# Patient Record
Sex: Male | Born: 1945 | Race: Black or African American | Hispanic: No | Marital: Single | Smoking: Never smoker
Health system: Southern US, Community
[De-identification: ages and names within clinical notes are randomized; demographics above are authoritative.]

## PROBLEM LIST (undated history)

## (undated) DIAGNOSIS — M810 Age-related osteoporosis without current pathological fracture: Secondary | ICD-10-CM

## (undated) DIAGNOSIS — D869 Sarcoidosis, unspecified: Secondary | ICD-10-CM

## (undated) DIAGNOSIS — H269 Unspecified cataract: Secondary | ICD-10-CM

## (undated) DIAGNOSIS — K449 Diaphragmatic hernia without obstruction or gangrene: Secondary | ICD-10-CM

## (undated) DIAGNOSIS — N200 Calculus of kidney: Secondary | ICD-10-CM

## (undated) DIAGNOSIS — Z9289 Personal history of other medical treatment: Secondary | ICD-10-CM

## (undated) DIAGNOSIS — R059 Cough, unspecified: Secondary | ICD-10-CM

## (undated) DIAGNOSIS — K5790 Diverticulosis of intestine, part unspecified, without perforation or abscess without bleeding: Secondary | ICD-10-CM

## (undated) DIAGNOSIS — R05 Cough: Secondary | ICD-10-CM

## (undated) DIAGNOSIS — Q5310 Unspecified undescended testicle, unilateral: Secondary | ICD-10-CM

## (undated) DIAGNOSIS — I10 Essential (primary) hypertension: Secondary | ICD-10-CM

## (undated) DIAGNOSIS — K219 Gastro-esophageal reflux disease without esophagitis: Secondary | ICD-10-CM

## (undated) DIAGNOSIS — D649 Anemia, unspecified: Secondary | ICD-10-CM

## (undated) DIAGNOSIS — E785 Hyperlipidemia, unspecified: Secondary | ICD-10-CM

## (undated) DIAGNOSIS — G5602 Carpal tunnel syndrome, left upper limb: Secondary | ICD-10-CM

## (undated) DIAGNOSIS — J310 Chronic rhinitis: Secondary | ICD-10-CM

## (undated) DIAGNOSIS — R0789 Other chest pain: Secondary | ICD-10-CM

## (undated) DIAGNOSIS — J849 Interstitial pulmonary disease, unspecified: Secondary | ICD-10-CM

## (undated) DIAGNOSIS — I4891 Unspecified atrial fibrillation: Secondary | ICD-10-CM

## (undated) DIAGNOSIS — M1990 Unspecified osteoarthritis, unspecified site: Secondary | ICD-10-CM

## (undated) DIAGNOSIS — R0600 Dyspnea, unspecified: Secondary | ICD-10-CM

## (undated) HISTORY — PX: CERVICAL FUSION: SHX112

## (undated) HISTORY — DX: Diaphragmatic hernia without obstruction or gangrene: K44.9

## (undated) HISTORY — DX: Hyperlipidemia, unspecified: E78.5

## (undated) HISTORY — DX: Unspecified cataract: H26.9

## (undated) HISTORY — DX: Sarcoidosis, unspecified: D86.9

## (undated) HISTORY — DX: Unspecified atrial fibrillation: I48.91

## (undated) HISTORY — DX: Chronic rhinitis: J31.0

## (undated) HISTORY — DX: Interstitial pulmonary disease, unspecified: J84.9

## (undated) HISTORY — DX: Unspecified osteoarthritis, unspecified site: M19.90

## (undated) HISTORY — DX: Cough: R05

## (undated) HISTORY — DX: Diverticulosis of intestine, part unspecified, without perforation or abscess without bleeding: K57.90

## (undated) HISTORY — DX: Gastro-esophageal reflux disease without esophagitis: K21.9

## (undated) HISTORY — DX: Other chest pain: R07.89

## (undated) HISTORY — DX: Age-related osteoporosis without current pathological fracture: M81.0

## (undated) HISTORY — DX: Anemia, unspecified: D64.9

## (undated) HISTORY — DX: Personal history of other medical treatment: Z92.89

## (undated) HISTORY — DX: Cough, unspecified: R05.9

## (undated) HISTORY — PX: INGUINAL HERNIA REPAIR: SUR1180

## (undated) HISTORY — DX: Carpal tunnel syndrome, left upper limb: G56.02

## (undated) HISTORY — DX: Essential (primary) hypertension: I10

## (undated) HISTORY — DX: Calculus of kidney: N20.0

## (undated) HISTORY — DX: Unspecified undescended testicle, unilateral: Q53.10

---

## 1994-11-13 HISTORY — PX: OTHER SURGICAL HISTORY: SHX169

## 1998-06-06 ENCOUNTER — Ambulatory Visit (HOSPITAL_COMMUNITY): Admission: RE | Admit: 1998-06-06 | Discharge: 1998-06-06 | Payer: Self-pay | Admitting: Neurology

## 1998-06-08 ENCOUNTER — Ambulatory Visit (HOSPITAL_COMMUNITY): Admission: RE | Admit: 1998-06-08 | Discharge: 1998-06-08 | Payer: Self-pay | Admitting: Neurology

## 1998-06-13 DIAGNOSIS — D869 Sarcoidosis, unspecified: Secondary | ICD-10-CM

## 1998-06-13 HISTORY — DX: Sarcoidosis, unspecified: D86.9

## 1998-07-07 ENCOUNTER — Ambulatory Visit: Admission: RE | Admit: 1998-07-07 | Discharge: 1998-07-07 | Payer: Self-pay | Admitting: Internal Medicine

## 1998-10-13 ENCOUNTER — Encounter: Payer: Self-pay | Admitting: Surgery

## 1998-10-13 ENCOUNTER — Ambulatory Visit (HOSPITAL_BASED_OUTPATIENT_CLINIC_OR_DEPARTMENT_OTHER): Admission: RE | Admit: 1998-10-13 | Discharge: 1998-10-13 | Payer: Self-pay | Admitting: Surgery

## 2000-01-28 ENCOUNTER — Ambulatory Visit (HOSPITAL_COMMUNITY): Admission: RE | Admit: 2000-01-28 | Discharge: 2000-01-28 | Payer: Self-pay | Admitting: Neurology

## 2000-01-28 ENCOUNTER — Encounter: Payer: Self-pay | Admitting: Neurology

## 2001-02-09 ENCOUNTER — Ambulatory Visit (HOSPITAL_COMMUNITY): Admission: RE | Admit: 2001-02-09 | Discharge: 2001-02-09 | Payer: Self-pay | Admitting: Neurology

## 2001-02-09 ENCOUNTER — Encounter: Payer: Self-pay | Admitting: Neurology

## 2002-06-02 ENCOUNTER — Ambulatory Visit (HOSPITAL_COMMUNITY): Admission: RE | Admit: 2002-06-02 | Discharge: 2002-06-02 | Payer: Self-pay | Admitting: Neurology

## 2002-06-02 ENCOUNTER — Encounter: Payer: Self-pay | Admitting: Neurology

## 2003-05-30 ENCOUNTER — Ambulatory Visit (HOSPITAL_COMMUNITY): Admission: RE | Admit: 2003-05-30 | Discharge: 2003-05-30 | Payer: Self-pay | Admitting: Neurology

## 2003-05-30 ENCOUNTER — Encounter: Payer: Self-pay | Admitting: Neurology

## 2003-12-31 ENCOUNTER — Ambulatory Visit (HOSPITAL_COMMUNITY): Admission: RE | Admit: 2003-12-31 | Discharge: 2003-12-31 | Payer: Self-pay | Admitting: Internal Medicine

## 2004-09-13 ENCOUNTER — Ambulatory Visit: Payer: Self-pay | Admitting: Internal Medicine

## 2004-09-16 ENCOUNTER — Encounter (INDEPENDENT_AMBULATORY_CARE_PROVIDER_SITE_OTHER): Payer: Self-pay | Admitting: Specialist

## 2004-09-16 ENCOUNTER — Ambulatory Visit (HOSPITAL_COMMUNITY): Admission: RE | Admit: 2004-09-16 | Discharge: 2004-09-16 | Payer: Self-pay | Admitting: Surgery

## 2004-09-16 ENCOUNTER — Ambulatory Visit (HOSPITAL_BASED_OUTPATIENT_CLINIC_OR_DEPARTMENT_OTHER): Admission: RE | Admit: 2004-09-16 | Discharge: 2004-09-16 | Payer: Self-pay | Admitting: Surgery

## 2004-09-16 HISTORY — PX: LIPOMA EXCISION: SHX5283

## 2004-12-12 ENCOUNTER — Ambulatory Visit: Payer: Self-pay | Admitting: Internal Medicine

## 2005-01-23 ENCOUNTER — Ambulatory Visit: Payer: Self-pay | Admitting: Internal Medicine

## 2005-03-07 ENCOUNTER — Ambulatory Visit: Payer: Self-pay | Admitting: Internal Medicine

## 2005-04-20 ENCOUNTER — Ambulatory Visit: Payer: Self-pay | Admitting: Internal Medicine

## 2005-06-03 ENCOUNTER — Ambulatory Visit (HOSPITAL_COMMUNITY): Admission: RE | Admit: 2005-06-03 | Discharge: 2005-06-03 | Payer: Self-pay | Admitting: Neurology

## 2005-06-06 ENCOUNTER — Ambulatory Visit: Payer: Self-pay | Admitting: Internal Medicine

## 2005-07-20 ENCOUNTER — Ambulatory Visit: Payer: Self-pay | Admitting: Internal Medicine

## 2005-08-17 ENCOUNTER — Ambulatory Visit: Payer: Self-pay | Admitting: Internal Medicine

## 2005-09-28 ENCOUNTER — Ambulatory Visit: Payer: Self-pay | Admitting: Internal Medicine

## 2005-10-30 ENCOUNTER — Ambulatory Visit: Payer: Self-pay | Admitting: Internal Medicine

## 2005-11-28 ENCOUNTER — Ambulatory Visit: Payer: Self-pay | Admitting: Internal Medicine

## 2005-12-28 ENCOUNTER — Ambulatory Visit: Payer: Self-pay | Admitting: Gastroenterology

## 2006-01-10 ENCOUNTER — Ambulatory Visit: Payer: Self-pay | Admitting: Internal Medicine

## 2006-01-12 ENCOUNTER — Ambulatory Visit: Payer: Self-pay | Admitting: Gastroenterology

## 2006-01-12 HISTORY — PX: COLONOSCOPY: SHX174

## 2006-01-23 LAB — HM COLONOSCOPY: HM Colonoscopy: NORMAL

## 2006-01-26 ENCOUNTER — Ambulatory Visit: Payer: Self-pay | Admitting: Gastroenterology

## 2006-02-13 ENCOUNTER — Ambulatory Visit: Payer: Self-pay | Admitting: Gastroenterology

## 2006-02-13 ENCOUNTER — Encounter (INDEPENDENT_AMBULATORY_CARE_PROVIDER_SITE_OTHER): Payer: Self-pay | Admitting: Specialist

## 2006-02-27 ENCOUNTER — Ambulatory Visit: Payer: Self-pay | Admitting: Internal Medicine

## 2006-03-21 ENCOUNTER — Ambulatory Visit (HOSPITAL_COMMUNITY): Admission: RE | Admit: 2006-03-21 | Discharge: 2006-03-21 | Payer: Self-pay | Admitting: Gastroenterology

## 2006-04-03 ENCOUNTER — Ambulatory Visit: Payer: Self-pay | Admitting: Internal Medicine

## 2006-04-11 ENCOUNTER — Ambulatory Visit: Payer: Self-pay | Admitting: Internal Medicine

## 2006-05-22 ENCOUNTER — Ambulatory Visit: Payer: Self-pay | Admitting: Gastroenterology

## 2006-07-02 ENCOUNTER — Ambulatory Visit: Payer: Self-pay | Admitting: Internal Medicine

## 2006-08-13 ENCOUNTER — Ambulatory Visit: Payer: Self-pay | Admitting: Internal Medicine

## 2006-09-25 ENCOUNTER — Ambulatory Visit: Payer: Self-pay | Admitting: Internal Medicine

## 2006-10-25 ENCOUNTER — Ambulatory Visit: Payer: Self-pay | Admitting: Internal Medicine

## 2006-12-06 ENCOUNTER — Ambulatory Visit: Payer: Self-pay | Admitting: Internal Medicine

## 2007-01-04 ENCOUNTER — Ambulatory Visit: Payer: Self-pay | Admitting: Internal Medicine

## 2007-04-04 ENCOUNTER — Ambulatory Visit: Payer: Self-pay | Admitting: Internal Medicine

## 2007-05-30 ENCOUNTER — Ambulatory Visit (HOSPITAL_COMMUNITY): Admission: RE | Admit: 2007-05-30 | Discharge: 2007-05-30 | Payer: Self-pay | Admitting: Neurology

## 2007-09-12 DIAGNOSIS — R059 Cough, unspecified: Secondary | ICD-10-CM | POA: Insufficient documentation

## 2007-09-12 DIAGNOSIS — D869 Sarcoidosis, unspecified: Secondary | ICD-10-CM | POA: Insufficient documentation

## 2007-09-12 DIAGNOSIS — J8410 Pulmonary fibrosis, unspecified: Secondary | ICD-10-CM | POA: Insufficient documentation

## 2007-09-12 DIAGNOSIS — R05 Cough: Secondary | ICD-10-CM

## 2007-09-12 DIAGNOSIS — J310 Chronic rhinitis: Secondary | ICD-10-CM | POA: Insufficient documentation

## 2008-04-17 ENCOUNTER — Ambulatory Visit: Payer: Self-pay | Admitting: Internal Medicine

## 2010-10-31 ENCOUNTER — Ambulatory Visit: Payer: Self-pay | Admitting: Internal Medicine

## 2010-10-31 DIAGNOSIS — M1990 Unspecified osteoarthritis, unspecified site: Secondary | ICD-10-CM | POA: Insufficient documentation

## 2010-10-31 DIAGNOSIS — I10 Essential (primary) hypertension: Secondary | ICD-10-CM

## 2010-10-31 DIAGNOSIS — D509 Iron deficiency anemia, unspecified: Secondary | ICD-10-CM

## 2010-10-31 LAB — CONVERTED CEMR LAB
ALT: 16 units/L (ref 0–53)
AST: 22 units/L (ref 0–37)
Albumin: 4.1 g/dL (ref 3.5–5.2)
BUN: 10 mg/dL (ref 6–23)
Bilirubin Urine: NEGATIVE
Chloride: 103 meq/L (ref 96–112)
Direct LDL: 163.8 mg/dL
Eosinophils Relative: 1.6 % (ref 0.0–5.0)
GFR calc non Af Amer: 94.82 mL/min (ref >60.00)
Glucose, Bld: 86 mg/dL (ref 70–99)
HCT: 35.2 % — ABNORMAL LOW (ref 39.0–52.0)
Hemoglobin: 11.3 g/dL — ABNORMAL LOW (ref 13.0–17.0)
Leukocytes, UA: NEGATIVE
Lymphs Abs: 0.9 10*3/uL (ref 0.7–4.0)
MCV: 73.8 fL — ABNORMAL LOW (ref 78.0–100.0)
Monocytes Absolute: 0.4 10*3/uL (ref 0.1–1.0)
Monocytes Relative: 9.1 % (ref 3.0–12.0)
Neutro Abs: 2.9 10*3/uL (ref 1.4–7.7)
Nitrite: NEGATIVE
Platelets: 217 10*3/uL (ref 150.0–400.0)
Potassium: 4 meq/L (ref 3.5–5.1)
Sed Rate: 11 mm/hr (ref 0–22)
Sodium: 141 meq/L (ref 135–145)
Specific Gravity, Urine: 1.02 (ref 1.000–1.030)
TSH: 0.66 microintl units/mL (ref 0.35–5.50)
Total Bilirubin: 0.9 mg/dL (ref 0.3–1.2)
Total Protein, Urine: NEGATIVE mg/dL
Total Protein: 7.6 g/dL (ref 6.0–8.3)
Transferrin: 404.5 mg/dL — ABNORMAL HIGH (ref 212.0–360.0)
VLDL: 37 mg/dL (ref 0.0–40.0)
Vitamin B-12: 281 pg/mL (ref 211–911)
WBC: 4.3 10*3/uL — ABNORMAL LOW (ref 4.5–10.5)
pH: 6.5 (ref 5.0–8.0)

## 2010-11-01 ENCOUNTER — Encounter: Payer: Self-pay | Admitting: Internal Medicine

## 2010-11-16 ENCOUNTER — Encounter: Payer: Self-pay | Admitting: Internal Medicine

## 2010-11-17 ENCOUNTER — Ambulatory Visit
Admission: RE | Admit: 2010-11-17 | Discharge: 2010-11-17 | Payer: Self-pay | Source: Home / Self Care | Attending: Internal Medicine | Admitting: Internal Medicine

## 2010-11-17 ENCOUNTER — Other Ambulatory Visit: Payer: Self-pay | Admitting: Internal Medicine

## 2010-11-17 ENCOUNTER — Telehealth: Payer: Self-pay | Admitting: Internal Medicine

## 2010-11-17 ENCOUNTER — Encounter: Payer: Self-pay | Admitting: Internal Medicine

## 2010-11-17 DIAGNOSIS — N401 Enlarged prostate with lower urinary tract symptoms: Secondary | ICD-10-CM

## 2010-11-17 DIAGNOSIS — N138 Other obstructive and reflux uropathy: Secondary | ICD-10-CM | POA: Insufficient documentation

## 2010-11-17 DIAGNOSIS — R972 Elevated prostate specific antigen [PSA]: Secondary | ICD-10-CM | POA: Insufficient documentation

## 2010-11-17 LAB — CBC WITH DIFFERENTIAL/PLATELET
Basophils Absolute: 0 10*3/uL (ref 0.0–0.1)
Basophils Relative: 0.5 % (ref 0.0–3.0)
Eosinophils Absolute: 0.1 10*3/uL (ref 0.0–0.7)
Eosinophils Relative: 2.6 % (ref 0.0–5.0)
HCT: 35.4 % — ABNORMAL LOW (ref 39.0–52.0)
Hemoglobin: 11.4 g/dL — ABNORMAL LOW (ref 13.0–17.0)
Lymphocytes Relative: 18.1 % (ref 12.0–46.0)
Lymphs Abs: 1 10*3/uL (ref 0.7–4.0)
MCHC: 32.2 g/dL (ref 30.0–36.0)
MCV: 74.2 fl — ABNORMAL LOW (ref 78.0–100.0)
Monocytes Absolute: 0.4 10*3/uL (ref 0.1–1.0)
Monocytes Relative: 8 % (ref 3.0–12.0)
Neutro Abs: 3.7 10*3/uL (ref 1.4–7.7)
Neutrophils Relative %: 70.8 % (ref 43.0–77.0)
Platelets: 221 10*3/uL (ref 150.0–400.0)
RBC: 4.77 Mil/uL (ref 4.22–5.81)
RDW: 19.1 % — ABNORMAL HIGH (ref 11.5–14.6)
WBC: 5.3 10*3/uL (ref 4.5–10.5)

## 2010-11-17 LAB — CONVERTED CEMR LAB

## 2010-11-17 LAB — PSA: PSA: 4.22 ng/mL — ABNORMAL HIGH (ref 0.10–4.00)

## 2010-12-15 NOTE — Letter (Signed)
Summary: Results Follow-up Letter  [facility]  [address] [place], [place] [address]   Phone: [phone]  Fax: [phone]    11/17/2010  [address] [place], [place]  [address]  Dear Richard Blake,   The following are the results of your recent test(s):  Test     Result     CBC       mild anemia Prostate     slightly elevated   _________________________________________________________  Please call for an appointment as directed _________________________________________________________ _________________________________________________________ _________________________________________________________  Sincerely,  Sanda Linger MD [facility]

## 2010-12-15 NOTE — Letter (Signed)
Summary: Lipid Letter  [facility]  [address] [place], [place] [address]   Phone: [phone]  Fax: [phone]    11/01/2010  Richard Blake [address] [place], [place]  [address]  Dear Richard Blake:  We have carefully reviewed your last lipid profile from  and the results are noted below with a summary of recommendations for lipid management.    Cholesterol:       260     Goal: <200 too high   HDL "good" Cholesterol:   57.84     Goal: >40 good   LDL "bad" Cholesterol:   164     Goal: <130 too high   Triglycerides:       185.0     Goal: <150 too high        TLC Diet (Therapeutic Lifestyle Change): Saturated Fats & Transfatty acids should be kept < 7% of total calories ***Reduce Saturated Fats Polyunstaurated Fat can be up to 10% of total calories Monounsaturated Fat Fat can be up to 20% of total calories Total Fat should be no greater than 25-35% of total calories Carbohydrates should be 50-60% of total calories Protein should be approximately 15% of total calories Fiber should be at least 20-30 grams a day ***Increased fiber may help lower LDL Total Cholesterol should be < 200mg /day Consider adding plant stanol/sterols to diet (example: Benacol spread) ***A higher intake of unsaturated fat may reduce Triglycerides and Increase HDL    Adjunctive Measures (may lower LIPIDS and reduce risk of Heart Attack) include: Aerobic Exercise (20-30 minutes 3-4 times a week) Limit Alcohol Consumption Weight Reduction Aspirin 75-81 mg a day by mouth (if not allergic or contraindicated) Dietary Fiber 20-30 grams a day by mouth     Current Medications: 1)    Meloxicam 15 Mg Tabs (Meloxicam)  If you have any questions, please call. We appreciate being able to work with you.   Sincerely,    [facility] [name] MD

## 2010-12-15 NOTE — Letter (Signed)
Summary: [facility]  [facility]   Imported By: Maryln Gottron 11/29/2010 10:32:18  _____________________________________________________________________  External Attachment:    Type:   Image     Comment:   External Document

## 2010-12-15 NOTE — Assessment & Plan Note (Signed)
Summary: 2 WK ROV /NWS  #   Vital Signs:  Patient profile:   65 year old male Height:      68 inches Weight:      175 pounds BMI:     26.70 O2 Sat:      97 % on Room air Temp:     98.4 degrees F oral Pulse rate:   71 / minute Pulse rhythm:   regular Resp:     16 per minute BP sitting:   150 / 98  (left arm) Cuff size:   regular  Vitals Entered By: [name] CMA (November 17, 2010 11:07 AM)  Nutrition Counseling: Patient's BMI is greater than 25 and therefore counseled on weight management options.  O2 Flow:  Room air  Primary Care Provider:  [name] MD   History of Present Illness:  Follow-Up Visit      This is a 65 year old man who presents for Follow-up visit.  The patient denies chest pain, palpitations, dizziness, syncope, edema, SOB, DOE, PND, and orthopnea.  Since the last visit the patient notes no new problems or concerns.  The patient reports taking meds as prescribed, monitoring BP, and dietary compliance.  When questioned about possible medication side effects, the patient notes none.    Preventive Screening-Counseling & Management  Alcohol-Tobacco     Alcohol drinks/day: 0     Alcohol Counseling: not indicated; patient does not drink     Smoking Status: never     Tobacco Counseling: not indicated; no tobacco use  Hep-HIV-STD-Contraception     Hepatitis Risk: no risk noted     HIV Risk: no risk noted     STD Risk: no risk noted      Sexual History:  currently monogamous.        Drug Use:  no.        Blood Transfusions:  no.    Clinical Review Panels:  Prevention   Last Colonoscopy:  Normal (01/23/2006)  Immunizations   Last Tetanus Booster:  Tdap (10/31/2010)   Last Flu Vaccine:  Fluvax 3+ (10/31/2010)   Last Pneumovax:  Historical (12/14/2000)  Lipid Management   Cholesterol:  260 (10/31/2010)   HDL (good cholesterol):  55.50 (10/31/2010)  Diabetes Management   Creatinine:  1.0 (10/31/2010)   Last Flu Vaccine:  Fluvax 3+  (10/31/2010)   Last Pneumovax:  Historical (12/14/2000)  CBC   WBC:  4.3 (10/31/2010)   RBC:  4.76 (10/31/2010)   Hgb:  11.3 (10/31/2010)   Hct:  35.2 (10/31/2010)   Platelets:  217.0 (10/31/2010)   MCV  73.8 (10/31/2010)   MCHC  32.0 (10/31/2010)   RDW  18.8 (10/31/2010)   PMN:  68.5 (10/31/2010)   Lymphs:  20.1 (10/31/2010)   Monos:  9.1 (10/31/2010)   Eosinophils:  1.6 (10/31/2010)   Basophil:  0.7 (10/31/2010)  Complete Metabolic Panel   Glucose:  86 (10/31/2010)   Sodium:  141 (10/31/2010)   Potassium:  4.0 (10/31/2010)   Chloride:  103 (10/31/2010)   CO2:  31 (10/31/2010)   BUN:  10 (10/31/2010)   Creatinine:  1.0 (10/31/2010)   Albumin:  4.1 (10/31/2010)   Total Protein:  7.6 (10/31/2010)   Calcium:  9.2 (10/31/2010)   Total Bili:  0.9 (10/31/2010)   Alk Phos:  75 (10/31/2010)   SGPT (ALT):  16 (10/31/2010)   SGOT (AST):  22 (10/31/2010)   Medications Prior to Update: 1)  Meloxicam 15 Mg Tabs (Meloxicam)  Current  Medications (verified): 1)  Meloxicam 15 Mg Tabs (Meloxicam) 2)  B-12 250 Mcg Tabs (Cyanocobalamin) .... Take 1 Tablet By Mouth Once A Day 3)  Iron 65mg  .... One By Mouth Three Times A Day With Food 4)  Azor 5-20 Mg Tabs (Amlodipine-Olmesartan) .... One By Mouth Once Daily For High Blood Pressure  Allergies (verified): No Known Drug Allergies  Past History:  Family History: Last updated: 10/31/2010 asthma in mother negative for sarcoidosis Family History of Arthritis Family History Diabetes 1st degree relative Family History Hypertension Family History of Prostate CA 1st degree relative <50  Social History: Last updated: 10/31/2010 never smoked Retired Married Never Smoked Alcohol use-no Drug use-no Regular exercise-no  Risk Factors: Alcohol Use: 0 (11/17/2010) Exercise: no (10/31/2010)  Risk Factors: Smoking Status: never (11/17/2010)  Past Medical History: Current Problems:  RHINITIS (ICD-472.0) COUGH, CHRONIC  (ICD-786.2) 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 asthma in mother negative for sarcoidosis Family History of Arthritis Family History Diabetes 1st degree relative Family History Hypertension Family History of Prostate CA 1st degree relative <50  Social History: Reviewed history from 10/31/2010 and no changes required. never smoked Retired Married Never Smoked Alcohol use-no Drug use-no Regular exercise-no  Review of Systems  The patient denies anorexia, fever, weight loss, weight gain, chest pain, syncope, dyspnea on exertion, peripheral edema, prolonged cough, headaches, hemoptysis, abdominal pain, melena, hematochezia, severe indigestion/heartburn, hematuria, suspicious skin lesions, abnormal bleeding, enlarged lymph nodes, and angioedema.   GU:  Denies decreased libido, discharge, dysuria, erectile dysfunction, genital sores, hematuria, incontinence, nocturia, urinary frequency, and urinary hesitancy. Heme:  Denies abnormal bruising, bleeding, enlarge lymph nodes, fevers, pallor, and skin discoloration.  Physical Exam  General:  alert, well-developed, well-nourished, well-hydrated, appropriate dress, normal appearance, healthy-appearing, cooperative to examination, and good hygiene.   Head:  normocephalic, atraumatic, no abnormalities observed, and no abnormalities palpated.   Eyes:  vision grossly intact, pupils equal, pupils round, pupils reactive to light, and no injection.   Mouth:  Oral mucosa and oropharynx without lesions or exudates.  Teeth in good  repair. Neck:  supple, full ROM, no masses, no thyromegaly, no thyroid nodules or tenderness, no JVD, normal carotid upstroke, no carotid bruits, no cervical lymphadenopathy, and no neck tenderness.   Lungs:  normal respiratory effort, no intercostal retractions, no accessory muscle use, normal breath sounds, no dullness, no fremitus, no crackles, and no wheezes.   Heart:  normal rate, regular rhythm, no murmur, no gallop, no rub, and no JVD.   Abdomen:  soft, non-tender, normal bowel sounds, no distention, no masses, no guarding, no rigidity, no rebound tenderness, no abdominal hernia, no inguinal hernia, no hepatomegaly, and no splenomegaly.   Rectal:  No external abnormalities noted. Normal sphincter tone. No rectal masses or tenderness. Genitalia:  uncircumcised, no hydrocele, no varicocele, no scrotal masses, no testicular masses or atrophy, no cutaneous lesions, and no urethral discharge.  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 Prostate:  no nodules, no asymmetry, no induration, and 2+ enlarged.   Msk:  normal ROM, no joint tenderness, no joint swelling, no joint warmth, no redness over joints, no joint instability, no crepitation, no muscle  atrophy, enlarged MCP joints, enlarged PIP joints, and enlarged DIP joints.   Pulses:  R and L carotid,radial,femoral,dorsalis pedis and posterior tibial pulses are full and equal bilaterally Extremities:  No clubbing, cyanosis, edema, or deformity noted with normal full range of motion of all joints.   Neurologic:  No cranial nerve deficits noted. Station and gait are normal. Plantar reflexes are down-going bilaterally. DTRs are symmetrical throughout. Sensory, motor and coordinative functions appear intact. Skin:  turgor normal, color normal, no rashes, no suspicious lesions, no ecchymoses, no petechiae, no purpura, no ulcerations, and no edema.   Cervical Nodes:  no anterior cervical adenopathy and no posterior cervical adenopathy.    Axillary Nodes:  no R axillary adenopathy and no L axillary adenopathy.   Inguinal Nodes:  no R inguinal adenopathy and no L inguinal adenopathy.   Psych:  Oriented X3, memory intact for recent and remote, normally interactive, good eye contact, not anxious appearing, not depressed appearing, not agitated, not suicidal, and not homicidal.     Impression & Recommendations:  Problem # 1:  HYPERTROPHY PROSTATE W/UR OBST & OTH LUTS (ICD-600.01) Assessment New  Orders: Venipuncture (47829) TLB-CBC Platelet - w/Differential (85025-CBCD) TLB-PSA (Prostate Specific Antigen) (84153-PSA) DRE (F6213) Prostate / PSA (Medicare) (Y8657)  Problem # 2:  ANEMIA-NOS (ICD-285.9) Assessment: Unchanged I looked at old records today and it shows that his anemia dates back to 2007 and has been stable, his small intestine biopsy was negative for celiac disease His updated medication list for this problem includes:    B-12 250 Mcg Tabs (Cyanocobalamin) .Marland Kitchen... Take 1 tablet by mouth once a day  Orders: Venipuncture (84696) TLB-CBC Platelet - w/Differential (85025-CBCD) TLB-PSA (Prostate Specific Antigen) (84153-PSA) Hemoccult Guaiac-1 spec.(in office) (82270) DRE (E9528) Prostate / PSA (Medicare) (U1324)  Problem # 3:  HYPERTENSION (ICD-401.9) Assessment: Deteriorated  His updated medication list for this problem includes:    Azor 5-20 Mg Tabs (Amlodipine-olmesartan) ..... One by mouth once daily for high blood pressure  BP today: 150/98 Prior BP: 136/88 (10/31/2010)  Labs Reviewed: K+: 4.0 (10/31/2010) Creat: : 1.0 (10/31/2010)   Chol: 260 (10/31/2010)   HDL: 55.50 (10/31/2010)   TG: 185.0 (10/31/2010)  Complete Medication List: 1)  Meloxicam 15 Mg Tabs (Meloxicam) 2)  B-12 250 Mcg Tabs (Cyanocobalamin) .... Take 1 tablet by mouth once a day 3)  Iron 65mg   .... One by mouth three times a day with food 4)  Azor 5-20 Mg Tabs (Amlodipine-olmesartan) .... One by mouth once daily for high blood  pressure  Colorectal Screening:  Current Recommendations:    Hemoccult: NEG X 1 today  Colonoscopy Results:    Date of Exam: 01/23/2006    Results: Normal  PSA Screening:    Reviewed PSA screening recommendations: PSA ordered  Immunization & Chemoprophylaxis:    Tetanus vaccine: Tdap  (10/31/2010)    Influenza vaccine: Fluvax 3+  (10/31/2010)    Pneumovax: Historical  (12/14/2000)  Patient Instructions: 1)  Please schedule a follow-up appointment in 2 months. 2)  It is important that you exercise regularly at least 20 minutes 5 times a week. If you develop chest pain, have severe difficulty breathing, or feel very tired , stop exercising immediately and seek medical attention. 3)  You need to lose weight. Consider a lower calorie diet and regular exercise.  4)  Check your Blood Pressure regularly. If it is above 140/90: you should make an appointment. Prescriptions: AZOR 5-20 MG TABS (AMLODIPINE-OLMESARTAN) one by mouth once daily for high blood  pressure  #77 x 0   Entered and Authorized by:   [name] MD   Signed by:   [name] MD on 11/17/2010   Method used:   Samples Given   RxID:   [id number]    Orders Added: 1)  Venipuncture [33295] 2)  TLB-CBC Platelet - w/Differential [85025-CBCD] 3)  TLB-PSA (Prostate Specific Antigen) [18841-YSA] 4)  Hemoccult Guaiac-1 spec.(in office) [82270] 5)  DRE [G0102] 6)  Prostate / PSA (Medicare) [G0103] 7)  Est. Patient Level IV [63016]

## 2010-12-15 NOTE — Progress Notes (Signed)
Summary: RESULTS  Phone Note Outgoing Call   Summary of Call: his psa is high Initial call taken by: [name] MD,  November 17, 2010 4:30 PM  Follow-up for Phone Call        he has been referred to a urologist Follow-up by: [name] MD,  November 17, 2010 4:31 PM  Additional Follow-up for Phone Call Additional follow up Details #1::        left mess to call office back.....................Marland KitchenLamar Sprinkles, CMA  November 18, 2010 9:28 AM   Pt informed  Additional Follow-up by: Lamar Sprinkles, CMA,  November 18, 2010 9:39 AM  New Problems: PROSTATE SPECIFIC ANTIGEN, ELEVATED (ICD-790.93)   New Problems: PROSTATE SPECIFIC ANTIGEN, ELEVATED (ICD-790.93)

## 2010-12-15 NOTE — Assessment & Plan Note (Signed)
Summary: new / [facility] / # / cd   Vital Signs:  Patient profile:   65 year old male Height:      68 inches Weight:      178.50 pounds BMI:     27.24 O2 Sat:      99 % on Room air Temp:     97.9 degrees F oral Pulse rate:   75 / minute Pulse rhythm:   regular Resp:     16 per minute BP sitting:   136 / 88  (left arm) Cuff size:   large  Vitals Entered By: [name] CMA (October 31, 2010 3:05 PM)  Nutrition Counseling: Patient's BMI is greater than 25 and therefore counseled on weight management options.  O2 Flow:  Room air  Primary Care Provider:  [name] MD   History of Present Illness: New to me he tells me that his orthopedist wants for him to have a PCP for ongoing issues. The pt. believes that there is fluid in his knees and bursa sacs that is being absorbed into his body and that he can smell it, taste it, feels it and that it has a sweet smell like a hospital disenfectant. He says his ortho. had no explanation for the symptoms and told him that he had OA.  Preventive Screening-Counseling & Management  Alcohol-Tobacco     Alcohol drinks/day: 0     Alcohol Counseling: not indicated; patient does not drink     Smoking Status: never     Tobacco Counseling: not indicated; no tobacco use  Caffeine-Diet-Exercise     Does Patient Exercise: no  Hep-HIV-STD-Contraception     Hepatitis Risk: no risk noted     HIV Risk: no risk noted     STD Risk: no risk noted      Sexual History:  currently monogamous.        Drug Use:  no.        Blood Transfusions:  no.    Problems Prior to Update: 1)  Anemia-nos  (ICD-285.9) 2)  Osteoarthritis  (ICD-715.90) 3)  Hypertension  (ICD-401.9) 4)  Rhinitis  (ICD-472.0) 5)  Cough, Chronic  (ICD-786.2) 6)  Interstitial Lung Disease  (ICD-515) 7)  Sarcoidosis  (ICD-135)  Medications Prior to Update: 1)  Prednisone 5 Mg  Tabs (Prednisone) .... Once Daily  Current Medications (verified): 1)  Meloxicam 15 Mg Tabs  (Meloxicam)  Allergies (verified): No Known Drug Allergies  Past History:  Past Medical History: Current Problems:  RHINITIS (ICD-472.0) COUGH, CHRONIC (ICD-786.2) INTERSTITIAL LUNG DISEASE (ICD-515) SARCOIDOSIS (ICD-135) diagnosed 8/99 by transbronchial biopsy, taking daily prednisone since November 08 but on and off it since 1999 with cough every time he relapses off it for more than sev weeks   Hypertension Osteoarthritis Anemia-NOS  Family History: asthma in mother negative for sarcoidosis Family History of Arthritis Family History Diabetes 1st degree relative Family History Hypertension Family History of Prostate CA 1st degree relative <50  Social History: never smoked Retired Married Never Smoked Alcohol use-no Drug use-no Regular exercise-no Hepatitis Risk:  no risk noted HIV Risk:  no risk noted STD Risk:  no risk noted Sexual History:  currently monogamous Blood Transfusions:  no Drug Use:  no Does Patient Exercise:  no  Review of Systems  The patient denies anorexia, fever, weight loss, weight gain, chest pain, syncope, dyspnea on exertion, peripheral edema, prolonged cough, headaches, hemoptysis, abdominal pain, hematuria, muscle weakness, suspicious skin lesions, difficulty walking, depression, unusual weight change,  and enlarged lymph nodes.   Resp:  Denies chest pain with inspiration, cough, coughing up blood, excessive snoring, hypersomnolence, morning headaches, pleuritic, shortness of breath, sputum productive, and wheezing. MS:  Complains of joint pain; denies joint redness, joint swelling, loss of strength, low back pain, muscle aches, muscle, cramps, muscle weakness, stiffness, and thoracic pain. Derm:  Denies changes in nail beds, dryness, flushing, hair loss, insect bite(s), itching, lesion(s), and rash. Heme:  Denies abnormal bruising, bleeding, enlarge lymph nodes, fevers, pallor, and skin discoloration.  Physical Exam  General:  alert,  well-developed, well-nourished, well-hydrated, appropriate dress, normal appearance, healthy-appearing, cooperative to examination, and good hygiene.   Head:  normocephalic, atraumatic, no abnormalities observed, and no abnormalities palpated.   Eyes:  vision grossly intact, pupils equal, pupils round, pupils reactive to light, and no injection.   Ears:  R ear normal and L ear normal.   Nose:  External nasal examination shows no deformity or inflammation. Nasal mucosa are pink and moist without lesions or exudates. Mouth:  Oral mucosa and oropharynx without lesions or exudates.  Teeth in good repair. Neck:  supple, full ROM, no masses, no thyromegaly, no thyroid nodules or tenderness, no JVD, normal carotid upstroke, no carotid bruits, no cervical lymphadenopathy, and no neck tenderness.   Lungs:  normal respiratory effort, no intercostal retractions, no accessory muscle use, normal breath sounds, no dullness, no fremitus, no crackles, and no wheezes.   Heart:  normal rate, regular rhythm, no murmur, no gallop, no rub, and no JVD.   Abdomen:  soft, non-tender, normal bowel sounds, no distention, no masses, no guarding, no rigidity, no rebound tenderness, no abdominal hernia, no inguinal hernia, no hepatomegaly, and no splenomegaly.   Msk:  normal ROM, no joint tenderness, no joint swelling, no joint warmth, no redness over joints, no joint instability, no crepitation, no muscle atrophy, enlarged MCP joints, enlarged PIP joints, and enlarged DIP joints.   Pulses:  R and L carotid,radial,femoral,dorsalis pedis and posterior tibial pulses are full and equal bilaterally Extremities:  No clubbing, cyanosis, edema, or deformity noted with normal full range of motion of all joints.   Neurologic:  No cranial nerve deficits noted. Station and gait are normal. Plantar reflexes are down-going bilaterally. DTRs are symmetrical throughout. Sensory, motor and coordinative functions appear intact. Skin:  turgor  normal, color normal, no rashes, no suspicious lesions, no ecchymoses, no petechiae, no purpura, no ulcerations, and no edema.   Cervical Nodes:  no anterior cervical adenopathy and no posterior cervical adenopathy.   Axillary Nodes:  no R axillary adenopathy and no L axillary adenopathy.   Psych:  Oriented X3, memory intact for recent and remote, normally interactive, good eye contact, not anxious appearing, not depressed appearing, not agitated, not suicidal, and not homicidal.     Impression & Recommendations:  Problem # 1:  ANEMIA-NOS (ICD-285.9) Assessment New  Orders: Venipuncture ([address]) TLB-Lipid Panel (80061-LIPID) TLB-BMP (Basic Metabolic Panel-BMET) (80048-METABOL) TLB-CBC Platelet - w/Differential (85025-CBCD) TLB-Hepatic/Liver Function Pnl (80076-HEPATIC) TLB-TSH (Thyroid Stimulating Hormone) (84443-TSH) TLB-B12 + Folate Pnl (60454_09811-B14/NWG) TLB-IBC Pnl (Iron/FE;Transferrin) (83550-IBC) TLB-CRP-High Sensitivity (C-Reactive Protein) (86140-FCRP) TLB-Sedimentation Rate (ESR) (85652-ESR) TLB-Udip w/ Micro (81001-URINE)  Problem # 2:  OSTEOARTHRITIS (ICD-715.90) Assessment: Unchanged  His updated medication list for this problem includes:    Meloxicam 15 Mg Tabs (Meloxicam)  Orders: Venipuncture (95621) TLB-Lipid Panel (80061-LIPID) TLB-BMP (Basic Metabolic Panel-BMET) (80048-METABOL) TLB-CBC Platelet - w/Differential (85025-CBCD) TLB-Hepatic/Liver Function Pnl (80076-HEPATIC) TLB-TSH (Thyroid Stimulating Hormone) (84443-TSH) TLB-B12 + Folate Pnl (30865_78469-G29/BMW) TLB-IBC Pnl (Iron/FE;Transferrin) (  83550-IBC) TLB-CRP-High Sensitivity (C-Reactive Protein) (86140-FCRP) TLB-Sedimentation Rate (ESR) (85652-ESR) TLB-Udip w/ Micro (81001-URINE)  Problem # 3:  HYPERTENSION (ICD-401.9) Assessment: Improved  Orders: Venipuncture ([address]86) TLB-Lipid Panel (80061-LIPID) TLB-BMP (Basic Metabolic Panel-BMET) (80048-METABOL) TLB-CBC Platelet - w/Differential  (85025-CBCD) TLB-Hepatic/Liver Function Pnl (80076-HEPATIC) TLB-TSH (Thyroid Stimulating Hormone) (84443-TSH) TLB-B12 + Folate Pnl (57846_96295-M84/XLK) TLB-IBC Pnl (Iron/FE;Transferrin) (83550-IBC) TLB-CRP-High Sensitivity (C-Reactive Protein) (86140-FCRP) TLB-Sedimentation Rate (ESR) (85652-ESR) TLB-Udip w/ Micro (81001-URINE)  BP today: 136/88 Prior BP: 124/80 (04/17/2008)  Problem # 4:  SARCOIDOSIS (ICD-135) Assessment: Unchanged  Orders: Venipuncture (44010) TLB-Lipid Panel (80061-LIPID) TLB-BMP (Basic Metabolic Panel-BMET) (80048-METABOL) TLB-CBC Platelet - w/Differential (85025-CBCD) TLB-Hepatic/Liver Function Pnl (80076-HEPATIC) TLB-TSH (Thyroid Stimulating Hormone) (84443-TSH) TLB-B12 + Folate Pnl (27253_66440-H47/QQV) TLB-IBC Pnl (Iron/FE;Transferrin) (83550-IBC) TLB-CRP-High Sensitivity (C-Reactive Protein) (86140-FCRP) TLB-Sedimentation Rate (ESR) (85652-ESR) TLB-Udip w/ Micro (81001-URINE)  Complete Medication List: 1)  Meloxicam 15 Mg Tabs (Meloxicam)  Patient Instructions: 1)  Please schedule a follow-up appointment in 2 weeks. 2)  It is important that you exercise regularly at least 20 minutes 5 times a week. If you develop chest pain, have severe difficulty breathing, or feel very tired , stop exercising immediately and seek medical attention. 3)  Check your Blood Pressure regularly. If it is above 140/90: you should make an appointment. 4)  Take 650-1000mg  of Tylenol every 4-6 hours as needed for relief of pain or comfort of fever AVOID taking more than 4000mg   in a 24 hour period (can cause liver damage in higher doses).   Orders Added: 1)  Venipuncture [36415] 2)  TLB-Lipid Panel [80061-LIPID] 3)  TLB-BMP (Basic Metabolic Panel-BMET) [80048-METABOL] 4)  TLB-CBC Platelet - w/Differential [85025-CBCD] 5)  TLB-Hepatic/Liver Function Pnl [80076-HEPATIC] 6)  TLB-TSH (Thyroid Stimulating Hormone) [84443-TSH] 7)  TLB-B12 + Folate Pnl [82746_82607-B12/FOL] 8)   TLB-IBC Pnl (Iron/FE;Transferrin) [83550-IBC] 9)  TLB-CRP-High Sensitivity (C-Reactive Protein) [86140-FCRP] 10)  TLB-Sedimentation Rate (ESR) [85652-ESR] 11)  TLB-Udip w/ Micro [81001-URINE] 12)  New Patient Level IV [95638]  Appended Document: new / united hc / # / cd     Clinical Lists Changes  Orders: Added new Service order of Flu Vaccine 81yrs + [phone]) - Signed Added new Service order of Admin 1st Vaccine (32951) - Signed Added new Service order of Tdap => 33yrs IM (88416) - Signed Added new Service order of Admin of Any Addtl Vaccine (60630) - Signed Observations: Added new observation of TD BOOST VIS: 09/30/08 version given October 31, 2010. (10/31/2010 15:38) Added new observation of TD BOOSTERLO: [id number] (10/31/2010 15:38) Added new observation of TD BOOST EXP: 09/01/2012 (10/31/2010 15:38) Added new observation of TD BOOSTERBY: Lakisha Archie CMA (10/31/2010 15:38) Added new observation of TD BOOSTERRT: IM (10/31/2010 15:38) Added new observation of TDBOOSTERDSE: 0.5 ml (10/31/2010 15:38) Added new observation of TD BOOSTERMF: GlaxoSmithKline (10/31/2010 15:38) Added new observation of TD BOOST SIT: right deltoid (10/31/2010 15:38) Added new observation of TD BOOSTER: Tdap (10/31/2010 15:38) Added new observation of FLU VAX#1VIS: 06/07/10 version given October 31, 2010. (10/31/2010 15:38) Added new observation of FLU VAXLOT: [id number] (10/31/2010 15:38) Added new observation of FLU VAX EXP: 05/13/2011 (10/31/2010 15:38) Added new observation of FLU VAXBY: Lakisha Archie CMA (10/31/2010 15:38) Added new observation of FLU VAXRTE: IM (10/31/2010 15:38) Added new observation of FLU VAX DSE: 0.5 ml (10/31/2010 15:38) Added new observation of FLU VAXMFR: Sanofi Pasteur (10/31/2010 15:38) Added new observation of FLU VAX SITE: left deltoid (10/31/2010 15:38) Added new observation of FLU VAX: Fluvax 3+ (10/31/2010 15:38)       Immunizations Administered:  Influenza  Vaccine # 1:    Vaccine Type: Fluvax 3+    Site: left deltoid    Mfr: Sanofi Pasteur    Dose: 0.5 ml    Route: IM    Given by: [name] CMA    Exp. Date: 05/13/2011    Lot #: [id number]    VIS given: 06/07/10 version given October 31, 2010.  Tetanus Vaccine:    Vaccine Type: Tdap    Site: right deltoid    Mfr: GlaxoSmithKline    Dose: 0.5 ml    Route: IM    Given by: [name] CMA    Exp. Date: 09/01/2012    Lot #: [id number]    VIS given: 09/30/08 version given October 31, 2010.

## 2010-12-15 NOTE — Letter (Signed)
Summary: Results Follow-up Letter  [facility]  [address] [place], [place] [address]   Phone: [phone]  Fax: [phone]    11/01/2010  [address] [place], [place]  [address]  Dear Mr. HAQ,   The following are the results of your recent test(s):  Test     Result     CBC       mild anemia Iron level     low B12 level     borderline low Liver/kidney   normal Thyroid     normal Urine       normal   _________________________________________________________  Please call for an appointment soon _________________________________________________________ _________________________________________________________ _________________________________________________________  Sincerely,  Sanda Linger MD [facility]

## 2010-12-21 ENCOUNTER — Encounter: Payer: Self-pay | Admitting: Internal Medicine

## 2010-12-21 ENCOUNTER — Ambulatory Visit (INDEPENDENT_AMBULATORY_CARE_PROVIDER_SITE_OTHER): Payer: PRIVATE HEALTH INSURANCE | Admitting: Internal Medicine

## 2010-12-21 ENCOUNTER — Other Ambulatory Visit: Payer: Self-pay | Admitting: Internal Medicine

## 2010-12-21 ENCOUNTER — Ambulatory Visit (INDEPENDENT_AMBULATORY_CARE_PROVIDER_SITE_OTHER)
Admission: RE | Admit: 2010-12-21 | Discharge: 2010-12-21 | Disposition: A | Discharge status: Home / Self Care | Payer: PRIVATE HEALTH INSURANCE | Source: Ambulatory Visit | Attending: Internal Medicine | Admitting: Internal Medicine

## 2010-12-21 DIAGNOSIS — R079 Chest pain, unspecified: Secondary | ICD-10-CM | POA: Insufficient documentation

## 2010-12-21 DIAGNOSIS — D869 Sarcoidosis, unspecified: Secondary | ICD-10-CM

## 2010-12-21 DIAGNOSIS — D649 Anemia, unspecified: Secondary | ICD-10-CM

## 2010-12-22 ENCOUNTER — Encounter: Payer: Self-pay | Admitting: Internal Medicine

## 2010-12-29 NOTE — Assessment & Plan Note (Signed)
Summary: Pulmonary/ ext eval for cp   Primary Provider/Referring Provider:  [name] MD  CC:  Cough- resolved. Pt c/o chest "spasms".  History of Present Illness: 76 yobm  never smoker diagnosed with sarcoidosis in 1999 associated with chronic interstitial lung disease and a cough that  only responded to prednisone failed to respond  to treatment directed at rhinitis and reflux suggesting sarcoid as a mechanism.  On his own  found by trial and error that every time he tapered his prednisone under 5 mg per day the cough recurred,   f/u by  Dr. 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 Stopped prednisone completely in 2009 > cough did not recur.    December 21, 2010 ov Cough remains  resolved. Pt c/o chest "spasms" x one year avg one or two per week has found that if he exerts heavily chest spasms are worse after eat or drink but not reproducible with ex per se, rather hurts sev hours after completes the exertion.  occ assoc nausea, no diaphoresis. Pt denies any significant sore throat, dysphagia, itching, sneezing,  nasal congestion or excess secretions,  fever, chills, sweats, unintended wt loss, pleuritic  cp, hempoptysis, change in activity tolerance  orthopnea pnd or leg swelling.  Pt also denies any obvious fluctuation in symptoms with weather or environmental change or other alleviating or aggravating factors.         Current Medications (verified): 1)  Meloxicam 15 Mg Tabs (Meloxicam) .Marland Kitchen.. 1 Once Daily As Needed 2)  B-12 250 Mcg Tabs (Cyanocobalamin) .... Take 1 Tablet By Mouth Once A Day 3)  Iron 65mg  .... One By Mouth Three Times A Day With Food 4)  Azor 5-20 Mg Tabs (Amlodipine-Olmesartan) .... 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 wt 175> 179 December 21, 2010  HEENT: nl dentition, turbinates, and orophanx. Nl external ear canals without cough reflex Neck without JVD/Nodes/TM Lungs clear to A and P bilaterally without cough on insp or exp maneuvers RRR no s3 or murmur or increase in P2 Abd soft and benign with nl excursion in the supine position. No bruits or organomegaly Ext warm without calf tenderness, cyanosis clubbing or edema Skin warm and dry without lesions     CXR  Procedure date:  12/21/2010  Findings:  Comparison: Chest x-ray 04/04/2007   Findings: The cardiac silhouette, mediastinal and hilar contours are stable.  Stable changes of pulmonary fibrosis consistent with known sarcoidosis.  No progressive findings and no acute overlying pulmonary process.    A moderate-to-large hiatal hernia is noted. The bony thorax is intact.   IMPRESSION:   1.  Stable changes of pulmonary fibrosis.  No acute overlying pulmonary process. 2.  Moderate-to-large hiatal hernia.  Impression & Recommendations:  Problem # 1:  CHEST  PAIN (ICD-786.50)  Atypical features with prev documented HH strongly suggest GERD but can't r/o ischemia here and would have very low threshold to proceed with cardiac w/u if not responding to GERD rx plus diet   Orders: Est. Patient Level IV ([address])  Problem # 2:  SARCOIDOSIS (ICD-135) No evidence of active dz by esr or calcium or prot/alb ratio already obtained. No further w/u needed  Problem # 3:  ANEMIA-NOS (ICD-285.9)  His updated medication list for this problem includes:    B-12 250 Mcg Tabs (Cyanocobalamin) .Marland Kitchen... Take 1 tablet by mouth once a day  Microcytic anemia with FeSats 4% suggests chronic occult GIB, defer to Dr Yetta Barre  Orders: Est. Patient Level IV ([address])  Medications Added to Medication List This Visit: 1)  Meloxicam 15 Mg Tabs (Meloxicam) .Marland Kitchen.. 1 once daily as needed 2)  Nexium 40 Mg Cpdr (Esomeprazole magnesium) .... By mouth daily. take one half hour before eating. 3)  Pepcid 20 Mg Tabs (Famotidine) .... Take one by mouth at bedtime  Other Orders: T-2 View CXR (71020TC)  Patient Instructions: 1)  Nexium 40 mg Take  one 30-60 min before first meal of the day and pepcid at bedtime 2)  GERD (REFLUX)  is a common cause of respiratory symptoms. It commonly presents without heartburn and can be treated with medication, but also with lifestyle changes including avoidance of late meals, excessive alcohol, smoking cessation, and avoid fatty foods, chocolate, peppermint, colas, red wine, and acidic juices such as orange juice. NO MINT OR MENTHOL PRODUCTS SO NO COUGH DROPS  3)  USE SUGARLESS CANDY INSTEAD (jolley ranchers)  4)  NO OIL BASED VITAMINS  5)  If chest pains worsen on this regimen you will need an expedient cardiac workup - please call us to arrange this or go to ER  if pain is sustained Prescriptions: PEPCID 20 MG TABS (FAMOTIDINE) take one by mouth at bedtime  #34 x 3   Entered and Authorized by:   Nyoka Cowden MD   Signed by:   Nyoka Cowden MD on  12/21/2010   Method used:   Electronically to        [facility]  [address] [id number]* (retail)       [address]       [place], [place]  [address]       Ph: [phone] or [phone]       Fax: [phone]   RxID:   [id number] NEXIUM 40 MG  CPDR (ESOMEPRAZOLE MAGNESIUM) By mouth daily. Take one half hour before eating.  #34 x 3   Entered and Authorized by:   Nyoka Cowden MD   Signed by:   Nyoka Cowden MD on 12/21/2010   Method used:   Electronically to        [facility]  [address] [id number]* (retail)       [address]       [employer]  [place], [place]  [address]       Ph: [phone] or [phone]       Fax: [phone]   RxID:   [id number]    CXR  Procedure date:  12/21/2010  Findings:      Comparison: Chest x-ray 04/04/2007   Findings: The cardiac silhouette, mediastinal and hilar contours are stable.  Stable changes of pulmonary fibrosis consistent with known sarcoidosis.  No progressive findings and no acute overlying pulmonary process.    A moderate-to-large hiatal hernia is noted. The bony thorax is intact.   IMPRESSION:   1.  Stable changes of pulmonary fibrosis.  No acute overlying pulmonary process. 2.  Moderate-to-large hiatal hernia.

## 2011-01-16 ENCOUNTER — Encounter: Payer: Self-pay | Admitting: Internal Medicine

## 2011-01-17 ENCOUNTER — Ambulatory Visit (INDEPENDENT_AMBULATORY_CARE_PROVIDER_SITE_OTHER): Payer: PRIVATE HEALTH INSURANCE | Admitting: Internal Medicine

## 2011-01-17 ENCOUNTER — Other Ambulatory Visit: Payer: Self-pay | Admitting: Internal Medicine

## 2011-01-17 ENCOUNTER — Encounter: Payer: Self-pay | Admitting: Internal Medicine

## 2011-01-17 ENCOUNTER — Other Ambulatory Visit: Payer: PRIVATE HEALTH INSURANCE

## 2011-01-17 DIAGNOSIS — I10 Essential (primary) hypertension: Secondary | ICD-10-CM

## 2011-01-17 DIAGNOSIS — N401 Enlarged prostate with lower urinary tract symptoms: Secondary | ICD-10-CM

## 2011-01-17 DIAGNOSIS — R972 Elevated prostate specific antigen [PSA]: Secondary | ICD-10-CM

## 2011-01-17 DIAGNOSIS — D649 Anemia, unspecified: Secondary | ICD-10-CM

## 2011-01-17 LAB — BASIC METABOLIC PANEL
CO2: 31 mEq/L (ref 19–32)
Calcium: 9.6 mg/dL (ref 8.4–10.5)
Creatinine, Ser: 1.2 mg/dL (ref 0.4–1.5)
GFR: 79.31 mL/min (ref >60.00)
Sodium: 141 mEq/L (ref 135–145)

## 2011-01-17 LAB — URINALYSIS, ROUTINE W REFLEX MICROSCOPIC
Bilirubin Urine: NEGATIVE
Hgb urine dipstick: NEGATIVE
Nitrite: NEGATIVE
Total Protein, Urine: NEGATIVE
Urine Glucose: NEGATIVE
Urobilinogen, UA: 0.2 (ref 0.0–1.0)

## 2011-01-17 LAB — CBC WITH DIFFERENTIAL/PLATELET
Basophils Relative: 0.4 % (ref 0.0–3.0)
Eosinophils Absolute: 0.1 10*3/uL (ref 0.0–0.7)
Eosinophils Relative: 3.4 % (ref 0.0–5.0)
HCT: 42.4 % (ref 39.0–52.0)
Lymphs Abs: 1 10*3/uL (ref 0.7–4.0)
MCHC: 33.5 g/dL (ref 30.0–36.0)
MCV: 79.5 fl (ref 78.0–100.0)
Monocytes Absolute: 0.3 10*3/uL (ref 0.1–1.0)
RBC: 5.34 Mil/uL (ref 4.22–5.81)
WBC: 4.2 10*3/uL — ABNORMAL LOW (ref 4.5–10.5)

## 2011-01-17 LAB — IBC PANEL: Saturation Ratios: 23.5 % (ref 20.0–50.0)

## 2011-01-24 NOTE — Assessment & Plan Note (Signed)
Summary: 2 MO FU /  NWS#   Vital Signs:  Patient profile:   65 year old male Height:      68 inches Weight:      177 pounds BMI:     27.01 O2 Sat:      97 % on Room air Temp:     98.4 degrees F oral Pulse rate:   71 / minute Pulse rhythm:   regular Resp:     16 per minute BP sitting:   140 / 86  (left arm) Cuff size:   large  Vitals Entered By: [name] CMA (January 17, 2011 9:28 AM)  Nutrition Counseling: Patient's BMI is greater than 25 and therefore counseled on weight management options.  O2 Flow:  Room air CC: follow-up visit Is Patient Diabetic? No Pain Assessment Patient in pain? no        Primary Care Provider:  [name] MD  CC:  follow-up visit.  History of Present Illness:  Follow-Up Visit      This is a 65 year old man who presents for Follow-up visit.  The patient denies chest pain, palpitations, dizziness, syncope, edema, SOB, DOE, PND, and orthopnea.  Since the last visit the patient notes no new problems or concerns.  The patient reports taking meds as prescribed, monitoring BP, and dietary compliance.  When questioned about possible medication side effects, the patient notes none.    Preventive Screening-Counseling & Management  Alcohol-Tobacco     Alcohol drinks/day: 0     Alcohol Counseling: not indicated; patient does not drink     Smoking Status: never     Tobacco Counseling: not indicated; no tobacco use  Hep-HIV-STD-Contraception     Hepatitis Risk: no risk noted     HIV Risk: no risk noted     STD Risk: no risk noted      Sexual History:  currently monogamous.        Drug Use:  no.        Blood Transfusions:  no.    Clinical Review Panels:  Prevention   Last Colonoscopy:  Normal (01/23/2006)   Last PSA:  4.22 (11/17/2010)  Immunizations   Last Tetanus Booster:  Tdap (10/31/2010)   Last Flu Vaccine:  Fluvax 3+ (10/31/2010)   Last Pneumovax:  Historical (12/14/2000)  Lipid Management   Cholesterol:  260  (10/31/2010)   HDL (good cholesterol):  55.50 (10/31/2010)  Diabetes Management   Creatinine:  1.0 (10/31/2010)   Last Flu Vaccine:  Fluvax 3+ (10/31/2010)   Last Pneumovax:  Historical (12/14/2000)  CBC   WBC:  5.3 (11/17/2010)   RBC:  4.77 (11/17/2010)   Hgb:  11.4 (11/17/2010)   Hct:  35.4 (11/17/2010)   Platelets:  221.0 (11/17/2010)   MCV  74.2 (11/17/2010)   MCHC  32.2 (11/17/2010)   RDW  19.1 (11/17/2010)   PMN:  70.8 (11/17/2010)   Lymphs:  18.1 (11/17/2010)   Monos:  8.0 (11/17/2010)   Eosinophils:  2.6 (11/17/2010)   Basophil:  0.5 (11/17/2010)  Complete Metabolic Panel   Glucose:  86 (10/31/2010)   Sodium:  141 (10/31/2010)   Potassium:  4.0 (10/31/2010)   Chloride:  103 (10/31/2010)   CO2:  31 (10/31/2010)   BUN:  10 (10/31/2010)   Creatinine:  1.0 (10/31/2010)   Albumin:  4.1 (10/31/2010)   Total Protein:  7.6 (10/31/2010)   Calcium:  9.2 (10/31/2010)   Total Bili:  0.9 (10/31/2010)   Alk Phos:  75 (  10/31/2010)   SGPT (ALT):  16 (10/31/2010)   SGOT (AST):  22 (10/31/2010)   Medications Prior to Update: 1)  Meloxicam 15 Mg Tabs (Meloxicam) .Marland Kitchen.. 1 Once Daily As Needed 2)  B-12 250 Mcg Tabs (Cyanocobalamin) .... Take 1 Tablet By Mouth Once A Day 3)  Iron 65mg  .... One By Mouth Three Times A Day With Food 4)  Azor 5-20 Mg Tabs (Amlodipine-Olmesartan) .... One By Mouth Once Daily For High Blood Pressure 5)  Nexium 40 Mg  Cpdr (Esomeprazole Magnesium) .... By Mouth Daily. Take One Half Hour Before Eating. 6)  Pepcid 20 Mg Tabs (Famotidine) .... Take One By Mouth At Bedtime  Current Medications (verified): 1)  Meloxicam 15 Mg Tabs (Meloxicam) .Marland Kitchen.. 1 Once Daily As Needed 2)  B-12 250 Mcg Tabs (Cyanocobalamin) .... Take 1 Tablet By Mouth Once A Day 3)  Iron 65mg  .... One By Mouth Three Times A Day With Food 4)  Azor 5-20 Mg Tabs (Amlodipine-Olmesartan) .... One By Mouth Once Daily For High Blood Pressure 5)  Pepcid 20 Mg Tabs (Famotidine) .... Take One By  Mouth At Bedtime 6)  Prilosec 20 Mg Cpdr (Omeprazole) .... 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 Hypertension Osteoarthritis Anemia     - Microcytic with Fe Sat 4% 10/31/10 Atypical CP HH     - See EGD 02/13/2006....................................................Christella Hartigan  Past Surgical History: Last updated: 11/17/2010 Cervical fusion  Family History: Last updated: 10/31/2010 asthma in mother negative for sarcoidosis Family History of Arthritis Family History Diabetes 1st degree relative Family History Hypertension Family History of Prostate CA 1st degree relative <50  Social History: Last updated: 10/31/2010 never smoked Retired Married Never Smoked Alcohol use-no Drug use-no Regular exercise-no  Risk Factors: Alcohol Use: 0 (01/17/2011) Exercise: no (10/31/2010)  Risk Factors: Smoking Status: never (01/17/2011)  Family History: Reviewed history from 10/31/2010 and no changes required. asthma in mother negative for sarcoidosis Family History of Arthritis Family History Diabetes 1st degree relative Family History Hypertension Family History of Prostate CA 1st degree relative <50  Social History: Reviewed history from 10/31/2010 and no changes required. never smoked Retired Married Never Smoked Alcohol use-no Drug use-no Regular exercise-no  Review of Systems  The patient denies anorexia, fever, weight loss, weight gain, chest pain, syncope, dyspnea on exertion, peripheral edema, prolonged cough, headaches,  hemoptysis, abdominal pain, melena, hematochezia, severe indigestion/heartburn, hematuria, genital sores, suspicious skin lesions, difficulty walking, depression, and enlarged lymph nodes.   Resp:  Denies chest discomfort, chest pain with inspiration, cough, coughing up blood, excessive snoring, hypersomnolence, morning headaches, pleuritic, shortness of breath, sputum productive, and wheezing. GU:  Denies decreased libido, discharge, dysuria, erectile dysfunction, genital sores, hematuria, incontinence, nocturia, urinary frequency, and urinary hesitancy.  Physical Exam  General:  alert, well-developed, well-nourished, well-hydrated, appropriate dress, normal appearance, healthy-appearing, cooperative to examination, and good hygiene.   Head:  normocephalic, atraumatic, no abnormalities observed, and no abnormalities palpated.   Mouth:  Oral mucosa and oropharynx without lesions or exudates.  Teeth in good repair. Neck:  supple, full ROM, no masses, no thyromegaly, no thyroid nodules or tenderness, no JVD, normal  carotid upstroke, no carotid bruits, no cervical lymphadenopathy, and no neck tenderness.   Lungs:  normal respiratory effort, no intercostal retractions, no accessory muscle use, normal breath sounds, no dullness, no fremitus, no crackles, and no wheezes.   Heart:  normal rate, regular rhythm, no murmur, no gallop, no rub, and no JVD.   Abdomen:  soft, non-tender, normal bowel sounds, no distention, no masses, no guarding, no rigidity, no rebound tenderness, no abdominal hernia, no inguinal hernia, no hepatomegaly, and no splenomegaly.   Msk:  normal ROM, no joint tenderness, no joint swelling, no joint warmth, no redness over joints, no joint instability, no crepitation, no muscle atrophy, enlarged MCP joints, enlarged PIP joints, and enlarged DIP joints.   Pulses:  R and L carotid,radial,femoral,dorsalis pedis and posterior tibial pulses are full and equal bilaterally Extremities:  No  clubbing, cyanosis, edema, or deformity noted with normal full range of motion of all joints.   Neurologic:  No cranial nerve deficits noted. Station and gait are normal. Plantar reflexes are down-going bilaterally. DTRs are symmetrical throughout. Sensory, motor and coordinative functions appear intact. Skin:  turgor normal, color normal, no rashes, no suspicious lesions, no ecchymoses, no petechiae, no purpura, no ulcerations, and no edema.   Cervical Nodes:  no anterior cervical adenopathy and no posterior cervical adenopathy.   Axillary Nodes:  no R axillary adenopathy and no L axillary adenopathy.   Psych:  Oriented X3, memory intact for recent and remote, normally interactive, good eye contact, not anxious appearing, not depressed appearing, not agitated, not suicidal, and not homicidal.     Impression & Recommendations:  Problem # 1:  PROSTATE SPECIFIC ANTIGEN, ELEVATED (ICD-790.93) Assessment Improved  Orders: Venipuncture (36644) TLB-BMP (Basic Metabolic Panel-BMET) (80048-METABOL) TLB-PSA (Prostate Specific Antigen) (84153-PSA) TLB-Udip w/ Micro (81001-URINE) TLB-CBC Platelet - w/Differential (85025-CBCD)  Problem # 2:  ANEMIA-NOS (ICD-285.9) Assessment: Improved  His updated medication list for this problem includes:    B-12 250 Mcg Tabs (Cyanocobalamin) .Marland Kitchen... Take 1 tablet by mouth once a day  Orders: Venipuncture (03474) TLB-BMP (Basic Metabolic Panel-BMET) (80048-METABOL) TLB-PSA (Prostate Specific Antigen) (84153-PSA) TLB-Udip w/ Micro (81001-URINE) TLB-CBC Platelet - w/Differential (85025-CBCD) TLB-B12 + Folate Pnl (25956_38756-E33/IRJ) TLB-IBC Pnl (Iron/FE;Transferrin) (83550-IBC)  Hgb: 11.4 (11/17/2010)   Hct: 35.4 (11/17/2010)   Platelets: 221.0 (11/17/2010) RBC: 4.77 (11/17/2010)   RDW: 19.1 (11/17/2010)   WBC: 5.3 (11/17/2010) MCV: 74.2 (11/17/2010)   MCHC: 32.2 (11/17/2010) Iron: 26 (10/31/2010)   % Sat: 4.6 (10/31/2010) B12: 281 (10/31/2010)   Folate: 5.6  (10/31/2010)   TSH: 0.66 (10/31/2010)  Problem # 3:  HYPERTENSION (ICD-401.9) Assessment: Improved  His updated medication list for this problem includes:    Azor 5-20 Mg Tabs (Amlodipine-olmesartan) ..... One by mouth once daily for high blood pressure  Orders: Venipuncture (18841) TLB-BMP (Basic Metabolic Panel-BMET) (80048-METABOL) TLB-PSA (Prostate Specific Antigen) (84153-PSA) TLB-Udip w/ Micro (81001-URINE) TLB-CBC Platelet - w/Differential (85025-CBCD)  BP today: 140/86 Prior BP: 130/80 (12/21/2010)  Labs Reviewed: K+: 4.0 (10/31/2010) Creat: : 1.0 (10/31/2010)   Chol: 260 (10/31/2010)   HDL: 55.50 (10/31/2010)   TG: 185.0 (10/31/2010)  Complete Medication List: 1)  Meloxicam 15 Mg Tabs (Meloxicam) .Marland Kitchen.. 1 once daily as needed 2)  B-12 250 Mcg Tabs (Cyanocobalamin) .... Take 1 tablet by mouth once a day 3)  Iron 65mg   .... One by mouth three times a day with food 4)  Azor 5-20 Mg Tabs (Amlodipine-olmesartan) .... One by mouth once daily for high blood pressure 5)  Pepcid 20 Mg Tabs (  Famotidine) .... Take one by mouth at bedtime 6)  Prilosec 20 Mg Cpdr (Omeprazole) .... Take one tablet before meals  Patient Instructions: 1)  Please schedule a follow-up appointment in 4 months. 2)  Avoid foods high in acid (tomatoes, citrus juices, spicy foods). Avoid eating within two hours of lying down or before exercising. Do not over eat; try smaller more frequent meals. Elevate head of bed twelve inches when sleeping. 3)  It is important that you exercise regularly at least 20 minutes 5 times a week. If you develop chest pain, have severe difficulty breathing, or feel very tired , stop exercising immediately and seek medical attention. 4)  Check your Blood Pressure regularly. If it is above 130/80: you should make an appointment. Prescriptions: AZOR 5-20 MG TABS (AMLODIPINE-OLMESARTAN) one by mouth once daily for high blood pressure  #105 x 0   Entered and Authorized by:   [name] MD   Signed by:   [name] MD on 01/17/2011   Method used:   Samples Given   RxID:   [id number]    Orders Added: 1)  Venipuncture [36415] 2)  TLB-BMP (Basic Metabolic Panel-BMET) [80048-METABOL] 3)  TLB-PSA (Prostate Specific Antigen) [84153-PSA] 4)  TLB-Udip w/ Micro [81001-URINE] 5)  TLB-CBC Platelet - w/Differential [85025-CBCD] 6)  TLB-B12 + Folate Pnl [82746_82607-B12/FOL] 7)  TLB-IBC Pnl (Iron/FE;Transferrin) [83550-IBC] 8)  Est. Patient Level IV [62130]

## 2011-01-24 NOTE — Letter (Signed)
Summary: BP Readings/Patient  BP Readings/Patient   Imported By: Sherian Rein 01/19/2011 12:32:15  _____________________________________________________________________  External Attachment:    Type:   Image     Comment:   External Document

## 2011-03-28 NOTE — Assessment & Plan Note (Signed)
[facility]                             PULMONARY OFFICE NOTE   Richard Blake, Richard Blake                     MRN:          [id number]  DATE:04/04/2007                            DOB:          February 27, 1946    PULMONARY/FOLLOWUP OFFICE VISIT   HISTORY:  A 65 year old black male with sarcoid since 1999 associated  with interstitial lung disease and a chronic cough that definitely  improved with prednisone and failed to respond to treatment directly for  rhinitis and reflux.  His prednisone was completely eliminated in  November, and he has put up with intermittent coughing since then that  seems a little bit worse the last several months, but actually has not  even used over-the-counter medications for it.  He denies any excess  sputum production, fevers, chills, sweats, obvious articular or ocular  complaints.  No skin rash, fevers, chills, sweats, or dyspnea, or  unintended weight loss.   PHYSICAL EXAMINATION:  He is a robust, pleasant, ambulatory black male  in no acute distress.  VITAL SIGNS:  Stable vital signs.  Weight of 174 pounds.  Blood pressure  120/80.  HEENT:  Unremarkable.  Oropharynx is clear.  LUNGS:  Fields are clear bilaterally to auscultation and percussion.  CARDIAC:  Regular rate and rhythm without murmur, gallop, or rub.  ABDOMEN:  Soft and benign.  EXTREMITIES:  Warm without calf tenderness, cyanosis, clubbing, or  edema.  There was no rash or erythema nodosum.   CHEST X-RAY:  Today showed minimal scarring.  Certainly no evolution of  any infiltrates off of prednisone, since prednisone was stopped in  November by serial comparison.   IMPRESSION:  No clinical evidence of active sarcoid, other than the  cough, which by trial and error in the past was still responsive and  probably did represent sarcoid involvement.  At this point, however, the  cough is a minor nuisance, and the patient declined to consider taking  prednisone for  this.  Therefore. I recommended he at least try Delsym 2  teaspoons every 12 hours, and if the cough gets aggravating enough to  want to do something about it, then I would be happy to see him back  here sooner.  Otherwise, we will follow him up in 3 months.     [name]. [name], MD, [facility]  Electronically Signed    MBW/MedQ  DD: 04/04/2007  DT: 04/04/2007  Job #: [id number]   cc:   Genene Churn. Love, M.D.

## 2011-03-31 NOTE — Assessment & Plan Note (Signed)
[facility]                             PULMONARY OFFICE NOTE   NAME:[name], Blake                     MRN:          [id number]  DATE:10/25/2006                            DOB:          27-May-1946    HISTORY:  This is a pulmonary/follow up office visit.  A 65 year old  black male with chronic sarcoid on prednisone since January 2006 for  symptoms of cough with interstitial lung disease and moderate elevation  of ACE.  He returns now feeling the best he has felt in a long time,  having stopped prednisone on November 12 with no significant cough or  dyspnea.  He has minimal aches and pains controlled with p.r.n. Tylenol.   MEDICATIONS:  Maxzide - 25  1/2 q. a.m.   PHYSICAL EXAMINATION:  He is a pleasant ambulatory black man in no acute  distress who appears minimally cushingoid.  VITAL SIGNS:  His weight is 179 pounds.  No change in baseline. Blood  pressure 120/74.  HEENT:  Unremarkable.  Pharynx is clear.  LUNGS:  Lung fields reveal minimal coarse breath sounds, adequate air  movement bilaterally.  HEART:  There is a regular rate and rhythm without murmur, gallop or  rub.  ABDOMEN:  Soft, benign.  EXTREMITIES:  Warm without calf tenderness, cyanosis, clubbing or edema.   Heme saturation 99% on room air.   Chest x-ray shows mild reduction in lung volumes.  Coarse reticular  nodular changes but no evolution from previous x-rays.   IMPRESSION:  No evidence of active sarcoid, having stopped prednisone  now a month ago.  The aches and pains he is having are not unusual and  are usually controlled on Tylenol.   Chart review indicates that he had an iron deficiency anemia, had a  microcytic anemia that needs followup.  Status post negative  gastrointestinal workup by Dr. Christella Hartigan in July.  We will take care of  that when he returns in 1 month for a follow up pulmonary function tests  and chest x-ray, again off of steroids.     [name]. [name],  MD, [facility]  Electronically Signed    MBW/MedQ  DD: 10/25/2006  DT: 10/25/2006  Job #: [id number]

## 2011-03-31 NOTE — Assessment & Plan Note (Signed)
[facility]                               PULMONARY OFFICE NOTE   NAME:[name], Richard Blake                     MRN:          [id number]  DATE:07/02/2006                            DOB:          [birth year]    HISTORY:  A 65 year old black male with steroid-dependent sarcoid,  manifested mostly by cough and on chronic steroids according to my note  since 2001.  He has been able to titrate down to 5-mg tablets, 1/2 q. day  with no flare up of cough, dyspnea, chest pain, overt sinus symptoms, rash,  myalgias, arthralgias, ocular complaints, fevers, chills, unintended weight  loss.   MEDICINES:  Reviewed with the patient in detail.  Inventory of the column  dated July 02, 2006 note that he is on Maxzide - 25 1/2 q.a.m. for blood  pressure which affords some protection against calcium wasting.   PHYSICAL EXAMINATION:  GENERAL:  This is a pleasant ambulatory black man who  appears minimally cushingoid.  VITAL SIGNS:  He has afebrile vital signs.  HEENT:  Unremarkable.  Pharynx clear.  LUNGS:  Clear bilateral to auscultation and percussion with no cough listed  on inspiratory, expiratory maneuvers.  HEART:  Regular rhythm without murmur, gallop, rub.  ABDOMEN:  Soft, benign.  EXTREMITIES:  Warm without calf tenderness, cyanosis, clubbing or edema.   IMPRESSION:  No evidence of active sarcoid on present regimen.  Therefore, I  am going to use the strategy of the lowest dose of prednisone that works as  the right dose.  Looking for any evidence for adrenal insufficiency or  worsening cough (main manifestation of sarcoid) acidosis reduced to 2.5 mg  every other day.  If he has any problem, he is to resume the 2.5 mg daily  dosing as before, as his floor.   FOLLOWUP:  Followup will be in 6 weeks, with a chest x-ray for baseline, and  at that point consider taking him off prednisone completely.   I understand he has undergone a bone density by Dr. Sandria Manly and  would be  interested in seeing a copy of this prior to considering therapy.  The  thiazide diuretic does support some protection, as does male gender, against  worsening osteoporosis and steroids.  However, he may well become a  candidate for Actonel, especially if I cannot get the prednisone tapered  completely off  successfully.  At this point, however, he is at physiologic dosing, and we  will see if his adrenal  glands can take the place of the 2.5 mg per day that he is receiving by  slowly tapering the medicine off.                                  [name]. Richard Sires, MD, [facility]   MBW/MedQ  DD:  07/02/2006  DT:  07/02/2006  Job #:  [id number]

## 2011-03-31 NOTE — Assessment & Plan Note (Signed)
[facility]                               PULMONARY OFFICE NOTE   NAME:Richard Blake, Richard Blake                     MRN:          [id number]  DATE:08/13/2006                            DOB:          [birth year]    HISTORY:  A 65 year old black male on prednisone continuously since January  of 2006 for control of cough attributed to sarcoid with interstitial lung  disease status post transbronchial biopsy back in 1999 establishing sarcoid  as a tissue diagnosis.   The patient is only on 5 mg 1/2 prednisone every other day and having no  trouble with his current cough, sinus complaints, fevers, chills, sweats,  ocular, articular symptoms or rash. He also takes Maxzide-25 1/2 q.a.m. and  Cardizem 240 one daily.   PHYSICAL EXAMINATION:  GENERAL:  He is a pleasant, ambulatory, slightly  cushingoid, black male in no acute distress.  VITAL SIGNS:  He had stable vital signs. Blood pressure 120/72.  HEENT:  Unremarkable. Pharynx clear.  LUNGS:  Fields clear bilaterally to auscultation and percussion.  CARDIOVASCULAR:  Regular rate and rhythm without murmurs, rubs or gallops.  ABDOMEN:  Soft and benign.  EXTREMITIES:  Warm without calf tenderness, cyanosis, clubbing or edema.   Oxygen saturation 100% on room air.   IMPRESSION:  Sarcoids under excellent control on physiologic doses of  prednisone and should allow [place] to reduce the dose to every third day, that  is 5 mg tablets, 1/2 every third day looking for any exacerbation of  coughing or nausea (which might indicate adrenal insufficiency) on the days  he misses the dose. I am encouraged at this point that he should be able to  continue to taper the prednisone completely off by his next visit in 6  weeks.            ______________________________  [name] [name], MD, [facility]      MBW/MedQ  DD:  08/13/2006  DT:  08/14/2006  Job #:  [id number]

## 2011-03-31 NOTE — Op Note (Signed)
Richard Blake, Richard Blake              ACCOUNT NO.:  [id number]   MEDICAL RECORD NO.:  [id number]          PATIENT TYPE:  AMB   LOCATION:  ENDO                         FACILITY:  [facility]   PHYSICIAN:  Iva Boop, M.D. LHCDATE OF BIRTH:  May 18, 1946   DATE OF PROCEDURE:  03/21/2006  DATE OF DISCHARGE:  03/21/2006                                 OPERATIVE REPORT   PROCEDURE:  Small bowel capsule endoscopy.   ORDERING PHYSICIAN:  Dr. Wendall Papa.   INDICATIONS:  A 65 year old man with anemia and low ferritin with  unrevealing upper GI endoscopy and colonoscopy.  He has iron-deficiency  anemia of unclear cause at this time.   PROCEDURE DATA:  Patient's height is 68 inches.  Weight 175 pounds.  Waist  34 inches with a normal build.   The gastric passage time was 13 minutes.  Small bowel passage time was 2  hours, 16 minutes.  These were normal.   FINDINGS:  This is a completed exam in which the capsule reached the colon.   There is one small erosion in the mid small bowel and one AVM, which is  small at 1 hour, 23 minutes, probably in the jejunum.  It could be in the  ileum.  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  This is a  normal variant, essentially.   RECOMMENDATIONS/PLANS:  It is possible that these findings are related to  his anemia but not clear.  He certainly could have chronic occult  gastrointestinal blood loss.  Plan is per Dr. Christella Hartigan.      Iva Boop, M.D. [facility]  Electronically Signed     CEG/MEDQ  D:  03/28/2006  T:  03/28/2006  Job:  [id number]   cc:   Rachael Fee, M.D.

## 2011-03-31 NOTE — Assessment & Plan Note (Signed)
[facility]                           GASTROENTEROLOGY OFFICE NOTE   ASCENCION, COYE                     MRN:          [id number]  DATE:05/22/2006                            DOB:          05/09/1946    PRIMARY CARE PHYSICIAN:  Dr. Margrett Rud   PULMONOLOGIST:  Dr. Sandrea Hughs   GI PROBLEM LIST:  1.  Iron deficiency anemia.  Hemoglobin 11.9, 12 in December 2006.      Colonoscopy performed on March 2007 showed diverticulosis, otherwise      normal.  EGD April 2007:  Small hiatal hernia, otherwise normal.      Biopsies of small bowel did not show celiac sprue.  Capsule study May      2007 showed one small bowel arteriovenous malformation, but was      otherwise normal.   INTERVAL HISTORY:  Richard Blake was originally sent to me for his colonoscopy  due to his iron deficiency anemia (iron level 24, TIBC elevated).  The work-  up was summarized as above and was essentially unrevealing.  His hemoglobin  was not too low, specifically 11.9 in December 2006.  He has never had any  signs of overt bleeding and has never required blood transfusion.   CURRENT MEDICATIONS:  Prednisone, Cardizem, Maxzide.   PHYSICAL EXAMINATION:  VITAL SIGNS:  Weight 179 pounds, blood pressure  118/84, pulse 76.  CONSTITUTIONAL:  Generally well-appearing.  NEUROLOGIC:  Alert and oriented x3.  LUNGS:  Clear to auscultation bilaterally.  HEART:  Regular rate and rhythm.  ABDOMEN:  Soft, nontender, nondistended.  Normal bowel sounds.  EXTREMITIES:  No lower extremity edema.   ASSESSMENT AND PLAN:  A 65 year old man with mild iron deficiency anemia,  negative gastrointestinal work-up.   Given the extensive gastrointestinal work-up summarized above and his  relatively mild anemia with hemoglobin 11.9 I see no reason to do any  further testing at this point unless he has more overt signs of  gastrointestinal bleeding or becomes more progressively anemic.  I will  have  a  CBC checked today and if that is worse will consider further work-up but if  it is around the same level as it was in December will likely start him on  one iron tablet a day and follow him clinically.                                     Rachael Fee, MD   DPJ/MedQ  DD:  05/22/2006  DT:  05/22/2006  Job #:  [id number]   cc:   [name]. [name], MD, [facility] Bollman  Quita Skye. Artis Flock, MD

## 2011-03-31 NOTE — Op Note (Signed)
NAMEDONTREL, SMETHERS              ACCOUNT NO.:  [id number]   MEDICAL RECORD NO.:  [id number]          PATIENT TYPE:  AMB   LOCATION:  [facility]                          FACILITY:  [facility]   PHYSICIAN:  Sandria Bales. Ezzard Standing, M.D.  DATE OF BIRTH:  11-Nov-1946   DATE OF PROCEDURE:  09/16/2004  DATE OF DISCHARGE:                                 OPERATIVE REPORT   PREOPERATIVE DIAGNOSIS:  Mass, left forearm, about 6 to 7 mm in size,  subcuticular.   POSTOPERATIVE DIAGNOSIS:  Mass, left forearm, 6 to 7 mm in size.   PROCEDURE:  Excision of mass of left forearm.   SURGEON:  Sandria Bales. Ezzard Standing, M.D.   ANESTHESIA:  1.5 mL of Xylocaine.   COMPLICATIONS:  None.   INDICATIONS FOR PROCEDURE:  The patient is a 65 year old black male who  thinks had an insect bite on his left forearm and had a residual nodule on  his left forearm and now comes for excision of this nodule.  The thing is  whether it is subcuticular.  I do not think it is a sebaceous cyst; it could  be a lipoma or some foreign body.   DESCRIPTION OF PROCEDURE:  With the patient in the supine position with his  left arm prepped with Betadine solution and sterilely draped, I infiltrated  the skin with about 1.5 mL of Xylocaine with epinephrine.  I then made a  linear incision directly over the mass and excised it.  It was about a 6 to  7 mm nodule.  The skin was then closed with a 5-0 Vicryl suture and painted  with tincture of Benzoin and Steri-Strips.  The patient tolerated the  procedure well and we will see him back in two to three weeks for follow-up.      Davi   DHN/MEDQ  D:  09/16/2004  T:  09/16/2004  Job:  [id number]

## 2011-03-31 NOTE — Assessment & Plan Note (Signed)
[facility]                             PULMONARY OFFICE NOTE   NAME:Richard Blake, Richard Blake                     MRN:          [id number]  DATE:01/04/2007                            DOB:          1946/04/10    HISTORY:  A 65 year old black male with chronic sarcoid first diagnosed  in 1999 but on and off prednisone since then and off completely  effective September 24, 2006, with no increasing cough or dyspnea over  baseline.  He also denies any ocular or articular complaints, fevers,  chills, sweats or rashes.   His only medicine is Maxzide 25 one-half q.a.m.   PHYSICAL EXAMINATION:  GENERAL:  He is a pleasant ambulatory black male  in no acute distress.  VITAL SIGNS:  He is afebrile with normal vital signs.  Blood pressure  130/80.  HEENT:  Unremarkable, oropharynx clear.  LUNG FIELDS:  Clear bilaterally to auscultation and percussion.  HEART:  There is a regular rate and rhythm without murmur, gallop or  rub.  ABDOMEN:  Soft, benign.  EXTREMITIES:  Warm without calf tenderness, cyanosis, clubbing, or  edema.   Chest x-ray shows no evidence of increased interstitial disease.   IMPRESSION:  1. Sarcoid that is no longer steroid dependent with minimal residual      interstitial scarring and no significant symptoms of consequence.  2. His blood pressure is well controlled.  I believe the reason      Maxzide was added in the first place was because of the effects of      steroids but he is doing so well on the half tablet of Maxzide a      day I am inclined to leave it the same and encouraged him to return      to his primary care physician for regular followup.   Final pulmonary followup will be in 3 months, sooner if needed.     Richard Dalton. [name], MD, [facility]  Electronically Signed    MBW/MedQ  DD: 01/04/2007  DT: 01/04/2007  Job #: [id number]

## 2011-03-31 NOTE — Assessment & Plan Note (Signed)
[facility]                               PULMONARY OFFICE NOTE   Richard Blake, Richard Blake                     MRN:          [id number]  DATE:09/24/2006                            DOB:          [birth year]    HISTORY:  A 65 year old black male on prednisone since June 2006.  Off and  on prednisone for the last 8 years, now most recently started on prednisone  in January 2006 for a cough and dyspnea secondary to sarcoid which have  resolved and have not recurred as prednisone has been tapered down to 5 mg  tablets, 1/2 every third day.  He has minor aches and pains for which he  takes an occasional Tylenol, but no other systemic symptoms and denies any  reoccurring cough, dyspnea, itching, sneezing, wheezing or ocular  complaints.   MEDICATIONS:  Reviewed in detail on the patient comp dated September 24, 2006, corrected as listed.   PHYSICAL EXAMINATION:  He is a pleasant ambulatory black male in no acute  distress.  Stable vital signs.  HEENT:  Unremarkable.  Pharynx clear.  LUNGS:  Clear bilaterally to auscultation and percussion.  HEART:  There is a regular rate and rhythm without murmur, gallop or rub.  ABDOMEN:  Soft, benign.  EXTREMITIES:  Without calf tenderness, cyanosis or clubbing.   IMPRESSION:  1. No evidence of active sarcoid, having tapered prednisone to a new 4      of 5 mg 1/2 every third day.  I recommended at this point that he stop      it.  If his symptoms reoccur he should resume his previous ceiling of      prednisone 5 mg two daily until better and then work back towards his      previous 4.  2. Minor aches and pains are to be expected as prednisone is tapered.  He      has no evidence of an adrenal insufficiency at this point clinically,      but I did review both of these issues with him and recommended as      needed Tylenol and/or Advil for aches and pains and let me know if      nausea or weakness develop.  3. Follow  up will be in 1 month.     Richard Blake. [name], MD, [facility]  Electronically Signed    MBW/MedQ  DD: 09/25/2006  DT: 09/25/2006  Job #: [id number]

## 2011-06-21 ENCOUNTER — Encounter: Payer: Self-pay | Admitting: Internal Medicine

## 2011-06-22 ENCOUNTER — Encounter: Payer: Self-pay | Admitting: Internal Medicine

## 2011-06-22 ENCOUNTER — Ambulatory Visit (INDEPENDENT_AMBULATORY_CARE_PROVIDER_SITE_OTHER): Payer: Self-pay | Admitting: Internal Medicine

## 2011-06-22 VITALS — BP 130/80 | HR 60 | Temp 98.3°F | Resp 16 | Wt 170.0 lb

## 2011-06-22 DIAGNOSIS — E782 Mixed hyperlipidemia: Secondary | ICD-10-CM

## 2011-06-22 DIAGNOSIS — I10 Essential (primary) hypertension: Secondary | ICD-10-CM

## 2011-06-22 MED ORDER — AMLODIPINE-OLMESARTAN 5-20 MG PO TABS: 1.0000 | ORAL_TABLET | Freq: Every day | ORAL | Status: DC

## 2011-06-22 NOTE — Patient Instructions (Signed)

## 2011-06-22 NOTE — Assessment & Plan Note (Signed)
He does not want to proceed with any further testing or treatment at this time

## 2011-06-22 NOTE — Assessment & Plan Note (Signed)
His BP is well controlled 

## 2011-06-22 NOTE — Progress Notes (Signed)
  Subjective:    Patient ID: Richard Blake, male    DOB: [birth year], 65 y.o.   MRN: [id number]  Hypertension This is a chronic problem. The current episode started more than 1 year ago. The problem has been gradually improving since onset. The problem is controlled. Pertinent negatives include no anxiety, blurred vision, chest pain, headaches, malaise/fatigue, neck pain, orthopnea, palpitations, peripheral edema, PND, shortness of breath or sweats. There are no associated agents to hypertension. Past treatments include calcium channel blockers and angiotensin blockers. The current treatment provides significant improvement. There are no compliance problems.       Review of Systems  Constitutional: Negative for fever, chills, malaise/fatigue, diaphoresis, activity change, appetite change, fatigue and unexpected weight change.  HENT: Negative.  Negative for neck pain.   Eyes: Negative.  Negative for blurred vision.  Respiratory: Negative for apnea, cough, choking, chest tightness, shortness of breath, wheezing and stridor.   Cardiovascular: Negative for chest pain, palpitations, orthopnea, leg swelling and PND.  Gastrointestinal: Negative.   Genitourinary: Negative.   Musculoskeletal: Negative.   Skin: Negative.   Neurological: Negative.  Negative for headaches.  Hematological: Negative.   Psychiatric/Behavioral: Negative.        Objective:   Physical Exam  Vitals reviewed. Constitutional: He is oriented to person, place, and time. He appears well-developed and well-nourished. No distress.  HENT:  Head: Normocephalic and atraumatic.  Right Ear: External ear normal.  Left Ear: External ear normal.  Nose: Nose normal.  Mouth/Throat: Oropharynx is clear and moist. No oropharyngeal exudate.  Eyes: Conjunctivae and EOM are normal. Pupils are equal, round, and reactive to light. Right eye exhibits no discharge. Left eye exhibits no discharge. No scleral icterus.  Neck: Normal range of  motion. Neck supple. No JVD present. No tracheal deviation present. No thyromegaly present.  Cardiovascular: Normal rate, regular rhythm, normal heart sounds and intact distal pulses.  Exam reveals no gallop and no friction rub.   No murmur heard. Pulmonary/Chest: Effort normal and breath sounds normal. No stridor. No respiratory distress. He has no wheezes. He has no rales. He exhibits no tenderness.  Abdominal: Soft. Bowel sounds are normal. He exhibits no distension and no mass. There is no tenderness. There is no rebound and no guarding.  Musculoskeletal: Normal range of motion. He exhibits no edema and no tenderness.  Lymphadenopathy:    He has no cervical adenopathy.  Neurological: He is alert and oriented to person, place, and time. He has normal reflexes. He displays normal reflexes. No cranial nerve deficit. He exhibits normal muscle tone. Coordination normal.  Skin: Skin is warm and dry. No rash noted. He is not diaphoretic. No erythema. No pallor.  Psychiatric: He has a normal mood and affect. His behavior is normal. Judgment and thought content normal.     Lab Results  Component Value Date   WBC 4.2* 01/17/2011   HGB 14.2 01/17/2011   HCT 42.4 01/17/2011   PLT 192.0 01/17/2011   CHOL 260* 10/31/2010   TRIG 185.0* 10/31/2010   HDL 55.50 10/31/2010   LDLDIRECT 163.8 10/31/2010   ALT 16 10/31/2010   AST 22 10/31/2010   NA 141 01/17/2011   K 4.0 01/17/2011   CL 104 01/17/2011   CREATININE 1.2 01/17/2011   BUN 15 01/17/2011   CO2 31 01/17/2011   TSH 0.66 10/31/2010   PSA 3.23 01/17/2011       Assessment & Plan:

## 2012-01-24 ENCOUNTER — Other Ambulatory Visit (INDEPENDENT_AMBULATORY_CARE_PROVIDER_SITE_OTHER): Payer: Self-pay

## 2012-01-24 ENCOUNTER — Encounter: Payer: Self-pay | Admitting: Internal Medicine

## 2012-01-24 ENCOUNTER — Ambulatory Visit (INDEPENDENT_AMBULATORY_CARE_PROVIDER_SITE_OTHER): Payer: Self-pay | Admitting: Internal Medicine

## 2012-01-24 VITALS — BP 128/72 | HR 75 | Temp 97.8°F | Resp 16 | Wt 178.0 lb

## 2012-01-24 DIAGNOSIS — D869 Sarcoidosis, unspecified: Secondary | ICD-10-CM

## 2012-01-24 DIAGNOSIS — E782 Mixed hyperlipidemia: Secondary | ICD-10-CM

## 2012-01-24 DIAGNOSIS — I10 Essential (primary) hypertension: Secondary | ICD-10-CM

## 2012-01-24 DIAGNOSIS — N401 Enlarged prostate with lower urinary tract symptoms: Secondary | ICD-10-CM

## 2012-01-24 DIAGNOSIS — M1990 Unspecified osteoarthritis, unspecified site: Secondary | ICD-10-CM

## 2012-01-24 DIAGNOSIS — Z0000 Encounter for general adult medical examination without abnormal findings: Secondary | ICD-10-CM

## 2012-01-24 LAB — COMPREHENSIVE METABOLIC PANEL
ALT: 15 U/L (ref 0–53)
AST: 19 U/L (ref 0–37)
Albumin: 4.1 g/dL (ref 3.5–5.2)
Alkaline Phosphatase: 71 U/L (ref 39–117)
Potassium: 4 mEq/L (ref 3.5–5.1)
Sodium: 140 mEq/L (ref 135–145)
Total Protein: 7.2 g/dL (ref 6.0–8.3)

## 2012-01-24 LAB — URINALYSIS, ROUTINE W REFLEX MICROSCOPIC
Hgb urine dipstick: NEGATIVE
Ketones, ur: NEGATIVE
Urine Glucose: NEGATIVE
Urobilinogen, UA: 0.2 (ref 0.0–1.0)

## 2012-01-24 LAB — TSH: TSH: 0.67 u[IU]/mL (ref 0.35–5.50)

## 2012-01-24 LAB — FECAL OCCULT BLOOD, GUAIAC: Fecal Occult Blood: NEGATIVE

## 2012-01-24 LAB — CBC WITH DIFFERENTIAL/PLATELET
Basophils Relative: 0.5 % (ref 0.0–3.0)
Eosinophils Absolute: 0.1 10*3/uL (ref 0.0–0.7)
MCHC: 32.7 g/dL (ref 30.0–36.0)
MCV: 85.1 fl (ref 78.0–100.0)
Monocytes Absolute: 0.3 10*3/uL (ref 0.1–1.0)
Neutrophils Relative %: 67.2 % (ref 43.0–77.0)
RBC: 4.9 Mil/uL (ref 4.22–5.81)
RDW: 15.2 % — ABNORMAL HIGH (ref 11.5–14.6)

## 2012-01-24 LAB — LIPID PANEL
HDL: 57.1 mg/dL (ref >39.00)
Total CHOL/HDL Ratio: 5
Triglycerides: 155 mg/dL — ABNORMAL HIGH (ref 0.0–149.0)

## 2012-01-24 NOTE — Patient Instructions (Signed)
Hypertriglyceridemia  Diet for High blood levels of Triglycerides Most fats in food are triglycerides. Triglycerides in your blood are stored as fat in your body. High levels of triglycerides in your blood may put you at a greater risk for heart disease and stroke.  Normal triglyceride levels are less than 150 mg/dL. Borderline high levels are 150-199 mg/dl. High levels are 200 - 499 mg/dL, and very high triglyceride levels are greater than 500 mg/dL. The decision to treat high triglycerides is generally based on the level. For people with borderline or high triglyceride levels, treatment includes weight loss and exercise. Drugs are recommended for people with very high triglyceride levels. Many people who need treatment for high triglyceride levels have metabolic syndrome. This syndrome is a collection of disorders that often include: insulin resistance, high blood pressure, blood clotting problems, high cholesterol and triglycerides. TESTING PROCEDURE FOR TRIGLYCERIDES  You should not eat 4 hours before getting your triglycerides measured. The normal range of triglycerides is between 10 and 250 milligrams per deciliter (mg/dl). Some people may have extreme levels (1000 or above), but your triglyceride level may be too high if it is above 150 mg/dl, depending on what other risk factors you have for heart disease.   People with high blood triglycerides may also have high blood cholesterol levels. If you have high blood cholesterol as well as high blood triglycerides, your risk for heart disease is probably greater than if you only had high triglycerides. High blood cholesterol is one of the main risk factors for heart disease.  CHANGING YOUR DIET  Your weight can affect your blood triglyceride level. If you are more than 20% above your ideal body weight, you may be able to lower your blood triglycerides by losing weight. Eating less and exercising regularly is the best way to combat this. Fat provides  more calories than any other food. The best way to lose weight is to eat less fat. Only 30% of your total calories should come from fat. Less than 7% of your diet should come from saturated fat. A diet low in fat and saturated fat is the same as a diet to decrease blood cholesterol. By eating a diet lower in fat, you may lose weight, lower your blood cholesterol, and lower your blood triglyceride level.  Eating a diet low in fat, especially saturated fat, may also help you lower your blood triglyceride level. Ask your dietitian to help you figure how much fat you can eat based on the number of calories your caregiver has prescribed for you.  Exercise, in addition to helping with weight loss may also help lower triglyceride levels.   Alcohol can increase blood triglycerides. You may need to stop drinking alcoholic beverages.   Too much carbohydrate in your diet may also increase your blood triglycerides. Some complex carbohydrates are necessary in your diet. These may include bread, rice, potatoes, other starchy vegetables and cereals.   Reduce "simple" carbohydrates. These may include pure sugars, candy, honey, and jelly without losing other nutrients. If you have the kind of high blood triglycerides that is affected by the amount of carbohydrates in your diet, you will need to eat less sugar and less high-sugar foods. Your caregiver can help you with this.   Adding 2-4 grams of fish oil (EPA+ DHA) may also help lower triglycerides. Speak with your caregiver before adding any supplements to your regimen.  Following the Diet  Maintain your ideal weight. Your caregivers can help you with a diet. Generally,   eating less food and getting more exercise will help you lose weight. Joining a weight control group may also help. Ask your caregivers for a good weight control group in your area.  Eat low-fat foods instead of high-fat foods. This can help you lose weight too.  These foods are lower in fat. Eat MORE  of these:   Dried beans, peas, and lentils.   Egg whites.   Low-fat cottage cheese.   Fish.   Lean cuts of meat, such as round, sirloin, rump, and flank (cut extra fat off meat you fix).   Whole grain breads, cereals and pasta.   Skim and nonfat dry milk.   Low-fat yogurt.   Poultry without the skin.   Cheese made with skim or part-skim milk, such as mozzarella, parmesan, farmers', ricotta, or pot cheese.  These are higher fat foods. Eat LESS of these:   Whole milk and foods made from whole milk, such as American, blue, cheddar, monterey jack, and swiss cheese   High-fat meats, such as luncheon meats, sausages, knockwurst, bratwurst, hot dogs, ribs, corned beef, ground pork, and regular ground beef.   Fried foods.  Limit saturated fats in your diet. Substituting unsaturated fat for saturated fat may decrease your blood triglyceride level. You will need to read package labels to know which products contain saturated fats.  These foods are high in saturated fat. Eat LESS of these:   Fried pork skins.   Whole milk.   Skin and fat from poultry.   Palm oil.   Butter.   Shortening.   Cream cheese.   Bacon.   Margarines and baked goods made from listed oils.   Vegetable shortenings.   Chitterlings.   Fat from meats.   Coconut oil.   Palm kernel oil.   Lard.   Cream.   Sour cream.   Fatback.   Coffee whiteners and non-dairy creamers made with these oils.   Cheese made from whole milk.  Use unsaturated fats (both polyunsaturated and monounsaturated) moderately. Remember, even though unsaturated fats are better than saturated fats; you still want a diet low in total fat.  These foods are high in unsaturated fat:   Canola oil.   Sunflower oil.   Mayonnaise.   Almonds.   Peanuts.   Pine nuts.   Margarines made with these oils.   Safflower oil.   Olive oil.   Avocados.   Cashews.   Peanut butter.   Sunflower seeds.   Soybean oil.     Peanut oil.   Olives.   Pecans.   Walnuts.   Pumpkin seeds.  Avoid sugar and other high-sugar foods. This will decrease carbohydrates without decreasing other nutrients. Sugar in your food goes rapidly to your blood. When there is excess sugar in your blood, your liver may use it to make more triglycerides. Sugar also contains calories without other important nutrients.  Eat LESS of these:   Sugar, brown sugar, powdered sugar, jam, jelly, preserves, honey, syrup, molasses, pies, candy, cakes, cookies, frosting, pastries, colas, soft drinks, punches, fruit drinks, and regular gelatin.   Avoid alcohol. Alcohol, even more than sugar, may increase blood triglycerides. In addition, alcohol is high in calories and low in nutrients. Ask for sparkling water, or a diet soft drink instead of an alcoholic beverage.  Suggestions for planning and preparing meals   Bake, broil, grill or roast meats instead of frying.   Remove fat from meats and skin from poultry before cooking.   Add spices,   herbs, lemon juice or vinegar to vegetables instead of salt, rich sauces or gravies.   Use a non-stick skillet without fat or use no-stick sprays.   Cool and refrigerate stews and broth. Then remove the hardened fat floating on the surface before serving.   Refrigerate meat drippings and skim off fat to make low-fat gravies.   Serve more fish.   Use less butter, margarine and other high-fat spreads on bread or vegetables.   Use skim or reconstituted non-fat dry milk for cooking.   Cook with low-fat cheeses.   Substitute low-fat yogurt or cottage cheese for all or part of the sour cream in recipes for sauces, dips or congealed salads.   Use half yogurt/half mayonnaise in salad recipes.   Substitute evaporated skim milk for cream. Evaporated skim milk or reconstituted non-fat dry milk can be whipped and substituted for whipped cream in certain recipes.   Choose fresh fruits for dessert instead of  high-fat foods such as pies or cakes. Fruits are naturally low in fat.  When Dining Out   Order low-fat appetizers such as fruit or vegetable juice, pasta with vegetables or tomato sauce.   Select clear, rather than cream soups.   Ask that dressings and gravies be served on the side. Then use less of them.   Order foods that are baked, broiled, poached, steamed, stir-fried, or roasted.   Ask for margarine instead of butter, and use only a small amount.   Drink sparkling water, unsweetened tea or coffee, or diet soft drinks instead of alcohol or other sweet beverages.  QUESTIONS AND ANSWERS ABOUT OTHER FATS IN THE BLOOD: SATURATED FAT, TRANS FAT, AND CHOLESTEROL What is trans fat? Trans fat is a type of fat that is formed when vegetable oil is hardened through a process called hydrogenation. This process helps makes foods more solid, gives them shape, and prolongs their shelf life. Trans fats are also called hydrogenated or partially hydrogenated oils.  What do saturated fat, trans fat, and cholesterol in foods have to do with heart disease? Saturated fat, trans fat, and cholesterol in the diet all raise the level of LDL "bad" cholesterol in the blood. The higher the LDL cholesterol, the greater the risk for coronary heart disease (CHD). Saturated fat and trans fat raise LDL similarly.  What foods contain saturated fat, trans fat, and cholesterol? High amounts of saturated fat are found in animal products, such as fatty cuts of meat, chicken skin, and full-fat dairy products like butter, whole milk, cream, and cheese, and in tropical vegetable oils such as palm, palm kernel, and coconut oil. Trans fat is found in some of the same foods as saturated fat, such as vegetable shortening, some margarines (especially hard or stick margarine), crackers, cookies, baked goods, fried foods, salad dressings, and other processed foods made with partially hydrogenated vegetable oils. Small amounts of trans fat  also occur naturally in some animal products, such as milk products, beef, and lamb. Foods high in cholesterol include liver, other organ meats, egg yolks, shrimp, and full-fat dairy products. How can I use the new food label to make heart-healthy food choices? Check the Nutrition Facts panel of the food label. Choose foods lower in saturated fat, trans fat, and cholesterol. For saturated fat and cholesterol, you can also use the Percent Daily Value (%DV): 5% DV or less is low, and 20% DV or more is high. (There is no %DV for trans fat.) Use the Nutrition Facts panel to choose foods low in   saturated fat and cholesterol, and if the trans fat is not listed, read the ingredients and limit products that list shortening or hydrogenated or partially hydrogenated vegetable oil, which tend to be high in trans fat. POINTS TO REMEMBER: YOU NEED A LITTLE TLC (THERAPEUTIC LIFESTYLE CHANGES)  Discuss your risk for heart disease with your caregivers, and take steps to reduce risk factors.   Change your diet. Choose foods that are low in saturated fat, trans fat, and cholesterol.   Add exercise to your daily routine if it is not already being done. Participate in physical activity of moderate intensity, like brisk walking, for at least 30 minutes on most, and preferably all days of the week. No time? Break the 30 minutes into three, 10-minute segments during the day.   Stop smoking. If you do smoke, contact your caregiver to discuss ways in which they can help you quit.   Do not use street drugs.   Maintain a normal weight.   Maintain a healthy blood pressure.   Keep up with your blood work for checking the fats in your blood as directed by your caregiver.  Document Released: 08/17/2004 Document Revised: 10/19/2011 Document Reviewed: 03/15/2009 [employer] Patient Information 2012 [employer].Hypertension As your heart beats, it forces blood through your arteries. This force is your blood pressure. If the  pressure is too high, it is called hypertension (HTN) or high blood pressure. HTN is dangerous because you may have it and not know it. High blood pressure may mean that your heart has to work harder to pump blood. Your arteries may be narrow or stiff. The extra work puts you at risk for heart disease, stroke, and other problems.  Blood pressure consists of two numbers, a higher number over a lower, 110/72, for example. It is stated as "110 over 72." The ideal is below 120 for the top number (systolic) and under 80 for the bottom (diastolic). Write down your blood pressure today. You should pay close attention to your blood pressure if you have certain conditions such as:  Heart failure.   Prior heart attack.   Diabetes   Chronic kidney disease.   Prior stroke.   Multiple risk factors for heart disease.  To see if you have HTN, your blood pressure should be measured while you are seated with your arm held at the level of the heart. It should be measured at least twice. A one-time elevated blood pressure reading (especially in the Emergency Department) does not mean that you need treatment. There may be conditions in which the blood pressure is different between your right and left arms. It is important to see your caregiver soon for a recheck. Most people have essential hypertension which means that there is not a specific cause. This type of high blood pressure may be lowered by changing lifestyle factors such as:  Stress.   Smoking.   Lack of exercise.   Excessive weight.   Drug/tobacco/alcohol use.   Eating less salt.  Most people do not have symptoms from high blood pressure until it has caused damage to the body. Effective treatment can often prevent, delay or reduce that damage. TREATMENT  When a cause has been identified, treatment for high blood pressure is directed at the cause. There are a large number of medications to treat HTN. These fall into several categories, and your  caregiver will help you select the medicines that are best for you. Medications may have side effects. You should review side effects with   your caregiver. If your blood pressure stays high after you have made lifestyle changes or started on medicines,   Your medication(s) may need to be changed.   Other problems may need to be addressed.   Be certain you understand your prescriptions, and know how and when to take your medicine.   Be sure to follow up with your caregiver within the time frame advised (usually within two weeks) to have your blood pressure rechecked and to review your medications.   If you are taking more than one medicine to lower your blood pressure, make sure you know how and at what times they should be taken. Taking two medicines at the same time can result in blood pressure that is too low.  SEEK IMMEDIATE MEDICAL CARE IF:  You develop a severe headache, blurred or changing vision, or confusion.   You have unusual weakness or numbness, or a faint feeling.   You have severe chest or abdominal pain, vomiting, or breathing problems.  MAKE SURE YOU:   Understand these instructions.   Will watch your condition.   Will get help right away if you are not doing well or get worse.  Document Released: 10/30/2005 Document Revised: 10/19/2011 Document Reviewed: 06/19/2008 [employer] Patient Information 2012 [employer]. 

## 2012-01-24 NOTE — Progress Notes (Signed)
Subjective:    Patient ID: Richard Blake, male    DOB: May 18, 1946, 66 y.o.   MRN: [id number]  Hypertension This is a chronic problem. The current episode started more than 1 year ago. The problem has been gradually improving since onset. The problem is controlled. Pertinent negatives include no anxiety, blurred vision, chest pain, headaches, malaise/fatigue, neck pain, orthopnea, palpitations, peripheral edema, PND, shortness of breath or sweats. There are no associated agents to hypertension. Past treatments include angiotensin blockers and calcium channel blockers. The current treatment provides moderate improvement. Compliance problems include exercise and diet.       Review of Systems  Constitutional: Negative for fever, chills, malaise/fatigue, diaphoresis, activity change, appetite change, fatigue and unexpected weight change.  HENT: Negative.  Negative for neck pain.   Eyes: Negative.  Negative for blurred vision.  Respiratory: Negative for cough, choking, chest tightness, shortness of breath, wheezing and stridor.   Cardiovascular: Negative for chest pain, palpitations, orthopnea, leg swelling and PND.  Gastrointestinal: Negative for nausea, vomiting, abdominal pain, diarrhea, constipation, blood in stool, abdominal distention and anal bleeding.  Genitourinary: Negative for dysuria, urgency, frequency, hematuria, flank pain, decreased urine volume, discharge, penile swelling, scrotal swelling, enuresis, difficulty urinating, genital sores, penile pain and testicular pain.  Musculoskeletal: Negative for myalgias, back pain, joint swelling, arthralgias and gait problem.  Skin: Negative for color change, pallor, rash and wound.  Neurological: Negative.  Negative for headaches.  Hematological: Negative for adenopathy. Does not bruise/bleed easily.  Psychiatric/Behavioral: Negative.        Objective:   Physical Exam  Vitals reviewed. Constitutional: He is oriented to person,  place, and time. He appears well-developed and well-nourished. No distress.  HENT:  Head: Normocephalic and atraumatic.  Mouth/Throat: Oropharynx is clear and moist. No oropharyngeal exudate.  Eyes: Conjunctivae are normal. Right eye exhibits no discharge. Left eye exhibits no discharge. No scleral icterus.  Neck: Normal range of motion. Neck supple. No JVD present. No tracheal deviation present. No thyromegaly present.  Cardiovascular: Normal rate, regular rhythm, normal heart sounds and intact distal pulses.  Exam reveals no gallop and no friction rub.   No murmur heard. Pulmonary/Chest: Effort normal and breath sounds normal. No stridor. No respiratory distress. He has no wheezes. He has no rales. He exhibits no tenderness.  Abdominal: Soft. Bowel sounds are normal. He exhibits no distension and no mass. There is no tenderness. There is no rebound and no guarding. Hernia confirmed negative in the right inguinal area and confirmed negative in the left inguinal area.  Genitourinary: Rectum normal and penis normal. Rectal exam shows no external hemorrhoid, no internal hemorrhoid, no fissure, no mass, no tenderness and anal tone normal. Guaiac negative stool. 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 Prostate is not tender. Right testis shows no mass, no swelling and no tenderness. Right testis is descended. 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 Circumcised. No penile tenderness. No discharge found.  Musculoskeletal: Normal range of motion. He exhibits no edema and no tenderness.  Lymphadenopathy:    He has no cervical adenopathy.       Right: No inguinal adenopathy present.       Left: No inguinal adenopathy present.  Neurological: He is oriented to person, place, and time.  Skin: Skin is warm and dry. No rash noted. He is not diaphoretic. No erythema. No pallor.  Psychiatric: He has a normal mood and affect. His behavior is normal. Judgment and thought content normal.      Lab Results  Component Value Date   WBC 4.2* 01/17/2011   HGB 14.2 01/17/2011   HCT 42.4 01/17/2011   PLT 192.0 01/17/2011   GLUCOSE 84 01/17/2011   CHOL 260* 10/31/2010   TRIG 185.0* 10/31/2010   HDL 55.50 10/31/2010   LDLDIRECT 163.8 10/31/2010   ALT 16 10/31/2010   AST 22 10/31/2010   NA 141 01/17/2011   K 4.0 01/17/2011   CL 104 01/17/2011   CREATININE 1.2 01/17/2011   BUN 15 01/17/2011   CO2 31 01/17/2011   TSH 0.66 10/31/2010   PSA 3.23 01/17/2011      Assessment & Plan:

## 2012-01-28 DIAGNOSIS — Z0000 Encounter for general adult medical examination without abnormal findings: Secondary | ICD-10-CM | POA: Insufficient documentation

## 2012-01-28 NOTE — Assessment & Plan Note (Signed)
Exam done, labs ordered, pt ed material was given 

## 2012-01-28 NOTE — Assessment & Plan Note (Signed)
I will check his FLP today 

## 2012-01-28 NOTE — Assessment & Plan Note (Signed)
His BP is well controlled, I will check his lytes and renal function today 

## 2012-01-28 NOTE — Assessment & Plan Note (Signed)
He has no s/s, I will check his PSA today 

## 2012-10-16 DIAGNOSIS — Z23 Encounter for immunization: Secondary | ICD-10-CM | POA: Diagnosis not present

## 2012-10-18 ENCOUNTER — Other Ambulatory Visit: Payer: Self-pay | Admitting: Neurology

## 2012-10-18 DIAGNOSIS — D869 Sarcoidosis, unspecified: Secondary | ICD-10-CM

## 2012-10-18 DIAGNOSIS — G959 Disease of spinal cord, unspecified: Secondary | ICD-10-CM

## 2012-10-23 ENCOUNTER — Other Ambulatory Visit (INDEPENDENT_AMBULATORY_CARE_PROVIDER_SITE_OTHER): Payer: Medicare Other

## 2012-10-23 ENCOUNTER — Ambulatory Visit (INDEPENDENT_AMBULATORY_CARE_PROVIDER_SITE_OTHER): Payer: Medicare Other | Admitting: Internal Medicine

## 2012-10-23 ENCOUNTER — Encounter: Payer: Self-pay | Admitting: Internal Medicine

## 2012-10-23 VITALS — BP 144/86 | HR 75 | Temp 97.7°F | Resp 16 | Wt 172.0 lb

## 2012-10-23 DIAGNOSIS — I10 Essential (primary) hypertension: Secondary | ICD-10-CM | POA: Diagnosis not present

## 2012-10-23 DIAGNOSIS — E782 Mixed hyperlipidemia: Secondary | ICD-10-CM

## 2012-10-23 LAB — COMPREHENSIVE METABOLIC PANEL
ALT: 18 U/L (ref 0–53)
AST: 21 U/L (ref 0–37)
Albumin: 4.3 g/dL (ref 3.5–5.2)
Alkaline Phosphatase: 76 U/L (ref 39–117)
BUN: 13 mg/dL (ref 6–23)
Potassium: 4.4 mEq/L (ref 3.5–5.1)
Sodium: 141 mEq/L (ref 135–145)

## 2012-10-23 LAB — LDL CHOLESTEROL, DIRECT: Direct LDL: 186.5 mg/dL

## 2012-10-23 LAB — LIPID PANEL
Total CHOL/HDL Ratio: 5
Triglycerides: 194 mg/dL — ABNORMAL HIGH (ref 0.0–149.0)

## 2012-10-23 MED ORDER — AMLODIPINE BESYLATE 10 MG PO TABS: 10.0000 mg | ORAL_TABLET | Freq: Every day | ORAL | Status: DC

## 2012-10-23 MED ORDER — ROSUVASTATIN CALCIUM 10 MG PO TABS: 10.0000 mg | ORAL_TABLET | Freq: Every day | ORAL | Status: DC

## 2012-10-23 MED ORDER — LOSARTAN POTASSIUM 100 MG PO TABS: 100.0000 mg | ORAL_TABLET | Freq: Every day | ORAL | Status: DC

## 2012-10-23 NOTE — Patient Instructions (Signed)

## 2012-10-23 NOTE — Progress Notes (Signed)
  Subjective:    Patient ID: Richard Blake, male    DOB: 1946/05/26, 66 y.o.   MRN: [id number]  Hypertension This is a chronic problem. The current episode started more than 1 year ago. The problem is unchanged. The problem is controlled. Associated symptoms include neck pain. Pertinent negatives include no anxiety, blurred vision, chest pain, headaches, malaise/fatigue, orthopnea, palpitations, peripheral edema, PND, shortness of breath or sweats. Past treatments include angiotensin blockers and calcium channel blockers. The current treatment provides moderate improvement. Compliance problems include exercise, diet and medication cost.       Review of Systems  Constitutional: Negative for fever, chills, malaise/fatigue, diaphoresis, activity change, appetite change, fatigue and unexpected weight change.  HENT: Positive for neck pain. Negative for facial swelling.   Eyes: Negative.  Negative for blurred vision.  Respiratory: Negative for cough, chest tightness, shortness of breath, wheezing and stridor.   Cardiovascular: Negative for chest pain, palpitations, orthopnea and PND.  Gastrointestinal: Negative for nausea, vomiting, abdominal pain, diarrhea, constipation and anal bleeding.  Genitourinary: Negative.   Musculoskeletal: Positive for arthralgias (knees). Negative for myalgias, back pain, joint swelling and gait problem.  Skin: Negative for color change, pallor, rash and wound.  Neurological: Negative for dizziness, tremors, seizures, syncope, facial asymmetry, speech difficulty, weakness, light-headedness, numbness and headaches.  Hematological: Negative for adenopathy. Does not bruise/bleed easily.  Psychiatric/Behavioral: Negative.        Objective:   Physical Exam  Vitals reviewed. Constitutional: He is oriented to person, place, and time. He appears well-developed and well-nourished. No distress.  HENT:  Head: Normocephalic and atraumatic.  Mouth/Throat: Oropharynx is  clear and moist. No oropharyngeal exudate.  Eyes: Conjunctivae normal are normal. Right eye exhibits no discharge. Left eye exhibits no discharge. No scleral icterus.  Neck: Normal range of motion. Neck supple. No JVD present. No tracheal deviation present. No thyromegaly present.  Cardiovascular: Normal rate, regular rhythm, normal heart sounds and intact distal pulses.  Exam reveals no gallop and no friction rub.   No murmur heard. Pulmonary/Chest: Effort normal and breath sounds normal. No stridor. No respiratory distress. He has no wheezes. He has no rales. He exhibits no tenderness.  Abdominal: Soft. Bowel sounds are normal. He exhibits no distension and no mass. There is no tenderness. There is no rebound and no guarding.  Musculoskeletal: Normal range of motion. He exhibits no edema and no tenderness.  Lymphadenopathy:    He has no cervical adenopathy.  Neurological: He is oriented to person, place, and time.  Skin: Skin is warm and dry. No rash noted. He is not diaphoretic. No erythema. No pallor.  Psychiatric: He has a normal mood and affect. His behavior is normal. Judgment and thought content normal.     Lab Results  Component Value Date   WBC 4.1* 01/24/2012   HGB 13.6 01/24/2012   HCT 41.7 01/24/2012   PLT 171.0 01/24/2012   GLUCOSE 82 01/24/2012   CHOL 261* 01/24/2012   TRIG 155.0* 01/24/2012   HDL 57.10 01/24/2012   LDLDIRECT 163.2 01/24/2012   ALT 15 01/24/2012   AST 19 01/24/2012   NA 140 01/24/2012   K 4.0 01/24/2012   CL 102 01/24/2012   CREATININE 1.0 01/24/2012   BUN 12 01/24/2012   CO2 29 01/24/2012   TSH 0.67 01/24/2012   PSA 3.19 01/24/2012       Assessment & Plan:

## 2012-10-24 ENCOUNTER — Ambulatory Visit (INDEPENDENT_AMBULATORY_CARE_PROVIDER_SITE_OTHER): Payer: Medicare Other | Admitting: Internal Medicine

## 2012-10-24 ENCOUNTER — Encounter: Payer: Self-pay | Admitting: Internal Medicine

## 2012-10-24 ENCOUNTER — Ambulatory Visit (INDEPENDENT_AMBULATORY_CARE_PROVIDER_SITE_OTHER)
Admission: RE | Admit: 2012-10-24 | Discharge: 2012-10-24 | Disposition: A | Discharge status: Home / Self Care | Payer: Medicare Other | Source: Ambulatory Visit | Attending: Internal Medicine | Admitting: Internal Medicine

## 2012-10-24 VITALS — BP 140/100 | HR 74 | Temp 98.0°F | Ht 68.0 in | Wt 172.6 lb

## 2012-10-24 DIAGNOSIS — D869 Sarcoidosis, unspecified: Secondary | ICD-10-CM

## 2012-10-24 DIAGNOSIS — R05 Cough: Secondary | ICD-10-CM

## 2012-10-24 NOTE — Assessment & Plan Note (Signed)
His BP is not well controlled I will increase the dose of the CCB and the ARB Will change to generics at his request I will check his lytes and renal function today

## 2012-10-24 NOTE — Progress Notes (Signed)
Subjective:     Patient ID: Richard Blake, male   DOB: Apr 08, 1946  MRN: [id number]  HPI  49 yobm never smoker diagnosed with sarcoidosis in 1999 associated with chronic interstitial lung disease and a cough that only responded to prednisone failed to respond to treatment directed at rhinitis and reflux suggesting sarcoid as a mechanism. On his own found by trial and error that every time he tapered his prednisone under 5 mg per day the cough recurred, f/u by Richard Blake who had been following his bone density because the patient has a history of lymphocytic infiltration the spinal cord in 1996 suggesting possible sarcoid involvement. Stopped prednisone completely in 2009 > cough did not recur.   10/24/2012 f/u ov/Richard Blake cc intermittent cough/ throat clearing. No sob, rash, ocular or articular complaints or need for prednisone since last eval  Sleeping ok without nocturnal  or early am exacerbation  of respiratory  c/o's or need for noct saba. Also denies any obvious fluctuation of symptoms with weather or environmental changes or other aggravating or alleviating factors except as outlined above   ROS  The following are not active complaints unless bolded sore throat, dysphagia, dental problems, itching, sneezing,  nasal congestion or excess/ purulent secretions, ear ache,   fever, chills, sweats, unintended wt loss, pleuritic or exertional cp, hemoptysis,  orthopnea pnd or leg swelling, presyncope, palpitations, heartburn, abdominal pain, anorexia, nausea, vomiting, diarrhea  or change in bowel or urinary habits, change in stools or urine, dysuria,hematuria,  rash, arthralgias, visual complaints, headache, numbness weakness or ataxia or problems with walking or coordination,  change in mood/affect or memory.         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  wt 175> 179 December 21, 2010 > 10/24/2012  172  HEENT: nl dentition, turbinates, and orophanx. Nl external ear canals without cough reflex  Neck without JVD/Nodes/TM  Lungs clear to A and P bilaterally without cough on insp or exp maneuvers  RRR no s3 or murmur or increase in P2  Abd soft and benign with nl excursion in the supine position. No bruits or organomegaly  Ext warm without calf tenderness, cyanosis clubbing or edema  Skin warm and dry without lesions  CXR  10/24/2012 :  No active disease. Again noted hiatal hernia.   Assessment:          Plan:

## 2012-10-24 NOTE — Assessment & Plan Note (Addendum)
Most likely this is  Classic Upper airway cough syndrome, so named because it's frequently impossible to sort out how much is  CR/sinusitis with freq throat clearing (which can be related to primary GERD)   vs  causing  secondary (" extra esophageal")  GERD from wide swings in gastric pressure that occur with throat clearing, often  promoting self use of mint and menthol lozenges that reduce the lower esophageal sphincter tone and exacerbate the problem further in a cyclical fashion.   These are the same pts (now being labeled as having "irritable larynx syndrome" by some cough centers) who not infrequently have a history of having failed to tolerate ace inhibitors,  dry powder inhalers or biphosphonates or report having atypical reflux symptoms that don't respond to standard doses of PPI , and are easily confused as having aecopd or asthma flares by even experienced allergists/ pulmonologists.   For now rec treat with pepcid with low threshold to change to ppi bid ac if symptoms of cough worsen   See instructions for specific recommendations which were reviewed directly with the patient who was given a copy with highlighter outlining the key components.

## 2012-10-24 NOTE — Progress Notes (Deleted)
sd

## 2012-10-24 NOTE — Assessment & Plan Note (Signed)
His LDL is high so I have asked him to start crestor I will recheck his FLP today

## 2012-10-24 NOTE — Progress Notes (Signed)
Quick Note:  Spoke with pt and notified of results per Dr. Wert. Pt verbalized understanding and denied any questions.  ______ 

## 2012-10-24 NOTE — Patient Instructions (Addendum)
Please remember to go to the  x-ray department downstairs for your tests - we will call you with the results when they are available.  Return to pulmonary clinic for persistent cough or shortness of breath but try pepcid twice daily (once after breakfast and once at bedtime)

## 2012-10-24 NOTE — Assessment & Plan Note (Signed)
No convincing evidence of active dz - cough is now intermittent, upper airway, not requiring prednisone with cxr not supporting any active dz x for [facility] which may be related to the cough (discussed separately.l

## 2012-10-30 DIAGNOSIS — M069 Rheumatoid arthritis, unspecified: Secondary | ICD-10-CM | POA: Diagnosis not present

## 2012-10-30 DIAGNOSIS — M810 Age-related osteoporosis without current pathological fracture: Secondary | ICD-10-CM | POA: Diagnosis not present

## 2012-11-12 ENCOUNTER — Telehealth: Payer: Self-pay | Admitting: *Deleted

## 2012-11-12 NOTE — Telephone Encounter (Signed)
Patient called and request a call back concerning the medication Prolia. He is concerned of the side effects to the medication and wanted to ask about this before making an appt. To get injection. CB# [phone]

## 2012-11-13 DIAGNOSIS — M810 Age-related osteoporosis without current pathological fracture: Secondary | ICD-10-CM

## 2012-11-13 HISTORY — DX: Age-related osteoporosis without current pathological fracture: M81.0

## 2012-11-14 NOTE — Telephone Encounter (Signed)
He wants to get this injection

## 2012-11-14 NOTE — Telephone Encounter (Signed)
Patient called back to schedule his Prolia, he is scheduled for 12/03/2012 @ 2:00

## 2012-11-26 ENCOUNTER — Ambulatory Visit
Admission: RE | Admit: 2012-11-26 | Discharge: 2012-11-26 | Disposition: A | Discharge status: Home / Self Care | Payer: Medicare Other | Source: Ambulatory Visit | Attending: Neurology | Admitting: Neurology

## 2012-11-26 DIAGNOSIS — D869 Sarcoidosis, unspecified: Secondary | ICD-10-CM

## 2012-11-26 DIAGNOSIS — G959 Disease of spinal cord, unspecified: Secondary | ICD-10-CM

## 2012-11-26 MED ORDER — GADOBENATE DIMEGLUMINE 529 MG/ML IV SOLN
16.0000 mL | Freq: Once | INTRAVENOUS | Status: AC | PRN
  Administered 2012-11-26: 16 mL via INTRAVENOUS

## 2012-12-03 ENCOUNTER — Encounter: Payer: Self-pay | Admitting: Internal Medicine

## 2012-12-03 ENCOUNTER — Ambulatory Visit (INDEPENDENT_AMBULATORY_CARE_PROVIDER_SITE_OTHER): Payer: Medicare Other | Admitting: *Deleted

## 2012-12-03 DIAGNOSIS — M810 Age-related osteoporosis without current pathological fracture: Secondary | ICD-10-CM | POA: Diagnosis not present

## 2012-12-03 MED ORDER — DENOSUMAB 60 MG/ML ~~LOC~~ SOLN
60.0000 mg | Freq: Once | SUBCUTANEOUS | Status: AC
  Administered 2012-12-03: 60 mg via SUBCUTANEOUS

## 2013-01-22 ENCOUNTER — Ambulatory Visit (INDEPENDENT_AMBULATORY_CARE_PROVIDER_SITE_OTHER): Payer: Medicare Other | Admitting: Internal Medicine

## 2013-01-22 ENCOUNTER — Encounter: Payer: Self-pay | Admitting: Internal Medicine

## 2013-01-22 ENCOUNTER — Ambulatory Visit (INDEPENDENT_AMBULATORY_CARE_PROVIDER_SITE_OTHER)
Admission: RE | Admit: 2013-01-22 | Discharge: 2013-01-22 | Disposition: A | Discharge status: Home / Self Care | Payer: Medicare Other | Source: Ambulatory Visit | Attending: Internal Medicine | Admitting: Internal Medicine

## 2013-01-22 VITALS — BP 128/84 | HR 77 | Temp 97.7°F | Resp 16 | Wt 179.8 lb

## 2013-01-22 DIAGNOSIS — M25519 Pain in unspecified shoulder: Secondary | ICD-10-CM | POA: Diagnosis not present

## 2013-01-22 DIAGNOSIS — M810 Age-related osteoporosis without current pathological fracture: Secondary | ICD-10-CM | POA: Diagnosis not present

## 2013-01-22 DIAGNOSIS — E785 Hyperlipidemia, unspecified: Secondary | ICD-10-CM

## 2013-01-22 DIAGNOSIS — M25511 Pain in right shoulder: Secondary | ICD-10-CM | POA: Insufficient documentation

## 2013-01-22 DIAGNOSIS — I10 Essential (primary) hypertension: Secondary | ICD-10-CM

## 2013-01-22 MED ORDER — COLESEVELAM HCL 3.75 G PO PACK: 1.0000 | PACK | Freq: Every day | ORAL | Status: DC

## 2013-01-22 NOTE — Assessment & Plan Note (Signed)
I don't think his pain is caused by the statin b/c it is one joint and his muscles do not ache He needs to lower his LDL more so I have asked him to add welchol to the crestor

## 2013-01-22 NOTE — Assessment & Plan Note (Signed)
I will check a plain film to see if he has DJD, bone spur, etc. He will continue motrin as needed

## 2013-01-22 NOTE — Progress Notes (Signed)
Subjective:    Patient ID: Richard Blake, male    DOB: 11/29/1945, 67 y.o.   MRN: [id number]  Shoulder Pain  The pain is present in the right shoulder. This is a chronic problem. The current episode started more than 1 year ago. The problem occurs intermittently. The problem has been gradually worsening. The quality of the pain is described as aching. The pain is at a severity of 1/10. The pain is mild. Associated symptoms include a limited range of motion, numbness (chronically in UE and LE) and tingling (chronically in UE and LE). Pertinent negatives include no fever, inability to bear weight, itching, joint locking, joint swelling or stiffness. He has tried NSAIDS for the symptoms. The treatment provided moderate relief. His past medical history is significant for osteoarthritis.      Review of Systems  Constitutional: Negative for fever, chills, diaphoresis, activity change, appetite change, fatigue and unexpected weight change.  HENT: Negative.   Eyes: Negative.   Respiratory: Negative for cough, chest tightness, shortness of breath, wheezing and stridor.   Cardiovascular: Negative.   Gastrointestinal: Negative for nausea, vomiting, abdominal pain, diarrhea, constipation and blood in stool.  Endocrine: Negative.   Genitourinary: Negative.   Musculoskeletal: Positive for arthralgias. Negative for myalgias, back pain, joint swelling, gait problem and stiffness.  Skin: Negative for color change, itching, pallor, rash and wound.  Neurological: Positive for tingling (chronically in UE and LE) and numbness (chronically in UE and LE). Negative for dizziness, tremors, seizures, syncope, facial asymmetry, speech difficulty, weakness, light-headedness and headaches.  Hematological: Negative for adenopathy. Does not bruise/bleed easily.  Psychiatric/Behavioral: Negative.        Objective:   Physical Exam  Vitals reviewed. Constitutional: He is oriented to person, place, and time. He  appears well-developed and well-nourished. No distress.  HENT:  Head: Normocephalic and atraumatic.  Mouth/Throat: Oropharynx is clear and moist. No oropharyngeal exudate.  Eyes: Conjunctivae are normal. Right eye exhibits no discharge. Left eye exhibits no discharge. No scleral icterus.  Neck: Normal range of motion. Neck supple. No JVD present. No tracheal deviation present. No thyromegaly present.  Cardiovascular: Normal rate, regular rhythm, normal heart sounds and intact distal pulses.  Exam reveals no gallop and no friction rub.   No murmur heard. Pulmonary/Chest: Effort normal and breath sounds normal. No stridor. No respiratory distress. He has no wheezes. He has no rales. He exhibits no tenderness.  Abdominal: Soft. Bowel sounds are normal. He exhibits no distension and no mass. There is no tenderness. There is no rebound and no guarding.  Musculoskeletal: He exhibits no edema and no tenderness.       Right shoulder: He exhibits decreased range of motion. He exhibits no tenderness, no bony tenderness, no swelling, no effusion, no crepitus, no deformity, no laceration, no pain, no spasm, normal pulse and normal strength.  Lymphadenopathy:    He has no cervical adenopathy.  Neurological: He is oriented to person, place, and time.  Skin: Skin is warm and dry. No rash noted. He is not diaphoretic. No erythema. No pallor.  Psychiatric: He has a normal mood and affect. His behavior is normal. Judgment and thought content normal.      Lab Results  Component Value Date   WBC 4.1* 01/24/2012   HGB 13.6 01/24/2012   HCT 41.7 01/24/2012   PLT 171.0 01/24/2012   GLUCOSE 94 10/23/2012   CHOL 274* 10/23/2012   TRIG 194.0* 10/23/2012   HDL 55.50 10/23/2012   LDLDIRECT 186.5 10/23/2012  ALT 18 10/23/2012   AST 21 10/23/2012   NA 141 10/23/2012   K 4.4 10/23/2012   CL 103 10/23/2012   CREATININE 1.0 10/23/2012   BUN 13 10/23/2012   CO2 30 10/23/2012   TSH 0.67 01/24/2012   PSA 3.19  01/24/2012      Assessment & Plan:

## 2013-01-22 NOTE — Assessment & Plan Note (Addendum)
He did not tolerate prolia He does not want any therapy for this as this time

## 2013-01-22 NOTE — Patient Instructions (Signed)
Shoulder Pain The shoulder is the joint that connects your arms to your body. The bones that form the shoulder joint include the upper arm bone (humerus), the shoulder blade (scapula), and the collarbone (clavicle). The top of the humerus is shaped like a ball and fits into a rather flat socket on the scapula (glenoid cavity). A combination of muscles and strong, fibrous tissues that connect muscles to bones (tendons) support your shoulder joint and hold the ball in the socket. Small, fluid-filled sacs (bursae) are located in different areas of the joint. They act as cushions between the bones and the overlying soft tissues and help reduce friction between the gliding tendons and the bone as you move your arm. Your shoulder joint allows a wide range of motion in your arm. This range of motion allows you to do things like scratch your back or throw a ball. However, this range of motion also makes your shoulder more prone to pain from overuse and injury. Causes of shoulder pain can originate from both injury and overuse and usually can be grouped in the following four categories:  Redness, swelling, and pain (inflammation) of the tendon (tendinitis) or the bursae (bursitis).  Instability, such as a dislocation of the joint.  Inflammation of the joint (arthritis).  Broken bone (fracture). HOME CARE INSTRUCTIONS   Apply ice to the sore area.  Put ice in a plastic bag.  Place a towel between your skin and the bag.  Leave the ice on for 15 to 20 minutes, 3 to 4 times per day for the first 2 days.  If you have a shoulder sling or immobilizer, wear it as long as your caregiver instructs. Only remove it to shower or bathe. Move your arm as little as possible, but keep your hand moving to prevent swelling.  Only take over-the-counter or prescription medicines for pain, discomfort, or fever as directed by your caregiver. SEEK MEDICAL CARE IF:   Your shoulder pain increases, or new pain develops in  your arm, hand, or fingers.  Your hand or fingers become cold and numb.  Your pain is not relieved with medicines. SEEK IMMEDIATE MEDICAL CARE IF:   Your arm, hand, or fingers are numb or tingling.  Your arm, hand, or fingers are significantly swollen or turn white or blue. MAKE SURE YOU:   Understand these instructions.  Will watch your condition.  Will get help right away if you are not doing well or get worse. Document Released: 08/09/2005 Document Revised: 01/22/2012 Document Reviewed: 10/14/2011 [employer] Patient Information 2013 [employer].  

## 2013-01-22 NOTE — Assessment & Plan Note (Signed)
His BP is well controlled 

## 2013-06-27 DIAGNOSIS — H25019 Cortical age-related cataract, unspecified eye: Secondary | ICD-10-CM | POA: Diagnosis not present

## 2013-06-27 DIAGNOSIS — H2510 Age-related nuclear cataract, unspecified eye: Secondary | ICD-10-CM | POA: Diagnosis not present

## 2013-07-20 DIAGNOSIS — Z23 Encounter for immunization: Secondary | ICD-10-CM | POA: Diagnosis not present

## 2013-08-28 ENCOUNTER — Other Ambulatory Visit (INDEPENDENT_AMBULATORY_CARE_PROVIDER_SITE_OTHER): Payer: Medicare Other

## 2013-08-28 ENCOUNTER — Encounter: Payer: Self-pay | Admitting: Internal Medicine

## 2013-08-28 ENCOUNTER — Ambulatory Visit (INDEPENDENT_AMBULATORY_CARE_PROVIDER_SITE_OTHER): Payer: Medicare Other | Admitting: Internal Medicine

## 2013-08-28 VITALS — BP 132/90 | HR 67 | Temp 97.4°F | Resp 16 | Ht 68.0 in | Wt 174.2 lb

## 2013-08-28 DIAGNOSIS — M810 Age-related osteoporosis without current pathological fracture: Secondary | ICD-10-CM

## 2013-08-28 DIAGNOSIS — I10 Essential (primary) hypertension: Secondary | ICD-10-CM

## 2013-08-28 DIAGNOSIS — E785 Hyperlipidemia, unspecified: Secondary | ICD-10-CM

## 2013-08-28 DIAGNOSIS — E782 Mixed hyperlipidemia: Secondary | ICD-10-CM

## 2013-08-28 DIAGNOSIS — R748 Abnormal levels of other serum enzymes: Secondary | ICD-10-CM

## 2013-08-28 DIAGNOSIS — Z0000 Encounter for general adult medical examination without abnormal findings: Secondary | ICD-10-CM

## 2013-08-28 DIAGNOSIS — N401 Enlarged prostate with lower urinary tract symptoms: Secondary | ICD-10-CM | POA: Diagnosis not present

## 2013-08-28 LAB — CBC WITH DIFFERENTIAL/PLATELET
Basophils Relative: 1.3 % (ref 0.0–3.0)
Eosinophils Relative: 4 % (ref 0.0–5.0)
Lymphocytes Relative: 24.2 % (ref 12.0–46.0)
MCV: 82.3 fl (ref 78.0–100.0)
Monocytes Absolute: 0.5 10*3/uL (ref 0.1–1.0)
Neutrophils Relative %: 60.3 % (ref 43.0–77.0)
RBC: 4.84 Mil/uL (ref 4.22–5.81)
WBC: 4.5 10*3/uL (ref 4.5–10.5)

## 2013-08-28 LAB — COMPREHENSIVE METABOLIC PANEL
ALT: 77 U/L — ABNORMAL HIGH (ref 0–53)
AST: 68 U/L — ABNORMAL HIGH (ref 0–37)
Albumin: 4.2 g/dL (ref 3.5–5.2)
CO2: 29 mEq/L (ref 19–32)
Chloride: 104 mEq/L (ref 96–112)
Creatinine, Ser: 1.2 mg/dL (ref 0.4–1.5)
Sodium: 142 mEq/L (ref 135–145)
Total Bilirubin: 1 mg/dL (ref 0.3–1.2)

## 2013-08-28 LAB — LIPID PANEL
LDL Cholesterol: 93 mg/dL (ref 0–99)
Total CHOL/HDL Ratio: 3

## 2013-08-28 LAB — URINALYSIS, ROUTINE W REFLEX MICROSCOPIC
Nitrite: NEGATIVE
Specific Gravity, Urine: 1.025 (ref 1.000–1.030)
Total Protein, Urine: NEGATIVE
pH: 6 (ref 5.0–8.0)

## 2013-08-28 MED ORDER — ALENDRONATE SODIUM 70 MG PO TBEF: 1.0000 | EFFERVESCENT_TABLET | ORAL | Status: DC

## 2013-08-28 NOTE — Progress Notes (Signed)
Subjective:    Patient ID: Richard Blake, male    DOB: [birth year], 67 y.o.   MRN: [id number]  Hyperlipidemia This is a chronic problem. The current episode started more than 1 year ago. The problem is controlled. Recent lipid tests were reviewed and are variable. He has no history of chronic renal disease, diabetes, hypothyroidism, liver disease, obesity or nephrotic syndrome. There are no known factors aggravating his hyperlipidemia. Pertinent negatives include no chest pain, focal sensory loss, focal weakness, leg pain, myalgias or shortness of breath. Current antihyperlipidemic treatment includes statins and bile acid squestrants. The current treatment provides moderate improvement of lipids. Compliance problems include medication side effects.       Review of Systems  Constitutional: Negative.  Negative for fever, chills, diaphoresis, appetite change and fatigue.  HENT: Negative.   Eyes: Negative.   Respiratory: Negative.  Negative for cough, choking, chest tightness, shortness of breath, wheezing and stridor.   Cardiovascular: Negative.  Negative for chest pain, palpitations and leg swelling.  Gastrointestinal: Negative.  Negative for nausea, vomiting, abdominal pain, diarrhea and constipation.  Endocrine: Negative.   Genitourinary: Negative.   Musculoskeletal: Negative.  Negative for arthralgias, back pain, gait problem, joint swelling, myalgias, neck pain and neck stiffness.  Skin: Negative.   Allergic/Immunologic: Negative.   Neurological: Negative.  Negative for dizziness, focal weakness and weakness.  Hematological: Negative.  Negative for adenopathy. Does not bruise/bleed easily.  Psychiatric/Behavioral: Negative.        Objective:   Physical Exam  Vitals reviewed. Constitutional: He is oriented to person, place, and time. He appears well-developed and well-nourished. No distress.  HENT:  Head: Normocephalic and atraumatic.  Mouth/Throat: Oropharynx is clear and  moist. No oropharyngeal exudate.  Eyes: Conjunctivae are normal. Right eye exhibits no discharge. Left eye exhibits no discharge. No scleral icterus.  Neck: Normal range of motion. Neck supple. No JVD present. No tracheal deviation present. No thyromegaly present.  Cardiovascular: Normal rate, regular rhythm, normal heart sounds and intact distal pulses.  Exam reveals no gallop and no friction rub.   No murmur heard. Pulmonary/Chest: Effort normal and breath sounds normal. No stridor. No respiratory distress. He has no wheezes. He has no rales. He exhibits no tenderness.  Abdominal: Soft. Bowel sounds are normal. He exhibits no distension and no mass. There is no tenderness. There is no rebound and no guarding. Hernia confirmed negative in the right inguinal area and confirmed negative in the left inguinal area.  Genitourinary: Rectum normal, prostate normal, testes normal and penis normal. Rectal exam shows no external hemorrhoid, no internal hemorrhoid, no fissure, no mass, no tenderness and anal tone normal. Guaiac negative stool. Prostate is not enlarged and not tender. Right testis shows no mass, no swelling and no tenderness. Right testis is descended. Left testis shows no mass, no swelling and no tenderness. Left testis is descended. Circumcised. No penile erythema or penile tenderness. No discharge found.  Musculoskeletal: Normal range of motion. He exhibits no edema and no tenderness.  Lymphadenopathy:    He has no cervical adenopathy.       Right: No inguinal adenopathy present.       Left: No inguinal adenopathy present.  Neurological: He is oriented to person, place, and time.  Skin: Skin is warm and dry. No rash noted. He is not diaphoretic. No erythema. No pallor.  Psychiatric: He has a normal mood and affect. His behavior is normal. Judgment and thought content normal.     Lab Results  Component  Value Date   WBC 4.1* 01/24/2012   HGB 13.6 01/24/2012   HCT 41.7 01/24/2012   PLT  171.0 01/24/2012   GLUCOSE 94 10/23/2012   CHOL 274* 10/23/2012   TRIG 194.0* 10/23/2012   HDL 55.50 10/23/2012   LDLDIRECT 186.5 10/23/2012   ALT 18 10/23/2012   AST 21 10/23/2012   NA 141 10/23/2012   K 4.4 10/23/2012   CL 103 10/23/2012   CREATININE 1.0 10/23/2012   BUN 13 10/23/2012   CO2 30 10/23/2012   TSH 0.67 01/24/2012   PSA 3.19 01/24/2012       Assessment & Plan:

## 2013-08-28 NOTE — Patient Instructions (Signed)
Health Maintenance, Males A healthy lifestyle and preventative care can promote health and wellness.  Maintain regular health, dental, and eye exams.  Eat a healthy diet. Foods like vegetables, fruits, whole grains, low-fat dairy products, and lean protein foods contain the nutrients you need without too many calories. Decrease your intake of foods high in solid fats, added sugars, and salt. Get information about a proper diet from your caregiver, if necessary.  Regular physical exercise is one of the most important things you can do for your health. Most adults should get at least 150 minutes of moderate-intensity exercise (any activity that increases your heart rate and causes you to sweat) each week. In addition, most adults need muscle-strengthening exercises on 2 or more days a week.   Maintain a healthy weight. The body mass index (BMI) is a screening tool to identify possible weight problems. It provides an estimate of body fat based on height and weight. Your caregiver can help determine your BMI, and can help you achieve or maintain a healthy weight. For adults 20 years and older:  A BMI below 18.5 is considered underweight.  A BMI of 18.5 to 24.9 is normal.  A BMI of 25 to 29.9 is considered overweight.  A BMI of 30 and above is considered obese.  Maintain normal blood lipids and cholesterol by exercising and minimizing your intake of saturated fat. Eat a balanced diet with plenty of fruits and vegetables. Blood tests for lipids and cholesterol should begin at age 20 and be repeated every 5 years. If your lipid or cholesterol levels are high, you are over 50, or you are a high risk for heart disease, you may need your cholesterol levels checked more frequently.Ongoing high lipid and cholesterol levels should be treated with medicines, if diet and exercise are not effective.  If you smoke, find out from your caregiver how to quit. If you do not use tobacco, do not start.  If you  choose to drink alcohol, do not exceed 2 drinks per day. One drink is considered to be 12 ounces (355 mL) of beer, 5 ounces (148 mL) of wine, or 1.5 ounces (44 mL) of liquor.  Avoid use of street drugs. Do not share needles with anyone. Ask for help if you need support or instructions about stopping the use of drugs.  High blood pressure causes heart disease and increases the risk of stroke. Blood pressure should be checked at least every 1 to 2 years. Ongoing high blood pressure should be treated with medicines if weight loss and exercise are not effective.  If you are 45 to 66 years old, ask your caregiver if you should take aspirin to prevent heart disease.  Diabetes screening involves taking a blood sample to check your fasting blood sugar level. This should be done once every 3 years, after age 45, if you are within normal weight and without risk factors for diabetes. Testing should be considered at a younger age or be carried out more frequently if you are overweight and have at least 1 risk factor for diabetes.  Colorectal cancer can be detected and often prevented. Most routine colorectal cancer screening begins at the age of 50 and continues through age 75. However, your caregiver may recommend screening at an earlier age if you have risk factors for colon cancer. On a yearly basis, your caregiver may provide home test kits to check for hidden blood in the stool. Use of a small camera at the end of a tube,   to directly examine the colon (sigmoidoscopy or colonoscopy), can detect the earliest forms of colorectal cancer. Talk to your caregiver about this at age 50, when routine screening begins. Direct examination of the colon should be repeated every 5 to 10 years through age 75, unless early forms of pre-cancerous polyps or small growths are found.  Hepatitis C blood testing is recommended for all people born from 1945 through 1965 and any individual with known risks for hepatitis C.  Healthy  men should no longer receive prostate-specific antigen (PSA) blood tests as part of routine cancer screening. Consult with your caregiver about prostate cancer screening.  Testicular cancer screening is not recommended for adolescents or adult males who have no symptoms. Screening includes self-exam, caregiver exam, and other screening tests. Consult with your caregiver about any symptoms you have or any concerns you have about testicular cancer.  Practice safe sex. Use condoms and avoid high-risk sexual practices to reduce the spread of sexually transmitted infections (STIs).  Use sunscreen with a sun protection factor (SPF) of 30 or greater. Apply sunscreen liberally and repeatedly throughout the day. You should seek shade when your shadow is shorter than you. Protect yourself by wearing long sleeves, pants, a wide-brimmed hat, and sunglasses year round, whenever you are outdoors.  Notify your caregiver of new moles or changes in moles, especially if there is a change in shape or color. Also notify your caregiver if a mole is larger than the size of a pencil eraser.  A one-time screening for abdominal aortic aneurysm (AAA) and surgical repair of large AAAs by sound wave imaging (ultrasonography) is recommended for ages 65 to 75 years who are current or former smokers.  Stay current with your immunizations. Document Released: 04/27/2008 Document Revised: 01/22/2012 Document Reviewed: 03/27/2011 [employer] Patient Information 2014 [employer]. Hypertension As your heart beats, it forces blood through your arteries. This force is your blood pressure. If the pressure is too high, it is called hypertension (HTN) or high blood pressure. HTN is dangerous because you may have it and not know it. High blood pressure may mean that your heart has to work harder to pump blood. Your arteries may be narrow or stiff. The extra work puts you at risk for heart disease, stroke, and other problems.  Blood pressure  consists of two numbers, a higher number over a lower, 110/72, for example. It is stated as "110 over 72." The ideal is below 120 for the top number (systolic) and under 80 for the bottom (diastolic). Write down your blood pressure today. You should pay close attention to your blood pressure if you have certain conditions such as:  Heart failure.  Prior heart attack.  Diabetes  Chronic kidney disease.  Prior stroke.  Multiple risk factors for heart disease. To see if you have HTN, your blood pressure should be measured while you are seated with your arm held at the level of the heart. It should be measured at least twice. A one-time elevated blood pressure reading (especially in the Emergency Department) does not mean that you need treatment. There may be conditions in which the blood pressure is different between your right and left arms. It is important to see your caregiver soon for a recheck. Most people have essential hypertension which means that there is not a specific cause. This type of high blood pressure may be lowered by changing lifestyle factors such as:  Stress.  Smoking.  Lack of exercise.  Excessive weight.  Drug/tobacco/alcohol use.    Eating less salt. Most people do not have symptoms from high blood pressure until it has caused damage to the body. Effective treatment can often prevent, delay or reduce that damage. TREATMENT  When a cause has been identified, treatment for high blood pressure is directed at the cause. There are a large number of medications to treat HTN. These fall into several categories, and your caregiver will help you select the medicines that are best for you. Medications may have side effects. You should review side effects with your caregiver. If your blood pressure stays high after you have made lifestyle changes or started on medicines,   Your medication(s) may need to be changed.  Other problems may need to be addressed.  Be certain you  understand your prescriptions, and know how and when to take your medicine.  Be sure to follow up with your caregiver within the time frame advised (usually within two weeks) to have your blood pressure rechecked and to review your medications.  If you are taking more than one medicine to lower your blood pressure, make sure you know how and at what times they should be taken. Taking two medicines at the same time can result in blood pressure that is too low. SEEK IMMEDIATE MEDICAL CARE IF:  You develop a severe headache, blurred or changing vision, or confusion.  You have unusual weakness or numbness, or a faint feeling.  You have severe chest or abdominal pain, vomiting, or breathing problems. MAKE SURE YOU:   Understand these instructions.  Will watch your condition.  Will get help right away if you are not doing well or get worse. Document Released: 10/30/2005 Document Revised: 01/22/2012 Document Reviewed: 06/19/2008 [employer] Patient Information 2014 [employer].  

## 2013-08-29 ENCOUNTER — Encounter: Payer: Self-pay | Admitting: Internal Medicine

## 2013-08-29 NOTE — Assessment & Plan Note (Addendum)

## 2013-08-29 NOTE — Assessment & Plan Note (Signed)
He has decided not take prolia I have asked him to try binosto

## 2013-08-29 NOTE — Assessment & Plan Note (Signed)
He did not tolerate welchol He is doing well on crestor FLP today---> goal achieved

## 2013-08-29 NOTE — Assessment & Plan Note (Signed)
FLP today 

## 2013-08-29 NOTE — Assessment & Plan Note (Signed)
I have ordered an U/S to see what his liver looks like I have also asked him to return for further testing

## 2013-09-01 ENCOUNTER — Ambulatory Visit: Payer: Medicare Other | Admitting: Internal Medicine

## 2013-09-02 ENCOUNTER — Encounter: Payer: Self-pay | Admitting: Internal Medicine

## 2013-09-02 DIAGNOSIS — H25049 Posterior subcapsular polar age-related cataract, unspecified eye: Secondary | ICD-10-CM | POA: Diagnosis not present

## 2013-09-02 DIAGNOSIS — H02839 Dermatochalasis of unspecified eye, unspecified eyelid: Secondary | ICD-10-CM | POA: Diagnosis not present

## 2013-09-02 DIAGNOSIS — H25019 Cortical age-related cataract, unspecified eye: Secondary | ICD-10-CM | POA: Diagnosis not present

## 2013-09-02 DIAGNOSIS — H18419 Arcus senilis, unspecified eye: Secondary | ICD-10-CM | POA: Diagnosis not present

## 2013-09-02 DIAGNOSIS — H2510 Age-related nuclear cataract, unspecified eye: Secondary | ICD-10-CM | POA: Diagnosis not present

## 2013-09-02 NOTE — Addendum Note (Signed)
Addended by: [name] on: 09/02/2013 10:30 AM   Modules accepted: Orders, Medications

## 2013-09-04 ENCOUNTER — Ambulatory Visit
Admission: RE | Admit: 2013-09-04 | Discharge: 2013-09-04 | Disposition: A | Discharge status: Home / Self Care | Payer: Medicare Other | Source: Ambulatory Visit | Attending: Internal Medicine | Admitting: Internal Medicine

## 2013-09-04 ENCOUNTER — Encounter: Payer: Self-pay | Admitting: Internal Medicine

## 2013-09-04 DIAGNOSIS — N281 Cyst of kidney, acquired: Secondary | ICD-10-CM | POA: Diagnosis not present

## 2013-09-04 DIAGNOSIS — R748 Abnormal levels of other serum enzymes: Secondary | ICD-10-CM

## 2013-09-09 ENCOUNTER — Encounter: Payer: Self-pay | Admitting: Internal Medicine

## 2013-09-09 ENCOUNTER — Telehealth: Payer: Self-pay

## 2013-09-09 NOTE — Telephone Encounter (Signed)
Patient called triage asking about the Labs and Imaging results done on 08/28/2013 that were released to my chart. The patient said that he has be unable to get on to my chart. Patient was called back but did not answer. Left message for patient to call back to discuss my chart and results.

## 2013-10-14 HISTORY — PX: CATARACT EXTRACTION: SUR2

## 2013-11-10 DIAGNOSIS — H2510 Age-related nuclear cataract, unspecified eye: Secondary | ICD-10-CM | POA: Diagnosis not present

## 2013-11-10 DIAGNOSIS — H269 Unspecified cataract: Secondary | ICD-10-CM | POA: Diagnosis not present

## 2013-11-11 DIAGNOSIS — H2510 Age-related nuclear cataract, unspecified eye: Secondary | ICD-10-CM | POA: Diagnosis not present

## 2013-11-24 DIAGNOSIS — H2510 Age-related nuclear cataract, unspecified eye: Secondary | ICD-10-CM | POA: Diagnosis not present

## 2013-11-24 DIAGNOSIS — H269 Unspecified cataract: Secondary | ICD-10-CM | POA: Diagnosis not present

## 2013-11-24 HISTORY — PX: CATARACT EXTRACTION: SUR2

## 2014-02-12 ENCOUNTER — Other Ambulatory Visit: Payer: Self-pay | Admitting: Internal Medicine

## 2014-02-19 ENCOUNTER — Other Ambulatory Visit (INDEPENDENT_AMBULATORY_CARE_PROVIDER_SITE_OTHER): Payer: Medicare Other

## 2014-02-19 ENCOUNTER — Encounter: Payer: Self-pay | Admitting: Internal Medicine

## 2014-02-19 ENCOUNTER — Ambulatory Visit (INDEPENDENT_AMBULATORY_CARE_PROVIDER_SITE_OTHER): Payer: Medicare Other | Admitting: Internal Medicine

## 2014-02-19 VITALS — BP 140/90 | HR 73 | Temp 98.3°F | Resp 16 | Wt 180.8 lb

## 2014-02-19 DIAGNOSIS — Z0000 Encounter for general adult medical examination without abnormal findings: Secondary | ICD-10-CM

## 2014-02-19 DIAGNOSIS — I10 Essential (primary) hypertension: Secondary | ICD-10-CM | POA: Diagnosis not present

## 2014-02-19 DIAGNOSIS — R740 Nonspecific elevation of levels of transaminase and lactic acid dehydrogenase [LDH]: Principal | ICD-10-CM

## 2014-02-19 DIAGNOSIS — R972 Elevated prostate specific antigen [PSA]: Secondary | ICD-10-CM | POA: Insufficient documentation

## 2014-02-19 DIAGNOSIS — R7401 Elevation of levels of liver transaminase levels: Secondary | ICD-10-CM | POA: Diagnosis not present

## 2014-02-19 DIAGNOSIS — E782 Mixed hyperlipidemia: Secondary | ICD-10-CM

## 2014-02-19 DIAGNOSIS — R7402 Elevation of levels of lactic acid dehydrogenase (LDH): Secondary | ICD-10-CM

## 2014-02-19 LAB — COMPREHENSIVE METABOLIC PANEL
ALK PHOS: 97 U/L (ref 39–117)
ALT: 24 U/L (ref 0–53)
AST: 31 U/L (ref 0–37)
Albumin: 4.2 g/dL (ref 3.5–5.2)
BILIRUBIN TOTAL: 0.9 mg/dL (ref 0.3–1.2)
BUN: 14 mg/dL (ref 6–23)
CO2: 29 meq/L (ref 19–32)
CREATININE: 1.2 mg/dL (ref 0.4–1.5)
Calcium: 9.4 mg/dL (ref 8.4–10.5)
Chloride: 106 mEq/L (ref 96–112)
GFR: 75.61 mL/min (ref >60.00)
Glucose, Bld: 83 mg/dL (ref 70–99)
Potassium: 4 mEq/L (ref 3.5–5.1)
SODIUM: 141 meq/L (ref 135–145)
Total Protein: 7.6 g/dL (ref 6.0–8.3)

## 2014-02-19 LAB — PSA: PSA: 3.7 ng/mL (ref 0.10–4.00)

## 2014-02-19 LAB — LIPID PANEL
CHOL/HDL RATIO: 3
Cholesterol: 191 mg/dL (ref 0–200)
HDL: 64.7 mg/dL (ref >39.00)
LDL Cholesterol: 109 mg/dL — ABNORMAL HIGH (ref 0–99)
TRIGLYCERIDES: 89 mg/dL (ref 0.0–149.0)
VLDL: 17.8 mg/dL (ref 0.0–40.0)

## 2014-02-19 MED ORDER — AMLODIPINE BESYLATE 10 MG PO TABS: 10.0000 mg | ORAL_TABLET | Freq: Every day | ORAL | Status: DC

## 2014-02-19 MED ORDER — LOSARTAN POTASSIUM 100 MG PO TABS: 100.0000 mg | ORAL_TABLET | Freq: Every day | ORAL | Status: DC

## 2014-02-19 NOTE — Assessment & Plan Note (Signed)
I will recheck his PSA today and if this is high and going higher I will refer to urology

## 2014-02-19 NOTE — Progress Notes (Signed)
Pre visit review using our clinic review tool, if applicable. No additional management support is needed unless otherwise documented below in the visit note. 

## 2014-02-19 NOTE — Patient Instructions (Signed)

## 2014-02-19 NOTE — Progress Notes (Signed)
Subjective:    Patient ID: Richard Blake, male    DOB: [birth year], 68 y.o.   MRN: [id number]  Hyperlipidemia This is a chronic problem. The current episode started more than 1 year ago. The problem is controlled. Recent lipid tests were reviewed and are variable. Exacerbating diseases include liver disease. He has no history of chronic renal disease, diabetes, hypothyroidism, obesity or nephrotic syndrome. There are no known factors aggravating his hyperlipidemia. Pertinent negatives include no chest pain, focal sensory loss, focal weakness, leg pain, myalgias or shortness of breath. Current antihyperlipidemic treatment includes statins. The current treatment provides moderate improvement of lipids. There are no compliance problems.       Review of Systems  Constitutional: Positive for unexpected weight change (wt gain). Negative for fever, chills, diaphoresis, appetite change and fatigue.  HENT: Negative.   Eyes: Negative.   Respiratory: Negative.  Negative for cough, choking, chest tightness, shortness of breath and wheezing.   Cardiovascular: Negative.  Negative for chest pain, palpitations and leg swelling.  Gastrointestinal: Negative.  Negative for nausea, vomiting, abdominal pain, diarrhea and constipation.  Endocrine: Negative.   Genitourinary: Negative.   Musculoskeletal: Negative.  Negative for arthralgias and myalgias.  Allergic/Immunologic: Negative.   Neurological: Negative.  Negative for focal weakness.  Hematological: Negative.  Negative for adenopathy. Does not bruise/bleed easily.  Psychiatric/Behavioral: Negative.        Objective:   Physical Exam  Vitals reviewed. Constitutional: He is oriented to person, place, and time. He appears well-developed and well-nourished. No distress.  HENT:  Head: Normocephalic and atraumatic.  Mouth/Throat: Oropharynx is clear and moist. No oropharyngeal exudate.  Eyes: Conjunctivae are normal. Right eye exhibits no discharge.  Left eye exhibits no discharge. No scleral icterus.  Neck: Normal range of motion. Neck supple. No JVD present. No tracheal deviation present. No thyromegaly present.  Cardiovascular: Normal rate, regular rhythm, normal heart sounds and intact distal pulses.  Exam reveals no gallop and no friction rub.   No murmur heard. Pulmonary/Chest: Effort normal and breath sounds normal. No stridor. No respiratory distress. He has no wheezes. He has no rales. He exhibits no tenderness.  Abdominal: Soft. Normal appearance and bowel sounds are normal. He exhibits no distension and no mass. There is no hepatosplenomegaly, splenomegaly or hepatomegaly. There is no tenderness. There is no rebound, no guarding and no CVA tenderness. A hernia is present. Hernia confirmed positive in the right inguinal area (this was present only with valsalva ). Hernia confirmed negative in the ventral area and confirmed negative in the left inguinal area.  Musculoskeletal: Normal range of motion. He exhibits no edema and no tenderness.  Lymphadenopathy:    He has no cervical adenopathy.  Neurological: He is oriented to person, place, and time.  Skin: Skin is warm and dry. No rash noted. He is not diaphoretic. No erythema. No pallor.     Lab Results  Component Value Date   WBC 4.5 08/28/2013   HGB 13.1 08/28/2013   HCT 39.9 08/28/2013   PLT 199.0 08/28/2013   GLUCOSE 88 08/28/2013   CHOL 177 08/28/2013   TRIG 84.0 08/28/2013   HDL 67.30 08/28/2013   LDLDIRECT 186.5 10/23/2012   LDLCALC 93 08/28/2013   ALT 77* 08/28/2013   AST 68* 08/28/2013   NA 142 08/28/2013   K 4.4 08/28/2013   CL 104 08/28/2013   CREATININE 1.2 08/28/2013   BUN 16 08/28/2013   CO2 29 08/28/2013   TSH 0.62 08/28/2013   PSA 4.34*  08/28/2013       Assessment & Plan:

## 2014-02-19 NOTE — Assessment & Plan Note (Addendum)
His BP is well controlled Today I will monitor his lytes and renal function

## 2014-02-19 NOTE — Assessment & Plan Note (Signed)
I will recheck his LFT's today and if the enzymes are still elevated will d'c the statin His liver u/s was normal Will screen him for viral hepatitis today as well I am concerned that this may be sarcoid but will observe for now

## 2014-02-20 ENCOUNTER — Encounter: Payer: Self-pay | Admitting: Internal Medicine

## 2014-02-20 LAB — HEPATITIS B SURFACE ANTIBODY,QUALITATIVE: HEP B S AB: NEGATIVE

## 2014-02-20 LAB — HEPATITIS C ANTIBODY: HCV AB: NEGATIVE

## 2014-02-20 LAB — HEPATITIS A ANTIBODY, TOTAL: HEP A TOTAL AB: NONREACTIVE

## 2014-02-20 LAB — HEPATITIS B CORE ANTIBODY, TOTAL: HEP B C TOTAL AB: NONREACTIVE

## 2014-03-10 DIAGNOSIS — H2000 Unspecified acute and subacute iridocyclitis: Secondary | ICD-10-CM | POA: Diagnosis not present

## 2014-05-13 DIAGNOSIS — H2000 Unspecified acute and subacute iridocyclitis: Secondary | ICD-10-CM | POA: Diagnosis not present

## 2015-01-06 ENCOUNTER — Ambulatory Visit: Payer: Medicare Other | Admitting: Internal Medicine

## 2015-01-12 ENCOUNTER — Other Ambulatory Visit (INDEPENDENT_AMBULATORY_CARE_PROVIDER_SITE_OTHER): Payer: Medicare Other

## 2015-01-12 ENCOUNTER — Ambulatory Visit (INDEPENDENT_AMBULATORY_CARE_PROVIDER_SITE_OTHER)
Admission: RE | Admit: 2015-01-12 | Discharge: 2015-01-12 | Disposition: A | Discharge status: Home / Self Care | Payer: Medicare Other | Source: Ambulatory Visit | Attending: Internal Medicine | Admitting: Internal Medicine

## 2015-01-12 ENCOUNTER — Encounter: Payer: Self-pay | Admitting: Internal Medicine

## 2015-01-12 ENCOUNTER — Ambulatory Visit (INDEPENDENT_AMBULATORY_CARE_PROVIDER_SITE_OTHER): Payer: Medicare Other | Admitting: Internal Medicine

## 2015-01-12 VITALS — BP 138/86 | HR 78 | Temp 98.1°F | Resp 16 | Wt 184.0 lb

## 2015-01-12 DIAGNOSIS — R972 Elevated prostate specific antigen [PSA]: Secondary | ICD-10-CM | POA: Diagnosis not present

## 2015-01-12 DIAGNOSIS — J8410 Pulmonary fibrosis, unspecified: Secondary | ICD-10-CM

## 2015-01-12 DIAGNOSIS — N400 Enlarged prostate without lower urinary tract symptoms: Secondary | ICD-10-CM

## 2015-01-12 DIAGNOSIS — I10 Essential (primary) hypertension: Secondary | ICD-10-CM | POA: Diagnosis not present

## 2015-01-12 DIAGNOSIS — E785 Hyperlipidemia, unspecified: Secondary | ICD-10-CM | POA: Diagnosis not present

## 2015-01-12 DIAGNOSIS — D869 Sarcoidosis, unspecified: Secondary | ICD-10-CM

## 2015-01-12 DIAGNOSIS — M810 Age-related osteoporosis without current pathological fracture: Secondary | ICD-10-CM

## 2015-01-12 DIAGNOSIS — Z23 Encounter for immunization: Secondary | ICD-10-CM

## 2015-01-12 DIAGNOSIS — E782 Mixed hyperlipidemia: Secondary | ICD-10-CM

## 2015-01-12 DIAGNOSIS — K449 Diaphragmatic hernia without obstruction or gangrene: Secondary | ICD-10-CM | POA: Diagnosis not present

## 2015-01-12 LAB — CBC WITH DIFFERENTIAL/PLATELET
Basophils Absolute: 0 10*3/uL (ref 0.0–0.1)
Basophils Relative: 0.5 % (ref 0.0–3.0)
EOS PCT: 2.4 % (ref 0.0–5.0)
Eosinophils Absolute: 0.1 10*3/uL (ref 0.0–0.7)
HEMATOCRIT: 39.9 % (ref 39.0–52.0)
Hemoglobin: 13.1 g/dL (ref 13.0–17.0)
LYMPHS PCT: 22.7 % (ref 12.0–46.0)
Lymphs Abs: 1.1 10*3/uL (ref 0.7–4.0)
MCHC: 32.8 g/dL (ref 30.0–36.0)
MCV: 80.4 fl (ref 78.0–100.0)
MONOS PCT: 9.3 % (ref 3.0–12.0)
Monocytes Absolute: 0.5 10*3/uL (ref 0.1–1.0)
Neutro Abs: 3.2 10*3/uL (ref 1.4–7.7)
Neutrophils Relative %: 65.1 % (ref 43.0–77.0)
Platelets: 202 10*3/uL (ref 150.0–400.0)
RBC: 4.97 Mil/uL (ref 4.22–5.81)
RDW: 15.9 % — AB (ref 11.5–15.5)
WBC: 4.9 10*3/uL (ref 4.0–10.5)

## 2015-01-12 LAB — LIPID PANEL
Cholesterol: 178 mg/dL (ref 0–200)
HDL: 64.5 mg/dL (ref >39.00)
LDL Cholesterol: 95 mg/dL (ref 0–99)
NonHDL: 113.5
TRIGLYCERIDES: 93 mg/dL (ref 0.0–149.0)
Total CHOL/HDL Ratio: 3
VLDL: 18.6 mg/dL (ref 0.0–40.0)

## 2015-01-12 LAB — COMPREHENSIVE METABOLIC PANEL
ALBUMIN: 4.5 g/dL (ref 3.5–5.2)
ALT: 19 U/L (ref 0–53)
AST: 25 U/L (ref 0–37)
Alkaline Phosphatase: 129 U/L — ABNORMAL HIGH (ref 39–117)
BUN: 18 mg/dL (ref 6–23)
CO2: 28 mEq/L (ref 19–32)
Calcium: 9.3 mg/dL (ref 8.4–10.5)
Chloride: 106 mEq/L (ref 96–112)
Creatinine, Ser: 1.42 mg/dL (ref 0.40–1.50)
GFR: 63.89 mL/min (ref >60.00)
GLUCOSE: 93 mg/dL (ref 70–99)
POTASSIUM: 4.1 meq/L (ref 3.5–5.1)
SODIUM: 142 meq/L (ref 135–145)
TOTAL PROTEIN: 8 g/dL (ref 6.0–8.3)
Total Bilirubin: 0.7 mg/dL (ref 0.2–1.2)

## 2015-01-12 LAB — TSH: TSH: 1.19 u[IU]/mL (ref 0.35–4.50)

## 2015-01-12 LAB — PSA: PSA: 4.21 ng/mL — ABNORMAL HIGH (ref 0.10–4.00)

## 2015-01-12 MED ORDER — ROSUVASTATIN CALCIUM 10 MG PO TABS: 10.0000 mg | ORAL_TABLET | Freq: Every day | ORAL | Status: DC

## 2015-01-12 NOTE — Progress Notes (Signed)
   Subjective:    Patient ID: Richard Blake, male    DOB: February 04, 1946, 69 y.o.   MRN: [id number]  Hypertension This is a chronic problem. The current episode started more than 1 year ago. The problem is controlled. Associated symptoms include neck pain. Pertinent negatives include no anxiety, blurred vision, chest pain, headaches, malaise/fatigue, orthopnea, palpitations, peripheral edema, PND, shortness of breath or sweats. Past treatments include calcium channel blockers and angiotensin blockers. The current treatment provides moderate improvement. There are no compliance problems.   Hyperlipidemia Pertinent negatives include no chest pain, myalgias or shortness of breath.      Review of Systems  Constitutional: Negative.  Negative for fever, chills, malaise/fatigue, diaphoresis, appetite change and fatigue.  HENT: Negative.   Eyes: Negative.  Negative for blurred vision.  Respiratory: Negative.  Negative for cough, choking, shortness of breath and stridor.   Cardiovascular: Negative.  Negative for chest pain, palpitations, orthopnea, leg swelling and PND.  Gastrointestinal: Negative.  Negative for nausea, vomiting, abdominal pain, diarrhea, constipation and blood in stool.  Endocrine: Negative.   Genitourinary: Negative.   Musculoskeletal: Positive for arthralgias and neck pain. Negative for myalgias, back pain, joint swelling and neck stiffness.  Skin: Negative.  Negative for rash.  Allergic/Immunologic: Negative.   Neurological: Negative.  Negative for headaches.  Hematological: Negative.  Negative for adenopathy. Does not bruise/bleed easily.  Psychiatric/Behavioral: Negative.        Objective:   Physical Exam  Constitutional: He is oriented to person, place, and time. He appears well-developed and well-nourished. No distress.  HENT:  Head: Normocephalic and atraumatic.  Mouth/Throat: Oropharynx is clear and moist. No oropharyngeal exudate.  Eyes: Conjunctivae are normal.  Right eye exhibits no discharge. Left eye exhibits no discharge. No scleral icterus.  Neck: Normal range of motion. Neck supple. No JVD present. No tracheal deviation present. No thyromegaly present.  Cardiovascular: Normal rate, regular rhythm, normal heart sounds and intact distal pulses.  Exam reveals no gallop and no friction rub.   No murmur heard. Pulmonary/Chest: Effort normal and breath sounds normal. No stridor. No respiratory distress. He has no wheezes. He has no rales. He exhibits no tenderness.  Abdominal: Soft. Bowel sounds are normal. He exhibits no distension and no mass. There is no tenderness. There is no rebound and no guarding.  Musculoskeletal: Normal range of motion. He exhibits no edema or tenderness.  Lymphadenopathy:    He has no cervical adenopathy.  Neurological: He is oriented to person, place, and time.  Skin: Skin is warm and dry. No rash noted. He is not diaphoretic. No erythema. No pallor.  Psychiatric: He has a normal mood and affect. His behavior is normal. Judgment and thought content normal.  Vitals reviewed.   Lab Results  Component Value Date   WBC 4.9 01/12/2015   HGB 13.1 01/12/2015   HCT 39.9 01/12/2015   PLT 202.0 01/12/2015   GLUCOSE 93 01/12/2015   CHOL 178 01/12/2015   TRIG 93.0 01/12/2015   HDL 64.50 01/12/2015   LDLDIRECT 186.5 10/23/2012   LDLCALC 95 01/12/2015   ALT 19 01/12/2015   AST 25 01/12/2015   NA 142 01/12/2015   K 4.1 01/12/2015   CL 106 01/12/2015   CREATININE 1.42 01/12/2015   BUN 18 01/12/2015   CO2 28 01/12/2015   TSH 1.19 01/12/2015   PSA 4.21* 01/12/2015        Assessment & Plan:

## 2015-01-12 NOTE — Progress Notes (Signed)
Pre visit review using our clinic review tool, if applicable. No additional management support is needed unless otherwise documented below in the visit note. 

## 2015-01-12 NOTE — Assessment & Plan Note (Signed)
His PSA remains very slightly elevated but the trajectory is stable I do no think any further action is needed at this time

## 2015-01-12 NOTE — Assessment & Plan Note (Signed)
Based on s/s he is doing well Will recheck his CXR at this request

## 2015-01-12 NOTE — Assessment & Plan Note (Signed)
He is doing well on crestor Will recheck his FLP today

## 2015-01-12 NOTE — Assessment & Plan Note (Signed)
His BP is well controlled Lytes and renal function are stable 

## 2015-01-12 NOTE — Assessment & Plan Note (Signed)
I have asked him to see ENDO about this

## 2015-01-12 NOTE — Patient Instructions (Signed)

## 2015-01-13 ENCOUNTER — Encounter: Payer: Self-pay | Admitting: Internal Medicine

## 2015-01-14 ENCOUNTER — Other Ambulatory Visit: Payer: Self-pay | Admitting: Internal Medicine

## 2015-01-14 DIAGNOSIS — K449 Diaphragmatic hernia without obstruction or gangrene: Secondary | ICD-10-CM | POA: Insufficient documentation

## 2015-01-20 ENCOUNTER — Ambulatory Visit (INDEPENDENT_AMBULATORY_CARE_PROVIDER_SITE_OTHER): Payer: Medicare Other | Admitting: Endocrinology

## 2015-01-20 ENCOUNTER — Ambulatory Visit (INDEPENDENT_AMBULATORY_CARE_PROVIDER_SITE_OTHER)
Admission: RE | Admit: 2015-01-20 | Discharge: 2015-01-20 | Disposition: A | Discharge status: Home / Self Care | Payer: Medicare Other | Source: Ambulatory Visit | Attending: Internal Medicine | Admitting: Internal Medicine

## 2015-01-20 ENCOUNTER — Encounter: Payer: Self-pay | Admitting: Endocrinology

## 2015-01-20 VITALS — BP 134/78 | HR 77 | Temp 98.1°F | Ht 68.0 in | Wt 182.1 lb

## 2015-01-20 DIAGNOSIS — N209 Urinary calculus, unspecified: Secondary | ICD-10-CM | POA: Insufficient documentation

## 2015-01-20 DIAGNOSIS — N218 Other lower urinary tract calculus: Secondary | ICD-10-CM

## 2015-01-20 DIAGNOSIS — E559 Vitamin D deficiency, unspecified: Secondary | ICD-10-CM | POA: Insufficient documentation

## 2015-01-20 DIAGNOSIS — M810 Age-related osteoporosis without current pathological fracture: Secondary | ICD-10-CM

## 2015-01-20 LAB — VITAMIN D 25 HYDROXY (VIT D DEFICIENCY, FRACTURES): VITD: 8.25 ng/mL — ABNORMAL LOW (ref 30.00–100.00)

## 2015-01-20 MED ORDER — VITAMIN D (ERGOCALCIFEROL) 1.25 MG (50000 UNIT) PO CAPS: ORAL_CAPSULE | ORAL | Status: DC

## 2015-01-20 NOTE — Progress Notes (Signed)
Pre visit review using our clinic review tool, if applicable. No additional management support is needed unless otherwise documented below in the visit note. 

## 2015-01-20 NOTE — Progress Notes (Signed)
Subjective:    Patient ID: Richard Blake, male    DOB: October 07, 1946, 69 y.o.   MRN: [id number]  HPI Pt was dx'ed with RA and sarcoidosis, both in 1996.  He was on steroids from 1996-2010.  he noted to have osteoporosis in 2013.  he has never been on medication for this.  he has never had bony fracture.  he has no history of any of the following: multiple myeloma, renal dz, thyroid problems, prolonged bedrest, alcoholism, smoking, vid-d deficiency, primary hyperparathyroidism, heparin, or anticonvulsants.  He does have h/o urolithiasis.   He has many years of moderate pain at the mid-back, and assoc numbness of the feet.    He took an unknown oral med for osteoporosis in 2014, but stopped after a few months, due to concern about long-term side-effects.  He took only 1 injection of prolia, in 2014.  This was stopped, due to injection site pain.   Past Medical History  Diagnosis Date  . Chronic rhinitis   . Cough   . Interstitial lung disease   . Sarcoidosis 8/99    Transbronchial biopsy, taking daily prednisone since  November 08 on and off since 1999 w/cough every time he relapses off it for more than several weeks  . 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 HTN (hypertension)   . Osteoarthritis   . Anemia     Microcytic with Fe Sat 4% 10/31/10  . Atypical chest pain   . Hiatal hernia     Past Surgical History  Procedure Laterality Date  . Cervical fusion      History   Social History  . Marital Status: Single    Spouse Name: N/A  . Number of Children: N/A  . Years of Education: N/A   Occupational History  . Retired [employer]  . Smoking status: Never Smoker   . Smokeless tobacco: Never Used  . Alcohol Use: No  . Drug Use: No  . Sexual Activity: Not Currently   Other Topics Concern  . Not on file   Social History Narrative   Regular Exercise -  NO    Current Outpatient Prescriptions on  File Prior to Visit  Medication Sig Dispense Refill  . acetaminophen (TYLENOL) 500 MG tablet Take 500 mg by mouth daily.      Marland Kitchen amLODipine (NORVASC) 10 MG tablet Take 1 tablet (10 mg total) by mouth daily. 90 tablet 3  . famotidine (PEPCID) 20 MG tablet Take 20 mg by mouth at bedtime.      Marland Kitchen losartan (COZAAR) 100 MG tablet Take 1 tablet (100 mg total) by mouth daily. 90 tablet 3  . rosuvastatin (CRESTOR) 10 MG tablet Take 1 tablet (10 mg total) by mouth daily. 30 tablet 11  . vitamin B-12 (CYANOCOBALAMIN) 250 MCG tablet Take 250 mcg by mouth daily.      . [DISCONTINUED] Ferrous Sulfate (IRON) 325 (65 FE) MG TABS Take by mouth 3 (three) times daily.      . [DISCONTINUED] omeprazole (PRILOSEC) 20 MG capsule Take 20 mg by mouth daily.       No current facility-administered medications on file prior to visit.    Allergies  Allergen Reactions  . Welchol [Colesevelam Hcl]     Stomach cramps  . Prolia [Denosumab]     Pain at injection sight    Family History  Problem Relation Age of Onset  . Asthma Mother   .  Sarcoidosis Neg Hx   . Arthritis Other   . Diabetes Other   . Hypertension Other   . Prostate cancer Other   . Other Neg Hx     osteoporosis    BP 134/78 mmHg  Pulse 77  Temp(Src) 98.1 F (36.7 C) (Oral)  Ht '5\' 8"'  (1.727 m)  Wt 182 lb 2 oz (82.611 kg)  BMI 27.70 kg/m2  SpO2 98%     Review of Systems  Constitutional: Negative for unexpected weight change.  Eyes: Negative for visual disturbance.  Respiratory: Negative for shortness of breath.   Cardiovascular: Positive for leg swelling.  Gastrointestinal:       Mild heartburn  Endocrine: Negative for polyuria.  Genitourinary: Negative for difficulty urinating.  Skin: Negative for rash.  Neurological: Negative for syncope.   denies falls, memory loss, easy bruising, and rhinorrhea.  He reports leg cramps.    Objective:   Physical Exam VS: see vs page GEN: no distress HEAD: head: no deformity eyes: no  periorbital swelling, no proptosis external nose and ears are normal mouth: no lesion seen NECK: supple, thyroid is not enlarged CHEST WALL: no kyphosis BREASTS:  No gynecomastia.  MUSCULOSKELETAL: muscle bulk and strength are grossly normal.  no obvious joint swelling.  gait is normal and steady EXTEMITIES: no deformity.  no edema NEURO:  cn 2-12 grossly intact.   readily moves all 4's.  sensation is intact to touch on all 4's SKIN:  Normal texture and temperature.  No rash or suspicious lesion is visible.   PSYCH: alert, well-oriented.  Does not appear anxious nor depressed.    Lab Results  Component Value Date   TSH 1.19 01/12/2015   Lab Results  Component Value Date   CREATININE 1.42 01/12/2015   BUN 18 01/12/2015   NA 142 01/12/2015   K 4.1 01/12/2015   CL 106 01/12/2015   CO2 28 01/12/2015   Lab Results  Component Value Date   CALCIUM 9.3 01/12/2015   Lab Results  Component Value Date   PTH 125* 01/20/2015   CALCIUM 9.2 01/20/2015    Radiol studies are reviewed: DEXA (2013): FRAX=2.3/7.5    Assessment & Plan:  Vitamin-D deficiency, severe.   Elevated PSA.  This precludes rx of hypogonadism, so there is no need to check.   Osteoporosis, new to me: mild in the past, but he is due for recheck.    Patient is advised the following: Patient Instructions  blood tests are being requested for you today.  We'll let you know about the results. Let's recheck the bone-density test.   Based on the results, we would do an infusion called "reclast."  Please return in 1 year.  addendum: vitamin-D is really low. i have sent a prescription to your pharmacy, for this. Please recheck this blood test in 1 month. Please delay the bone-density test until after this blood test.

## 2015-01-20 NOTE — Patient Instructions (Addendum)
blood tests are being requested for you today.  We'll let you know about the results. Let's recheck the bone-density test.   Based on the results, we would do an infusion called "reclast."  Please return in 1 year.

## 2015-01-21 LAB — PTH, INTACT AND CALCIUM
Calcium: 9.2 mg/dL (ref 8.4–10.5)
PTH: 125 pg/mL — ABNORMAL HIGH (ref 14–64)

## 2015-01-28 ENCOUNTER — Other Ambulatory Visit: Payer: Self-pay | Admitting: Internal Medicine

## 2015-01-28 DIAGNOSIS — K449 Diaphragmatic hernia without obstruction or gangrene: Secondary | ICD-10-CM

## 2015-02-02 ENCOUNTER — Encounter: Payer: Self-pay | Admitting: Gastroenterology

## 2015-03-19 ENCOUNTER — Other Ambulatory Visit (INDEPENDENT_AMBULATORY_CARE_PROVIDER_SITE_OTHER): Payer: Medicare Other

## 2015-03-19 ENCOUNTER — Telehealth: Payer: Self-pay

## 2015-03-19 DIAGNOSIS — E559 Vitamin D deficiency, unspecified: Secondary | ICD-10-CM

## 2015-03-19 LAB — VITAMIN D 25 HYDROXY (VIT D DEFICIENCY, FRACTURES): VITD: 59.17 ng/mL (ref 30.00–100.00)

## 2015-03-19 NOTE — Telephone Encounter (Signed)
Patient is coming in today for labs at 10:15. Please advise what orders need to be placed.  Thanks!

## 2015-03-19 NOTE — Telephone Encounter (Signed)
Pt is coming in today for labs at 10:15 am. Please advise which orders need to be placed. Thanks!

## 2015-03-19 NOTE — Telephone Encounter (Signed)
i ordered

## 2015-03-20 ENCOUNTER — Other Ambulatory Visit: Payer: Self-pay | Admitting: Endocrinology

## 2015-03-20 DIAGNOSIS — M810 Age-related osteoporosis without current pathological fracture: Secondary | ICD-10-CM

## 2015-03-30 ENCOUNTER — Encounter: Payer: Self-pay | Admitting: Gastroenterology

## 2015-03-30 ENCOUNTER — Ambulatory Visit (INDEPENDENT_AMBULATORY_CARE_PROVIDER_SITE_OTHER): Payer: Medicare Other | Admitting: Gastroenterology

## 2015-03-30 VITALS — BP 112/72 | HR 72 | Ht 65.0 in | Wt 179.4 lb

## 2015-03-30 DIAGNOSIS — K449 Diaphragmatic hernia without obstruction or gangrene: Secondary | ICD-10-CM

## 2015-03-30 NOTE — Patient Instructions (Signed)
Your hiatal hernia is not causing any symptoms except very mild GERD. Continue taking pepcid as needed. Please call Dr. Ardis Hughs if you have discomforts after eating.

## 2015-03-30 NOTE — Progress Notes (Signed)
HPI: This is a  very pleasant    who was referred to me by Janith Lima, MD  to evaluate  hiatal hernia .    Chief complaint is I have a hiatal hernia  Has been anemic for a long time.    Was sent by Dr. Ronnald Ramp for hiatal hernia evaluation.  Large hiatal hernia known for a long time, at least 8-10 years  Takes pepcid, will take this aabout 2-3 times per week.  No nausea, no post prandial discomforts.   Overall his weight has been stable.  I reviewed his recent chest x-ray which described a large hiatal hernia. I reviewed these images personally as well.  Review of systems: Pertinent positive and negative review of systems were noted in the above HPI section. Complete review of systems was performed and was otherwise normal.   Past Medical History  Diagnosis Date  . Chronic rhinitis   . Cough   . Interstitial lung disease   . Sarcoidosis 8/99    Transbronchial biopsy, taking daily prednisone since  November 08 on and off since 1999 w/cough every time he relapses off it for more than several weeks  . 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 HTN (hypertension)   . Osteoarthritis   . Anemia     Microcytic with Fe Sat 4% 10/31/10  . Atypical chest pain   . Hiatal hernia   . Diverticulosis   . GERD (gastroesophageal reflux disease)   . HLD (hyperlipidemia)   . Kidney stones     Past Surgical History  Procedure Laterality Date  . Cervical fusion    . Spinal cord biopsyother]  1996    sarcoid  . Lipoma excision Left 09/16/2004    forearm  . Cataract extraction Left 10/14/2013  . Cataract extraction Right 11/24/2013    Current Outpatient Prescriptions  Medication Sig Dispense Refill  . acetaminophen (TYLENOL) 500 MG tablet Take 500 mg by mouth daily.      Marland Kitchen amLODipine (NORVASC) 10 MG tablet Take 1 tablet (10 mg total) by mouth daily. 90 tablet 3  . famotidine (PEPCID) 20 MG tablet Take 20 mg by mouth at bedtime.      Marland Kitchen losartan  (COZAAR) 100 MG tablet Take 1 tablet (100 mg total) by mouth daily. 90 tablet 3  . rosuvastatin (CRESTOR) 10 MG tablet Take 1 tablet (10 mg total) by mouth daily. 30 tablet 11  . vitamin B-12 (CYANOCOBALAMIN) 250 MCG tablet Take 250 mcg by mouth daily.      . [DISCONTINUED] Ferrous Sulfate (IRON) 325 (65 FE) MG TABS Take by mouth 3 (three) times daily.      . [DISCONTINUED] omeprazole (PRILOSEC) 20 MG capsule Take 20 mg by mouth daily.       No current facility-administered medications for this visit.    Allergies as of 03/30/2015 - Review Complete 03/30/2015  Allergen Reaction Noted  . Welchol [colesevelam hcl]  08/28/2013  . Prolia [denosumab]  01/22/2013    Family History  Problem Relation Age of Onset  . Asthma Mother   . Sarcoidosis Neg Hx   . Arthritis Paternal Grandmother   . Diabetes Mother   . Hypertension Father     Fathers family most  . Prostate cancer Father   . Other Neg Hx     osteoporosis  . Colon polyps Sister   . Multiple myeloma Sister   . Heart disease Paternal Grandmother   . Heart  disease Paternal Uncle   . Kidney disease Sister   . Kidney disease Father     History   Social History  . Marital Status: Single    Spouse Name: N/A  . Number of Children: 0  . Years of Education: N/A   Occupational History  . Retired [employer]  . Smoking status: Never Smoker   . Smokeless tobacco: Never Used  . Alcohol Use: No  . Drug Use: No  . Sexual Activity: Not Currently   Other Topics Concern  . Not on file   Social History Narrative   Regular Exercise -  NO     Physical Exam: BP 112/72 mmHg  Pulse 72  Ht _0  (1.651 m)  Wt 179 lb 6 oz (81.364 kg)  BMI 29.85 kg/m2 Constitutional: generally well-appearing Psychiatric: alert and oriented x3 Eyes: extraocular movements intact Mouth: oral pharynx moist, no lesions Neck: supple no lymphadenopathy Cardiovascular: heart regular rate and rhythm Lungs: clear to  auscultation bilaterally Abdomen: soft, nontender, nondistended, no obvious ascites, no peritoneal signs, normal bowel sounds Extremities: no lower extremity edema bilaterally Skin: no lesions on visible extremities   Assessment and plan: 69 y.o. male with  incidental hiatal hernia  I really cannot detect any clear symptoms related to this hiatal hernia except possibly very minor GERD which is well controlled on when necessary H2 blocker only. He has no postprandial symptoms. No nausea. I don't think the hiatal hernia needs any dedicated follow-up. I did explain that if he it may get larger and may start to cause symptoms such as postprandial bloating, fullness, discomforts, worsening of his GERD. If he has any of those symptoms he will note contact me. I explained to him that I would first try to manage it medically, likely with increased proton pump inhibitors. He does understand that sometimes hiatal hernias can be large and symptomatic and require surgery to fix.   Owens Loffler, MD [facility] 03/30/2015, 1:46 PM  Cc: Janith Lima, MD

## 2015-11-02 ENCOUNTER — Other Ambulatory Visit (INDEPENDENT_AMBULATORY_CARE_PROVIDER_SITE_OTHER): Payer: Medicare Other

## 2015-11-02 ENCOUNTER — Encounter: Payer: Self-pay | Admitting: Internal Medicine

## 2015-11-02 ENCOUNTER — Ambulatory Visit (INDEPENDENT_AMBULATORY_CARE_PROVIDER_SITE_OTHER)
Admission: RE | Admit: 2015-11-02 | Discharge: 2015-11-02 | Disposition: A | Discharge status: Home / Self Care | Payer: Medicare Other | Source: Ambulatory Visit | Attending: Internal Medicine | Admitting: Internal Medicine

## 2015-11-02 ENCOUNTER — Ambulatory Visit (INDEPENDENT_AMBULATORY_CARE_PROVIDER_SITE_OTHER): Payer: Medicare Other | Admitting: Internal Medicine

## 2015-11-02 VITALS — BP 130/80 | HR 60 | Temp 97.7°F | Resp 16 | Ht 65.0 in | Wt 186.0 lb

## 2015-11-02 DIAGNOSIS — E785 Hyperlipidemia, unspecified: Secondary | ICD-10-CM | POA: Diagnosis not present

## 2015-11-02 DIAGNOSIS — N400 Enlarged prostate without lower urinary tract symptoms: Secondary | ICD-10-CM

## 2015-11-02 DIAGNOSIS — E782 Mixed hyperlipidemia: Secondary | ICD-10-CM

## 2015-11-02 DIAGNOSIS — S52501A Unspecified fracture of the lower end of right radius, initial encounter for closed fracture: Secondary | ICD-10-CM

## 2015-11-02 DIAGNOSIS — S6992XA Unspecified injury of left wrist, hand and finger(s), initial encounter: Secondary | ICD-10-CM | POA: Insufficient documentation

## 2015-11-02 DIAGNOSIS — R972 Elevated prostate specific antigen [PSA]: Secondary | ICD-10-CM

## 2015-11-02 DIAGNOSIS — D234 Other benign neoplasm of skin of scalp and neck: Secondary | ICD-10-CM

## 2015-11-02 DIAGNOSIS — Z23 Encounter for immunization: Secondary | ICD-10-CM | POA: Diagnosis not present

## 2015-11-02 DIAGNOSIS — S52515A Nondisplaced fracture of left radial styloid process, initial encounter for closed fracture: Secondary | ICD-10-CM | POA: Diagnosis not present

## 2015-11-02 DIAGNOSIS — D224 Melanocytic nevi of scalp and neck: Secondary | ICD-10-CM

## 2015-11-02 DIAGNOSIS — I10 Essential (primary) hypertension: Secondary | ICD-10-CM | POA: Diagnosis not present

## 2015-11-02 DIAGNOSIS — S52509A Unspecified fracture of the lower end of unspecified radius, initial encounter for closed fracture: Secondary | ICD-10-CM | POA: Insufficient documentation

## 2015-11-02 LAB — URINALYSIS, ROUTINE W REFLEX MICROSCOPIC
Bilirubin Urine: NEGATIVE
HGB URINE DIPSTICK: NEGATIVE
Ketones, ur: NEGATIVE
Leukocytes, UA: NEGATIVE
NITRITE: NEGATIVE
RBC / HPF: NONE SEEN (ref 0–?)
SPECIFIC GRAVITY, URINE: 1.02 (ref 1.000–1.030)
Total Protein, Urine: NEGATIVE
URINE GLUCOSE: NEGATIVE
Urobilinogen, UA: 0.2 (ref 0.0–1.0)
pH: 6 (ref 5.0–8.0)

## 2015-11-02 LAB — COMPREHENSIVE METABOLIC PANEL WITH GFR
ALT: 20 U/L (ref 0–53)
AST: 23 U/L (ref 0–37)
Albumin: 4.3 g/dL (ref 3.5–5.2)
Alkaline Phosphatase: 104 U/L (ref 39–117)
BUN: 18 mg/dL (ref 6–23)
CO2: 27 meq/L (ref 19–32)
Calcium: 10 mg/dL (ref 8.4–10.5)
Chloride: 104 meq/L (ref 96–112)
Creatinine, Ser: 1.36 mg/dL (ref 0.40–1.50)
GFR: 66.99 mL/min (ref >60.00)
Glucose, Bld: 94 mg/dL (ref 70–99)
Potassium: 4.9 meq/L (ref 3.5–5.1)
Sodium: 140 meq/L (ref 135–145)
Total Bilirubin: 0.6 mg/dL (ref 0.2–1.2)
Total Protein: 7.5 g/dL (ref 6.0–8.3)

## 2015-11-02 LAB — LIPID PANEL
CHOL/HDL RATIO: 4
Cholesterol: 223 mg/dL — ABNORMAL HIGH (ref 0–200)
HDL: 62.9 mg/dL (ref >39.00)
LDL Cholesterol: 142 mg/dL — ABNORMAL HIGH (ref 0–99)
NONHDL: 160.49
Triglycerides: 94 mg/dL (ref 0.0–149.0)
VLDL: 18.8 mg/dL (ref 0.0–40.0)

## 2015-11-02 LAB — TSH: TSH: 0.72 u[IU]/mL (ref 0.35–4.50)

## 2015-11-02 LAB — PSA: PSA: 4.34 ng/mL — AB (ref 0.10–4.00)

## 2015-11-02 MED ORDER — ROSUVASTATIN CALCIUM 10 MG PO TABS: 10.0000 mg | ORAL_TABLET | Freq: Every day | ORAL | Status: DC

## 2015-11-02 MED ORDER — LOSARTAN POTASSIUM 100 MG PO TABS
100.0000 mg | ORAL_TABLET | Freq: Every day | ORAL | Status: DC
Start: 2015-11-02 — End: 2017-02-08

## 2015-11-02 MED ORDER — AMLODIPINE BESYLATE 10 MG PO TABS: 10.0000 mg | ORAL_TABLET | Freq: Every day | ORAL | Status: DC

## 2015-11-02 NOTE — Patient Instructions (Signed)
Wrist Pain There are many things that can cause wrist pain. Some common causes include:  An injury to the wrist area, such as a sprain, strain, or fracture.  Overuse of the joint.  A condition that causes increased pressure on a nerve in the wrist (carpal tunnel syndrome).  Wear and tear of the joints that occurs with aging (osteoarthritis).  A variety of other types of arthritis. Sometimes, the cause of wrist pain is not known. The pain often goes away when you follow your health care provider's instructions for relieving pain at home. If your wrist pain continues, tests may need to be done to diagnose your condition. HOME CARE INSTRUCTIONS Pay attention to any changes in your symptoms. Take these actions to help with your pain:  Rest the wrist area for at least 48 hours or as told by your health care provider.  If directed, apply ice to the injured area:  Put ice in a plastic bag.  Place a towel between your skin and the bag.  Leave the ice on for 20 minutes, 2-3 times per day.  Keep your arm raised (elevated) above the level of your heart while you are sitting or lying down.  If a splint or elastic bandage has been applied, use it as told by your health care provider.  Remove the splint or bandage only as told by your health care provider.  Loosen the splint or bandage if your fingers become numb or have a tingling feeling, or if they turn cold or blue.  Take over-the-counter and prescription medicines only as told by your health care provider.  Keep all follow-up visits as told by your health care provider. This is important. SEEK MEDICAL CARE IF:  Your pain is not helped by treatment.  Your pain gets worse. SEEK IMMEDIATE MEDICAL CARE IF:  Your fingers become swollen.  Your fingers turn white, very red, or cold and blue.  Your fingers are numb or have a tingling feeling.  You have difficulty moving your fingers.   This information is not intended to replace  advice given to you by your health care provider. Make sure you discuss any questions you have with your health care provider.   Document Released: 08/09/2005 Document Revised: 07/21/2015 Document Reviewed: 03/17/2015 Elsevier Interactive Patient Education 2016 [employer]vier Inc.  

## 2015-11-02 NOTE — Progress Notes (Signed)
Pre visit review using our clinic review tool, if applicable. No additional management support is needed unless otherwise documented below in the visit note. 

## 2015-11-02 NOTE — Progress Notes (Signed)
Subjective:  Patient ID: Richard Blake, male    DOB: Jan 20, 1946  Age: 69 y.o. MRN: [id number]  CC: Wrist Injury; Hypertension; and Hyperlipidemia   HPI Richard Blake presents for follow-up. He complains that 10 days ago he fell and landed on an outstretched left upper extremity and has persistent pain and swelling in his left wrist. He also complains that there is a mole on his scalp that is changing. He is due for recheck on his cholesterol level and tells me that he ran out of Crestor several months ago.  Outpatient Prescriptions Prior to Visit  Medication Sig Dispense Refill  . acetaminophen (TYLENOL) 500 MG tablet Take 500 mg by mouth daily.      . famotidine (PEPCID) 20 MG tablet Take 20 mg by mouth at bedtime.      . vitamin B-12 (CYANOCOBALAMIN) 250 MCG tablet Take 250 mcg by mouth daily.      Marland Kitchen amLODipine (NORVASC) 10 MG tablet Take 1 tablet (10 mg total) by mouth daily. 90 tablet 3  . losartan (COZAAR) 100 MG tablet Take 1 tablet (100 mg total) by mouth daily. 90 tablet 3  . rosuvastatin (CRESTOR) 10 MG tablet Take 1 tablet (10 mg total) by mouth daily. 30 tablet 11   No facility-administered medications prior to visit.    ROS Review of Systems  Constitutional: Negative.  Negative for fever, chills, diaphoresis, appetite change and fatigue.  HENT: Negative.  Negative for ear discharge, sinus pressure, trouble swallowing and voice change.   Eyes: Negative.   Respiratory: Negative.  Negative for cough, choking, chest tightness, shortness of breath and stridor.   Cardiovascular: Negative.  Negative for chest pain, palpitations and leg swelling.  Gastrointestinal: Negative.  Negative for nausea, vomiting, abdominal pain, diarrhea and constipation.  Endocrine: Negative.   Genitourinary: Negative.   Musculoskeletal: Positive for arthralgias. Negative for myalgias, back pain and neck pain.  Skin: Negative.   Allergic/Immunologic: Negative.   Neurological: Negative.     Hematological: Negative.   Psychiatric/Behavioral: Negative.     Objective:  BP 130/80 mmHg  Pulse 60  Temp(Src) 97.7 F (36.5 C) (Oral)  Resp 16  Ht 5\' 5"  (1.651 m)  Wt 186 lb (84.369 kg)  BMI 30.95 kg/m2  SpO2 98%  BP Readings from Last 3 Encounters:  11/02/15 130/80  03/30/15 112/72  01/20/15 134/78    Wt Readings from Last 3 Encounters:  11/02/15 186 lb (84.369 kg)  03/30/15 179 lb 6 oz (81.364 kg)  01/20/15 182 lb 2 oz (82.611 kg)    Physical Exam  Constitutional: He is oriented to person, place, and time. No distress.  HENT:  Mouth/Throat: Oropharynx is clear and moist. No oropharyngeal exudate.  Eyes: Conjunctivae are normal. Right eye exhibits no discharge. Left eye exhibits no discharge. No scleral icterus.  Neck: Normal range of motion. Neck supple. No JVD present. No tracheal deviation present. No thyromegaly present.  Cardiovascular: Normal rate, regular rhythm, normal heart sounds and intact distal pulses.  Exam reveals no gallop and no friction rub.   No murmur heard. Pulmonary/Chest: Effort normal and breath sounds normal. No stridor. No respiratory distress. He has no wheezes. He has no rales. He exhibits no tenderness.  Abdominal: Soft. Bowel sounds are normal. He exhibits no distension and no mass. There is no tenderness. There is no rebound and no guarding.  Musculoskeletal: Normal range of motion. He exhibits no edema or tenderness.       Left wrist: He exhibits  swelling. He exhibits normal range of motion, no bony tenderness, no effusion, no crepitus, no deformity and no laceration.  Lymphadenopathy:    He has no cervical adenopathy.  Neurological: He is oriented to person, place, and time.  Skin: Skin is warm and dry. No rash noted. He is not diaphoretic. No erythema. No pallor.  On the right superior, posterior aspect of the parietal scalp there is a pigmented lesion. It has various degrees of pigmentation with some brown and some black. The borders  are asymmetrical.  Vitals reviewed.   Lab Results  Component Value Date   WBC 4.9 01/12/2015   HGB 13.1 01/12/2015   HCT 39.9 01/12/2015   PLT 202.0 01/12/2015   GLUCOSE 94 11/02/2015   CHOL 223* 11/02/2015   TRIG 94.0 11/02/2015   HDL 62.90 11/02/2015   LDLDIRECT 186.5 10/23/2012   LDLCALC 142* 11/02/2015   ALT 20 11/02/2015   AST 23 11/02/2015   NA 140 11/02/2015   K 4.9 11/02/2015   CL 104 11/02/2015   CREATININE 1.36 11/02/2015   BUN 18 11/02/2015   CO2 27 11/02/2015   TSH 0.72 11/02/2015   PSA 4.34* 11/02/2015    Dg Bone Density  01/21/2015  Date of study: 01/20/2015 Exam: DUAL X-RAY ABSORPTIOMETRY (DXA) FOR BONE MINERAL DENSITY (BMD) Instrument: [place] Requesting Provider: Dr. Loanne Drilling Indication: h/o osteoporosis Comparison: none (please note that it is not possible to compare data from different instruments) Clinical data: Pt is a 69 y.o. male without h/o fractures. Results:  Lumbar spine (L1-L3) Femoral neck (FN) T-score - 1.6 RFN: - 2.5 LFN: - 1.9 Assessment: Patient has OSTEOPOROSIS according to the [facility] classification for osteoporosis (see below). Fracture risk: high Comments: the technical quality of the study is good Evaluation for secondary causes should be considered if clinically indicated. Recommend optimizing calcium (1200 mg/day) and vitamin D (800 IU/day). Followup: Repeat BMD is appropriate after 2 years or after 1-2 years if starting treatment. WHO criteria for diagnosis of osteoporosis in postmenopausal women and in men 89 y/o or older: - normal: T-score -1.0 to + 1.0 - osteopenia/low bone density: T-score between -2.5 and -1.0 - osteoporosis: T-score below -2.5 - severe osteoporosis: T-score below -2.5 with history of fragility fracture Note: although not part of the WHO classification, the presence of a fragility fracture, regardless of the T-score, should be considered diagnostic of osteoporosis, provided other causes for the fracture have been  excluded. Treatment: The National Osteoporosis Foundation recommends that treatment be considered in postmenopausal women and men age 54 or older with: 1. Hip or vertebral (clinical or morphometric) fracture 2. T-score of - 2.5 or lower at the spine or hip 3. 10-year fracture probability by FRAX of at least 20% for a major osteoporotic fracture and 3% for a hip fracture Philemon Kingdom, MD [facility]    Assessment & Plan:   Richard Blake was seen today for wrist injury, hypertension and hyperlipidemia.  Diagnoses and all orders for this visit:  Essential hypertension- his blood pressure is well-controlled, electrolytes and renal function are stable. -     amLODipine (NORVASC) 10 MG tablet; Take 1 tablet (10 mg total) by mouth daily. -     losartan (COZAAR) 100 MG tablet; Take 1 tablet (100 mg total) by mouth daily. -     Comprehensive metabolic panel; Future  Mixed hyperlipidemia -     rosuvastatin (CRESTOR) 10 MG tablet; Take 1 tablet (10 mg total) by mouth daily. -  Comprehensive metabolic panel; Future -     Lipid panel; Future  Encounter for immunization  BPH (benign prostatic hyperplasia)- he has no symptoms that need to be treated. -     Urinalysis, Routine w reflex microscopic (not at [facility]); Future -     PSA; Future  Hyperlipidemia with target LDL less than 130- his LDL is too high, will restart Crestor -     Comprehensive metabolic panel; Future -     Lipid panel; Future -     TSH; Future  PSA elevation- his PSA has been slightly elevated for about 4 years. I am not suspicious that he has prostate cancer. -     Urinalysis, Routine w reflex microscopic (not at [facility]); Future -     PSA; Future  Left wrist injury, initial encounter- x-ray shows a nondisplaced fracture, will refer to orthopedics -     DG Wrist Complete Left; Future -     Ambulatory referral to Orthopedic Surgery  Atypical nevus of scalp -     Ambulatory referral to Dermatology  Distal radius  fracture, right, closed, initial encounter -     Ambulatory referral to Orthopedic Surgery  Other orders -     Flu Vaccine QUAD 36+ mos IM   I am having Mr. Merfeld maintain his vitamin B-12, famotidine, acetaminophen, amLODipine, losartan, and rosuvastatin.  Meds ordered this encounter  Medications  . amLODipine (NORVASC) 10 MG tablet    Sig: Take 1 tablet (10 mg total) by mouth daily.    Dispense:  90 tablet    Refill:  3  . losartan (COZAAR) 100 MG tablet    Sig: Take 1 tablet (100 mg total) by mouth daily.    Dispense:  90 tablet    Refill:  3  . rosuvastatin (CRESTOR) 10 MG tablet    Sig: Take 1 tablet (10 mg total) by mouth daily.    Dispense:  90 tablet    Refill:  3     Follow-up: Return in about 3 weeks (around 11/23/2015).  Scarlette Calico, MD

## 2015-11-03 DIAGNOSIS — S52502A Unspecified fracture of the lower end of left radius, initial encounter for closed fracture: Secondary | ICD-10-CM | POA: Diagnosis not present

## 2015-11-18 DIAGNOSIS — H52221 Regular astigmatism, right eye: Secondary | ICD-10-CM | POA: Diagnosis not present

## 2015-11-18 DIAGNOSIS — H524 Presbyopia: Secondary | ICD-10-CM | POA: Diagnosis not present

## 2015-11-18 DIAGNOSIS — H5213 Myopia, bilateral: Secondary | ICD-10-CM | POA: Diagnosis not present

## 2015-11-18 DIAGNOSIS — H26053 Posterior subcapsular polar infantile and juvenile cataract, bilateral: Secondary | ICD-10-CM | POA: Diagnosis not present

## 2015-12-01 DIAGNOSIS — S52502D Unspecified fracture of the lower end of left radius, subsequent encounter for closed fracture with routine healing: Secondary | ICD-10-CM | POA: Diagnosis not present

## 2015-12-07 DIAGNOSIS — L820 Inflamed seborrheic keratosis: Secondary | ICD-10-CM | POA: Diagnosis not present

## 2015-12-07 DIAGNOSIS — L821 Other seborrheic keratosis: Secondary | ICD-10-CM | POA: Diagnosis not present

## 2016-01-19 ENCOUNTER — Ambulatory Visit (INDEPENDENT_AMBULATORY_CARE_PROVIDER_SITE_OTHER): Payer: Medicare Other | Admitting: Internal Medicine

## 2016-01-19 ENCOUNTER — Encounter: Payer: Self-pay | Admitting: Internal Medicine

## 2016-01-19 ENCOUNTER — Other Ambulatory Visit (INDEPENDENT_AMBULATORY_CARE_PROVIDER_SITE_OTHER): Payer: Medicare Other

## 2016-01-19 VITALS — BP 130/86 | HR 76 | Temp 98.3°F | Resp 16 | Ht 65.0 in | Wt 185.0 lb

## 2016-01-19 DIAGNOSIS — E785 Hyperlipidemia, unspecified: Secondary | ICD-10-CM

## 2016-01-19 DIAGNOSIS — Z23 Encounter for immunization: Secondary | ICD-10-CM | POA: Diagnosis not present

## 2016-01-19 DIAGNOSIS — M810 Age-related osteoporosis without current pathological fracture: Secondary | ICD-10-CM

## 2016-01-19 DIAGNOSIS — R972 Elevated prostate specific antigen [PSA]: Secondary | ICD-10-CM | POA: Diagnosis not present

## 2016-01-19 DIAGNOSIS — E559 Vitamin D deficiency, unspecified: Secondary | ICD-10-CM

## 2016-01-19 DIAGNOSIS — I10 Essential (primary) hypertension: Secondary | ICD-10-CM | POA: Diagnosis not present

## 2016-01-19 DIAGNOSIS — Z1211 Encounter for screening for malignant neoplasm of colon: Secondary | ICD-10-CM

## 2016-01-19 DIAGNOSIS — N400 Enlarged prostate without lower urinary tract symptoms: Secondary | ICD-10-CM | POA: Diagnosis not present

## 2016-01-19 LAB — LIPID PANEL
CHOL/HDL RATIO: 3
Cholesterol: 169 mg/dL (ref 0–200)
HDL: 66.7 mg/dL (ref >39.00)
LDL Cholesterol: 86 mg/dL (ref 0–99)
NonHDL: 102.03
Triglycerides: 79 mg/dL (ref 0.0–149.0)
VLDL: 15.8 mg/dL (ref 0.0–40.0)

## 2016-01-19 LAB — BASIC METABOLIC PANEL
BUN: 13 mg/dL (ref 6–23)
CHLORIDE: 105 meq/L (ref 96–112)
CO2: 28 mEq/L (ref 19–32)
Calcium: 9.6 mg/dL (ref 8.4–10.5)
Creatinine, Ser: 1.22 mg/dL (ref 0.40–1.50)
GFR: 75.89 mL/min (ref >60.00)
GLUCOSE: 83 mg/dL (ref 70–99)
POTASSIUM: 4.2 meq/L (ref 3.5–5.1)
SODIUM: 140 meq/L (ref 135–145)

## 2016-01-19 MED ORDER — FOSTEUM PLUS PO CAPS: 1.0000 | ORAL_CAPSULE | Freq: Two times a day (BID) | ORAL | Status: DC

## 2016-01-19 NOTE — Patient Instructions (Signed)
Hypertension Hypertension, commonly called high blood pressure, is when the force of blood pumping through your arteries is too strong. Your arteries are the blood vessels that carry blood from your heart throughout your body. A blood pressure reading consists of a higher number over a lower number, such as 110/72. The higher number (systolic) is the pressure inside your arteries when your heart pumps. The lower number (diastolic) is the pressure inside your arteries when your heart relaxes. Ideally you want your blood pressure below 120/80. Hypertension forces your heart to work harder to pump blood. Your arteries may become narrow or stiff. Having untreated or uncontrolled hypertension can cause heart attack, stroke, kidney disease, and other problems. RISK FACTORS Some risk factors for high blood pressure are controllable. Others are not.  Risk factors you cannot control include:   Race. You may be at higher risk if you are African American.  Age. Risk increases with age.  Gender. Men are at higher risk than women before age 45 years. After age 65, women are at higher risk than men. Risk factors you can control include:  Not getting enough exercise or physical activity.  Being overweight.  Getting too much fat, sugar, calories, or salt in your diet.  Drinking too much alcohol. SIGNS AND SYMPTOMS Hypertension does not usually cause signs or symptoms. Extremely high blood pressure (hypertensive crisis) may cause headache, anxiety, shortness of breath, and nosebleed. DIAGNOSIS To check if you have hypertension, your health care provider will measure your blood pressure while you are seated, with your arm held at the level of your heart. It should be measured at least twice using the same arm. Certain conditions can cause a difference in blood pressure between your right and left arms. A blood pressure reading that is higher than normal on one occasion does not mean that you need treatment. If  it is not clear whether you have high blood pressure, you may be asked to return on a different day to have your blood pressure checked again. Or, you may be asked to monitor your blood pressure at home for 1 or more weeks. TREATMENT Treating high blood pressure includes making lifestyle changes and possibly taking medicine. Living a healthy lifestyle can help lower high blood pressure. You may need to change some of your habits. Lifestyle changes may include:  Following the DASH diet. This diet is high in fruits, vegetables, and whole grains. It is low in salt, red meat, and added sugars.  Keep your sodium intake below 2,300 mg per day.  Getting at least 30-45 minutes of aerobic exercise at least 4 times per week.  Losing weight if necessary.  Not smoking.  Limiting alcoholic beverages.  Learning ways to reduce stress. Your health care provider may prescribe medicine if lifestyle changes are not enough to get your blood pressure under control, and if one of the following is true:  You are 18-59 years of age and your systolic blood pressure is above 140.  You are 60 years of age or older, and your systolic blood pressure is above 150.  Your diastolic blood pressure is above 90.  You have diabetes, and your systolic blood pressure is over 140 or your diastolic blood pressure is over 90.  You have kidney disease and your blood pressure is above 140/90.  You have heart disease and your blood pressure is above 140/90. Your personal target blood pressure may vary depending on your medical conditions, your age, and other factors. HOME CARE INSTRUCTIONS    Have your blood pressure rechecked as directed by your health care provider.   Take medicines only as directed by your health care provider. Follow the directions carefully. Blood pressure medicines must be taken as prescribed. The medicine does not work as well when you skip doses. Skipping doses also puts you at risk for  problems.  Do not smoke.   Monitor your blood pressure at home as directed by your health care provider. SEEK MEDICAL CARE IF:   You think you are having a reaction to medicines taken.  You have recurrent headaches or feel dizzy.  You have swelling in your ankles.  You have trouble with your vision. SEEK IMMEDIATE MEDICAL CARE IF:  You develop a severe headache or confusion.  You have unusual weakness, numbness, or feel faint.  You have severe chest or abdominal pain.  You vomit repeatedly.  You have trouble breathing. MAKE SURE YOU:   Understand these instructions.  Will watch your condition.  Will get help right away if you are not doing well or get worse.   This information is not intended to replace advice given to you by your health care provider. Make sure you discuss any questions you have with your health care provider.   Document Released: 10/30/2005 Document Revised: 03/16/2015 Document Reviewed: 08/22/2013 Elsevier Interactive Patient Education 2016 [employer] Inc.  

## 2016-01-19 NOTE — Progress Notes (Signed)
Pre visit review using our clinic review tool, if applicable. No additional management support is needed unless otherwise documented below in the visit note. 

## 2016-01-19 NOTE — Progress Notes (Signed)
Subjective:  Patient ID: Richard Blake, male    DOB: 07-May-1946  Age: 70 y.o. MRN: [id number]  CC: Hyperlipidemia; Hypertension; and Osteoporosis   HPI JUNUIS Blake presents for a blood pressure check as well as follow-up on hypercholesterolemia and osteoporosis. A few months ago he had a slightly elevated elevated PSA test. He is not ready to repeat that today and just wants to continue with watchful waiting without a referral to urology either. He thinks he is due for his annual injection of a medication to treat osteoporosis. He wants to know about other treatment options as well. He tells me that his blood pressures been well controlled on his current regimen. He denies headache, chest pain, shortness of breath, dizziness, or edema.  Outpatient Prescriptions Prior to Visit  Medication Sig Dispense Refill  . acetaminophen (TYLENOL) 500 MG tablet Take 500 mg by mouth daily.      Marland Kitchen amLODipine (NORVASC) 10 MG tablet Take 1 tablet (10 mg total) by mouth daily. 90 tablet 3  . famotidine (PEPCID) 20 MG tablet Take 20 mg by mouth at bedtime.      Marland Kitchen losartan (COZAAR) 100 MG tablet Take 1 tablet (100 mg total) by mouth daily. 90 tablet 3  . rosuvastatin (CRESTOR) 10 MG tablet Take 1 tablet (10 mg total) by mouth daily. 90 tablet 3  . vitamin B-12 (CYANOCOBALAMIN) 250 MCG tablet Take 250 mcg by mouth daily.       No facility-administered medications prior to visit.    ROS Review of Systems  Constitutional: Negative.  Negative for fever, chills, diaphoresis, appetite change and fatigue.  HENT: Negative.   Eyes: Negative.   Respiratory: Negative.  Negative for cough, choking, chest tightness, shortness of breath and stridor.   Cardiovascular: Negative.  Negative for chest pain, palpitations and leg swelling.  Gastrointestinal: Negative.  Negative for nausea, vomiting, abdominal pain, diarrhea, constipation and blood in stool.  Endocrine: Negative.   Genitourinary: Negative.  Negative  for dysuria, frequency and difficulty urinating.  Musculoskeletal: Negative.  Negative for myalgias, back pain, joint swelling and arthralgias.  Skin: Negative.  Negative for color change and rash.  Allergic/Immunologic: Negative.   Neurological: Negative.  Negative for dizziness, tremors, weakness, light-headedness, numbness and headaches.  Hematological: Negative.  Negative for adenopathy. Does not bruise/bleed easily.  Psychiatric/Behavioral: Negative.     Objective:  BP 130/86 mmHg  Pulse 76  Temp(Src) 98.3 F (36.8 C) (Oral)  Resp 16  Ht 5\' 5"  (1.651 m)  Wt 185 lb (83.915 kg)  BMI 30.79 kg/m2  SpO2 99%  BP Readings from Last 3 Encounters:  01/19/16 130/86  11/02/15 130/80  03/30/15 112/72    Wt Readings from Last 3 Encounters:  01/19/16 185 lb (83.915 kg)  11/02/15 186 lb (84.369 kg)  03/30/15 179 lb 6 oz (81.364 kg)    Physical Exam  Constitutional: He is oriented to person, place, and time. No distress.  HENT:  Mouth/Throat: Oropharynx is clear and moist. No oropharyngeal exudate.  Eyes: Conjunctivae are normal. Right eye exhibits no discharge. Left eye exhibits no discharge. No scleral icterus.  Neck: Normal range of motion. Neck supple. No JVD present. No tracheal deviation present. No thyromegaly present.  Cardiovascular: Normal rate, regular rhythm, normal heart sounds and intact distal pulses.  Exam reveals no gallop and no friction rub.   No murmur heard. Pulmonary/Chest: Effort normal and breath sounds normal. No stridor. No respiratory distress. He has no wheezes. He has no rales. He  exhibits no tenderness.  Abdominal: Soft. Bowel sounds are normal. He exhibits no distension and no mass. There is no tenderness. There is no rebound and no guarding.  Musculoskeletal: Normal range of motion. He exhibits no edema or tenderness.  Lymphadenopathy:    He has no cervical adenopathy.  Neurological: He is oriented to person, place, and time.  Skin: Skin is warm and  dry. No rash noted. He is not diaphoretic. No erythema. No pallor.  Vitals reviewed.   Lab Results  Component Value Date   WBC 4.9 01/12/2015   HGB 13.1 01/12/2015   HCT 39.9 01/12/2015   PLT 202.0 01/12/2015   GLUCOSE 83 01/19/2016   CHOL 169 01/19/2016   TRIG 79.0 01/19/2016   HDL 66.70 01/19/2016   LDLDIRECT 186.5 10/23/2012   LDLCALC 86 01/19/2016   ALT 20 11/02/2015   AST 23 11/02/2015   NA 140 01/19/2016   K 4.2 01/19/2016   CL 105 01/19/2016   CREATININE 1.22 01/19/2016   BUN 13 01/19/2016   CO2 28 01/19/2016   TSH 0.72 11/02/2015   PSA 4.34* 11/02/2015    Dg Wrist Complete Left  11/02/2015  CLINICAL DATA:  Tripped in bedroom, left wrist pain after fall EXAM: LEFT WRIST - COMPLETE 3+ VIEW COMPARISON:  None. FINDINGS: Mild first carpal metacarpal joint arthritis. Nondisplaced fracture involves the articulating surface of the radius. There are no other acute abnormalities. IMPRESSION: Nondisplaced distal radius fracture Electronically Signed   By: Skipper Cliche M.D.   On: 11/02/2015 15:00    Assessment & Plan:   Nysaiah was seen today for hyperlipidemia, hypertension and osteoporosis.  Diagnoses and all orders for this visit:  Osteoporosis- I have recommended that he follow with endocrinology due to receive his annual injection of week last, I have also asked him to start a dietary supplement to improve his bone strength -     Dietary Management Product (FOSTEUM PLUS) CAPS; Take 1 capsule by mouth 2 (two) times daily. -     Basic metabolic panel; Future -     Ambulatory referral to Endocrinology  BPH (benign prostatic hyperplasia)-he has no signs and symptoms related to this  PSA elevation- at his request I did not repeat his PSA level today.  Vitamin D deficiency  Essential hypertension- his blood pressure is well-controlled, electrolytes and renal function are stable. -     Basic metabolic panel; Future  Colon cancer screening -     Ambulatory referral to  Gastroenterology  Hyperlipidemia with target LDL less than 130- he is achieved his LDL goal is doing well on the statin. -     Lipid panel; Future  Need for 23-polyvalent pneumococcal polysaccharide vaccine -     Pneumococcal polysaccharide vaccine 23-valent greater than or equal to 2yo subcutaneous/IM  I am having Mr. Chrissie Noa start on [place]. I am also having him maintain his vitamin B-12, famotidine, acetaminophen, amLODipine, losartan, and rosuvastatin.  Meds ordered this encounter  Medications  . Dietary Management Product (FOSTEUM PLUS) CAPS    Sig: Take 1 capsule by mouth 2 (two) times daily.    Dispense:  60 capsule    Refill:  11     Follow-up: Return in about 6 months (around 07/21/2016).  Scarlette Calico, MD

## 2016-01-20 ENCOUNTER — Encounter: Payer: Self-pay | Admitting: Gastroenterology

## 2016-02-04 ENCOUNTER — Ambulatory Visit (INDEPENDENT_AMBULATORY_CARE_PROVIDER_SITE_OTHER): Payer: Medicare Other | Admitting: Endocrinology

## 2016-02-04 ENCOUNTER — Encounter: Payer: Self-pay | Admitting: Endocrinology

## 2016-02-04 ENCOUNTER — Telehealth: Payer: Self-pay | Admitting: Endocrinology

## 2016-02-04 VITALS — BP 132/84 | HR 72 | Temp 98.3°F | Ht 65.0 in | Wt 188.0 lb

## 2016-02-04 DIAGNOSIS — E559 Vitamin D deficiency, unspecified: Secondary | ICD-10-CM

## 2016-02-04 DIAGNOSIS — M810 Age-related osteoporosis without current pathological fracture: Secondary | ICD-10-CM

## 2016-02-04 LAB — VITAMIN D 25 HYDROXY (VIT D DEFICIENCY, FRACTURES): VITD: 23.99 ng/mL — AB (ref 30.00–100.00)

## 2016-02-04 NOTE — Telephone Encounter (Signed)
Linda: Here is another person who needs reclast scheduled, thanks.

## 2016-02-04 NOTE — Patient Instructions (Addendum)
blood tests are being requested for you today.  We'll let you know about the results.  let's do an infusion called "reclast."  you will receive a phone call, about a day and time for an appointment.  Then please do the bone density 1 month or so later.   Please continue the same pepcid.   Take calcium 1200 mg per day, and vitamin-D, 400 units per day.   it is critically important to prevent falling down (keep floor areas well-lit, dry, and free of loose objects.  If you have a cane, walker, or wheelchair, you should use it, even for short trips around the house.  Wear flat-soled shoes.  Also, try not to rush) Please return in 1 year.

## 2016-02-04 NOTE — Progress Notes (Signed)
Subjective:    Patient ID: Richard Blake, male    DOB: 1946/02/02, 69 y.o.   MRN: [id number]  HPI Pt returns for f/u of osteoporosis (she was dx'ed with RA and sarcoidosis, both in 1996; he was on steroids from 1996-2010; he was noted to have osteoporosis in 2013; he has never had bony fracture; he does have h/o urolithiasis; he took fosamax for a few months in 2014, but he stopped after a few months, due to concern about long-term side-effects; GERD precludes oral bis-phosphonate rx now; he took only 1 injection of prolia, in 2014; this was stopped, due to injection site pain; in 2016, he was rx'ed for severe vit-D deficiency; he has hypogonadism, but rx is precluded by elevated PSA). He did not receive the reclast infusion.  He has slight cramps in the legs, but no assoc numbness.  No recent steroids Past Medical History  Diagnosis Date  . Chronic rhinitis   . Cough   . Interstitial lung disease ([place])   . Sarcoidosis ([place]) 8/99    Transbronchial biopsy, taking daily prednisone since  November 08 on and off since 1999 w/cough every time he relapses off it for more than several weeks  . 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 HTN (hypertension)   . Osteoarthritis   . Anemia     Microcytic with Fe Sat 4% 10/31/10  . Atypical chest pain   . Hiatal hernia   . Diverticulosis   . GERD (gastroesophageal reflux disease)   . HLD (hyperlipidemia)   . Kidney stones     Past Surgical History  Procedure Laterality Date  . Cervical fusion    . Spinal cord biopsyother]  1996    sarcoid  . Lipoma excision Left 09/16/2004    forearm  . Cataract extraction Left 10/14/2013  . Cataract extraction Right 11/24/2013    Social History   Social History  . Marital Status: Single    Spouse Name: N/A  . Number of Children: 0  . Years of Education: N/A   Occupational History  . Retired [employer]  . Smoking status:  Never Smoker   . Smokeless tobacco: Never Used  . Alcohol Use: No  . Drug Use: No  . Sexual Activity: Not Currently   Other Topics Concern  . Not on file   Social History Narrative   Regular Exercise -  NO    Current Outpatient Prescriptions on File Prior to Visit  Medication Sig Dispense Refill  . acetaminophen (TYLENOL) 500 MG tablet Take 500 mg by mouth daily.      Marland Kitchen amLODipine (NORVASC) 10 MG tablet Take 1 tablet (10 mg total) by mouth daily. 90 tablet 3  . Dietary Management Product (FOSTEUM PLUS) CAPS Take 1 capsule by mouth 2 (two) times daily. 60 capsule 11  . famotidine (PEPCID) 20 MG tablet Take 20 mg by mouth at bedtime.      Marland Kitchen losartan (COZAAR) 100 MG tablet Take 1 tablet (100 mg total) by mouth daily. 90 tablet 3  . rosuvastatin (CRESTOR) 10 MG tablet Take 1 tablet (10 mg total) by mouth daily. 90 tablet 3  . vitamin B-12 (CYANOCOBALAMIN) 250 MCG tablet Take 250 mcg by mouth daily.      . [DISCONTINUED] Ferrous Sulfate (IRON) 325 (65 FE) MG TABS Take by mouth 3 (three) times daily.      . [DISCONTINUED] omeprazole (PRILOSEC) 20 MG  capsule Take 20 mg by mouth daily.       No current facility-administered medications on file prior to visit.    Allergies  Allergen Reactions  . Welchol [Colesevelam Hcl]     Stomach cramps  . Prolia [Denosumab]     Pain at injection sight    Family History  Problem Relation Age of Onset  . Asthma Mother   . Sarcoidosis Neg Hx   . Arthritis Paternal Grandmother   . Diabetes Mother   . Hypertension Father     Fathers family most  . Prostate cancer Father   . Other Neg Hx     osteoporosis  . Colon polyps Sister   . Multiple myeloma Sister   . Heart disease Paternal Grandmother   . Heart disease Paternal Uncle   . Kidney disease Sister   . Kidney disease Father    BP 132/84 mmHg  Pulse 72  Temp(Src) 98.3 F (36.8 C) (Oral)  Ht 5' 5" (1.651 m)  Wt 188 lb (85.276 kg)  BMI 31.28 kg/m2  SpO2 96%  Review of  Systems Denies falls.  GERD is well-controlled.      Objective:   Physical Exam VITAL SIGNS:  See vs page GENERAL: no distress Chest wall: no kyphosis. Gait: normal and steady.  Vit-D=24    Assessment & Plan:  Vit-D deficiency, worse again.   Osteoporosis, rx needed, followed by recheck.    Patient is advised the following: Patient Instructions  blood tests are being requested for you today.  We'll let you know about the results.  let's do an infusion called "reclast."  you will receive a phone call, about a day and time for an appointment.  Then please do the bone density 1 month or so later.   Please continue the same pepcid.   Take calcium 1200 mg per day, and vitamin-D, 400 units per day.   it is critically important to prevent falling down (keep floor areas well-lit, dry, and free of loose objects.  If you have a cane, walker, or wheelchair, you should use it, even for short trips around the house.  Wear flat-soled shoes.  Also, try not to rush) Please return in 1 year.     addendum: i have sent a prescription to your pharmacy, for another course of ergocalciferol.

## 2016-02-07 ENCOUNTER — Other Ambulatory Visit: Payer: Self-pay | Admitting: Endocrinology

## 2016-02-07 LAB — PTH, INTACT AND CALCIUM
Calcium: 9.5 mg/dL (ref 8.4–10.5)
PTH: 83 pg/mL — ABNORMAL HIGH (ref 14–64)

## 2016-02-07 MED ORDER — VITAMIN D (ERGOCALCIFEROL) 1.25 MG (50000 UNIT) PO CAPS: ORAL_CAPSULE | ORAL | Status: DC

## 2016-02-23 ENCOUNTER — Ambulatory Visit (INDEPENDENT_AMBULATORY_CARE_PROVIDER_SITE_OTHER): Payer: Medicare Other | Admitting: Internal Medicine

## 2016-02-23 ENCOUNTER — Other Ambulatory Visit (INDEPENDENT_AMBULATORY_CARE_PROVIDER_SITE_OTHER): Payer: Medicare Other

## 2016-02-23 ENCOUNTER — Encounter: Payer: Self-pay | Admitting: Internal Medicine

## 2016-02-23 VITALS — BP 120/80 | HR 77 | Temp 97.9°F | Resp 16 | Ht 65.0 in | Wt 188.0 lb

## 2016-02-23 DIAGNOSIS — T7840XA Allergy, unspecified, initial encounter: Secondary | ICD-10-CM

## 2016-02-23 DIAGNOSIS — E559 Vitamin D deficiency, unspecified: Secondary | ICD-10-CM

## 2016-02-23 DIAGNOSIS — I10 Essential (primary) hypertension: Secondary | ICD-10-CM | POA: Diagnosis not present

## 2016-02-23 DIAGNOSIS — N400 Enlarged prostate without lower urinary tract symptoms: Secondary | ICD-10-CM | POA: Diagnosis not present

## 2016-02-23 DIAGNOSIS — R972 Elevated prostate specific antigen [PSA]: Secondary | ICD-10-CM

## 2016-02-23 DIAGNOSIS — M810 Age-related osteoporosis without current pathological fracture: Secondary | ICD-10-CM

## 2016-02-23 DIAGNOSIS — D869 Sarcoidosis, unspecified: Secondary | ICD-10-CM | POA: Diagnosis not present

## 2016-02-23 LAB — CBC WITH DIFFERENTIAL/PLATELET
BASOS ABS: 0 10*3/uL (ref 0.0–0.1)
BASOS PCT: 0.7 % (ref 0.0–3.0)
EOS ABS: 0.3 10*3/uL (ref 0.0–0.7)
Eosinophils Relative: 6.1 % — ABNORMAL HIGH (ref 0.0–5.0)
HEMATOCRIT: 40.8 % (ref 39.0–52.0)
HEMOGLOBIN: 13.5 g/dL (ref 13.0–17.0)
LYMPHS PCT: 19.6 % (ref 12.0–46.0)
Lymphs Abs: 1 10*3/uL (ref 0.7–4.0)
MCHC: 33.1 g/dL (ref 30.0–36.0)
MCV: 82.4 fl (ref 78.0–100.0)
MONOS PCT: 10.7 % (ref 3.0–12.0)
Monocytes Absolute: 0.5 10*3/uL (ref 0.1–1.0)
NEUTROS ABS: 3.1 10*3/uL (ref 1.4–7.7)
Neutrophils Relative %: 62.9 % (ref 43.0–77.0)
PLATELETS: 190 10*3/uL (ref 150.0–400.0)
RBC: 4.96 Mil/uL (ref 4.22–5.81)
RDW: 16.1 % — AB (ref 11.5–15.5)
WBC: 5 10*3/uL (ref 4.0–10.5)

## 2016-02-23 LAB — COMPREHENSIVE METABOLIC PANEL
ALBUMIN: 4.3 g/dL (ref 3.5–5.2)
ALK PHOS: 90 U/L (ref 39–117)
ALT: 20 U/L (ref 0–53)
AST: 24 U/L (ref 0–37)
BUN: 17 mg/dL (ref 6–23)
CALCIUM: 9.7 mg/dL (ref 8.4–10.5)
CO2: 29 mEq/L (ref 19–32)
CREATININE: 1.5 mg/dL (ref 0.40–1.50)
Chloride: 108 mEq/L (ref 96–112)
GFR: 59.77 mL/min — ABNORMAL LOW (ref >60.00)
Glucose, Bld: 96 mg/dL (ref 70–99)
POTASSIUM: 4.2 meq/L (ref 3.5–5.1)
SODIUM: 144 meq/L (ref 135–145)
TOTAL PROTEIN: 7.6 g/dL (ref 6.0–8.3)
Total Bilirubin: 0.7 mg/dL (ref 0.2–1.2)

## 2016-02-23 LAB — SEDIMENTATION RATE: SED RATE: 11 mm/h (ref 0–22)

## 2016-02-23 MED ORDER — HYDROXYZINE HCL 10 MG PO TABS: 10.0000 mg | ORAL_TABLET | Freq: Three times a day (TID) | ORAL | Status: DC | PRN

## 2016-02-23 MED ORDER — VITAMIN D (ERGOCALCIFEROL) 1.25 MG (50000 UNIT) PO CAPS: ORAL_CAPSULE | ORAL | Status: DC

## 2016-02-23 MED ORDER — METHYLPREDNISOLONE ACETATE 80 MG/ML IJ SUSP
120.0000 mg | Freq: Once | INTRAMUSCULAR | Status: AC
  Administered 2016-02-23: 120 mg via INTRAMUSCULAR

## 2016-02-23 NOTE — Progress Notes (Signed)
Subjective:  Patient ID: Richard Blake, male    DOB: 1946-04-17  Age: 70 y.o. MRN: [id number]  CC: Hypertension and Allergic Reaction   HPI Richard Blake presents for a 4 day history of rash, swelling, and itching across both sides of his face. He used a hair dye about 5 days ago and the day after developed the symptoms. He has been putting a hydrocortisone cream on the area and his had some symptom relief. He has had no swelling around his eyes, lips, mouth, tongue or throat. He has no trouble breathing and has not noticed any coughing or wheezing.  Outpatient Prescriptions Prior to Visit  Medication Sig Dispense Refill  . acetaminophen (TYLENOL) 500 MG tablet Take 500 mg by mouth daily.      Marland Kitchen amLODipine (NORVASC) 10 MG tablet Take 1 tablet (10 mg total) by mouth daily. 90 tablet 3  . Dietary Management Product (FOSTEUM PLUS) CAPS Take 1 capsule by mouth 2 (two) times daily. 60 capsule 11  . famotidine (PEPCID) 20 MG tablet Take 20 mg by mouth at bedtime.      Marland Kitchen losartan (COZAAR) 100 MG tablet Take 1 tablet (100 mg total) by mouth daily. 90 tablet 3  . rosuvastatin (CRESTOR) 10 MG tablet Take 1 tablet (10 mg total) by mouth daily. 90 tablet 3  . vitamin B-12 (CYANOCOBALAMIN) 250 MCG tablet Take 250 mcg by mouth daily.      . Vitamin D, Ergocalciferol, (DRISDOL) 50000 units CAPS capsule 1 tab, twice a week 10 capsule 0   No facility-administered medications prior to visit.    ROS Review of Systems  Constitutional: Negative.  Negative for fever, chills, diaphoresis, appetite change and fatigue.  HENT: Positive for facial swelling. Negative for congestion, drooling, postnasal drip, rhinorrhea, sinus pressure, sneezing, sore throat, trouble swallowing and voice change.   Eyes: Negative.  Negative for visual disturbance.  Respiratory: Negative.  Negative for cough, choking, chest tightness, shortness of breath and stridor.   Cardiovascular: Negative.  Negative for chest pain,  palpitations and leg swelling.  Gastrointestinal: Negative.  Negative for nausea, vomiting, abdominal pain, diarrhea, constipation and blood in stool.  Endocrine: Negative.   Genitourinary: Negative.  Negative for difficulty urinating.  Musculoskeletal: Negative.  Negative for myalgias, back pain, arthralgias and neck pain.  Skin: Positive for rash. Negative for color change, pallor and wound.  Allergic/Immunologic: Negative.   Neurological: Negative.  Negative for dizziness.  Hematological: Negative.  Negative for adenopathy. Does not bruise/bleed easily.  Psychiatric/Behavioral: Negative.     Objective:  BP 120/80 mmHg  Pulse 77  Temp(Src) 97.9 F (36.6 C) (Oral)  Resp 16  Ht 5\' 5"  (1.651 m)  Wt 188 lb (85.276 kg)  BMI 31.28 kg/m2  SpO2 96%  BP Readings from Last 3 Encounters:  02/23/16 120/80  02/04/16 132/84  01/19/16 130/86    Wt Readings from Last 3 Encounters:  02/23/16 188 lb (85.276 kg)  02/04/16 188 lb (85.276 kg)  01/19/16 185 lb (83.915 kg)    Physical Exam  Constitutional: He is oriented to person, place, and time.  Non-toxic appearance. He does not have a sickly appearance. He does not appear ill. No distress.  HENT:  Head:    Mouth/Throat: Oropharynx is clear and moist and mucous membranes are normal. Mucous membranes are not pale, not dry and not cyanotic. No oral lesions. No trismus in the jaw. No uvula swelling. No oropharyngeal exudate, posterior oropharyngeal edema, posterior oropharyngeal erythema or tonsillar abscesses.  Eyes: Conjunctivae are normal. Right eye exhibits no discharge. Left eye exhibits no discharge. No scleral icterus.  Neck: Normal range of motion. Neck supple. No JVD present. No tracheal deviation present. No thyromegaly present.  Cardiovascular: Normal rate, regular rhythm, normal heart sounds and intact distal pulses.  Exam reveals no gallop and no friction rub.   No murmur heard. Pulmonary/Chest: Effort normal and breath sounds  normal. No stridor. No respiratory distress. He has no wheezes. He has no rales. He exhibits no tenderness.  Abdominal: Soft. Bowel sounds are normal. He exhibits no distension and no mass. There is no tenderness. There is no rebound and no guarding.  Musculoskeletal: Normal range of motion. He exhibits no edema or tenderness.  Lymphadenopathy:    He has no cervical adenopathy.  Neurological: He is oriented to person, place, and time.  Skin: Skin is warm and dry. No rash noted. He is not diaphoretic. No erythema. No pallor.  Vitals reviewed.   Lab Results  Component Value Date   WBC 5.0 02/23/2016   HGB 13.5 02/23/2016   HCT 40.8 02/23/2016   PLT 190.0 02/23/2016   GLUCOSE 96 02/23/2016   CHOL 169 01/19/2016   TRIG 79.0 01/19/2016   HDL 66.70 01/19/2016   LDLDIRECT 186.5 10/23/2012   LDLCALC 86 01/19/2016   ALT 20 02/23/2016   AST 24 02/23/2016   NA 144 02/23/2016   K 4.2 02/23/2016   CL 108 02/23/2016   CREATININE 1.50 02/23/2016   BUN 17 02/23/2016   CO2 29 02/23/2016   TSH 0.72 11/02/2015   PSA 3.67 02/23/2016    Dg Wrist Complete Left  11/02/2015  CLINICAL DATA:  Tripped in bedroom, left wrist pain after fall EXAM: LEFT WRIST - COMPLETE 3+ VIEW COMPARISON:  None. FINDINGS: Mild first carpal metacarpal joint arthritis. Nondisplaced fracture involves the articulating surface of the radius. There are no other acute abnormalities. IMPRESSION: Nondisplaced distal radius fracture Electronically Signed   By: Skipper Cliche M.D.   On: 11/02/2015 15:00    Assessment & Plan:   Richard Blake was seen today for hypertension and allergic reaction.  Diagnoses and all orders for this visit:  Essential hypertension- his blood pressure is well-controlled, electrolytes and renal function are stable. -     CBC with Differential/Platelet; Future -     Comprehensive metabolic panel; Future  PSA elevation- his PSA has trended down and is now in the normal range, we'll continue to monitor  this. -     PSA, total and free; Future  BPH (benign prostatic hyperplasia)- he has no signs or symptoms that need to be treated.  Sarcoidosis ([place])- his calcium level is in the normal range and there does not appear to be any flares of sarcoidosis -     Comprehensive metabolic panel; Future -     Sedimentation rate; Future  Allergic reaction, initial encounter- he will continue use topical steroid creams, his lab work does not show any systemic involvement, will use hydroxyzine as needed for itching and I gave him an injection of Depo-Medrol. -     CBC with Differential/Platelet; Future -     Comprehensive metabolic panel; Future -     Sedimentation rate; Future -     hydrOXYzine (ATARAX/VISTARIL) 10 MG tablet; Take 1 tablet (10 mg total) by mouth 3 (three) times daily as needed. -     methylPREDNISolone acetate (DEPO-MEDROL) injection 120 mg; Inject 1.5 mLs (120 mg total) into the muscle once.  Osteoporosis -  Vitamin D, Ergocalciferol, (DRISDOL) 50000 units CAPS capsule; 1 tab, twice a week  Vitamin D deficiency -     Vitamin D, Ergocalciferol, (DRISDOL) 50000 units CAPS capsule; 1 tab, twice a week   I am having Mr. Gonzales start on hydrOXYzine. I am also having him maintain his vitamin B-12, famotidine, acetaminophen, amLODipine, losartan, rosuvastatin, FOSTEUM PLUS, and Vitamin D (Ergocalciferol). We administered methylPREDNISolone acetate.  Meds ordered this encounter  Medications  . Vitamin D, Ergocalciferol, (DRISDOL) 50000 units CAPS capsule    Sig: 1 tab, twice a week    Dispense:  12 capsule    Refill:  3  . hydrOXYzine (ATARAX/VISTARIL) 10 MG tablet    Sig: Take 1 tablet (10 mg total) by mouth 3 (three) times daily as needed.    Dispense:  30 tablet    Refill:  0  . methylPREDNISolone acetate (DEPO-MEDROL) injection 120 mg    Sig:      Follow-up: Return if symptoms worsen or fail to improve.  Scarlette Calico, MD

## 2016-02-23 NOTE — Progress Notes (Signed)
Pre visit review using our clinic review tool, if applicable. No additional management support is needed unless otherwise documented below in the visit note. 

## 2016-02-23 NOTE — Patient Instructions (Signed)
Drug Allergy Allergic reactions to medicines are common. Some allergic reactions are mild. A delayed type of drug allergy that occurs 1 week or more after exposure to a medicine or vaccine is called serum sickness. A life-threatening, sudden (acute) allergic reaction that involves the whole body is called anaphylaxis. CAUSES  "True" drug allergies occur when there is an allergic reaction to a medicine. This is caused by overactivity of the immune system. First, the body becomes sensitized. The immune system is triggered by your first exposure to the medicine. Following this first exposure, future exposure to the same medicine may be life-threatening. Almost any medicine can cause an allergic reaction. Common ones are:  Penicillin.  Sulfonamides (sulfa drugs).  Local anesthetics.  X-ray dyes that contain iodine. SYMPTOMS  Common symptoms of a minor allergic reaction are:  Swelling around the mouth.  An itchy red rash or hives.  Vomiting or diarrhea. Anaphylaxis can cause swelling of the mouth and throat. This makes it difficult to breathe and swallow. Severe reactions can be fatal within seconds, even after exposure to only a trace amount of the drug that causes the reaction. HOME CARE INSTRUCTIONS  If you are unsure of what caused your reaction, write down:  The names of the medicines you took.  How much medicine you took.  How you took the medicine, such as whether you took a pill, injected the medicine, or applied it to your skin.  All of the things you ate and drank.  The date and time of your reaction.  The symptoms of the reaction.  You may want to follow up with an allergy specialist after the reaction has cleared in order to be tested to confirm the allergy. It is important to confirm that your reaction is an allergy, not just a side effect to the medicine. If you have a true allergy to a medicine, this may prevent that medicine and related medicines from being given to  you when you are very ill.  If you have hives or a rash:  Take medicines as directed by your caregiver.  You may use an over-the-counter antihistamine (diphenhydramine) as needed.  Apply cold compresses to the skin or take baths in cool water. Avoid hot baths or showers.  If you are severely allergic:  Continuous observation after a severe reaction may be needed. Hospitalization is often required.  Wear a medical alert bracelet or necklace stating your allergy.  You and your family must learn how to use an anaphylaxis kit or give an epinephrine injection to temporarily treat an emergency allergic reaction. If you have had a severe reaction, always carry your epinephrine injection or anaphylaxis kit with you. This can be lifesaving if you have a severe reaction.  Do not drive or perform tasks after treatment until the medicines used to treat your reaction have worn off, or until your caregiver says it is okay.  If you have a drug allergy that was confirmed by your health care provider:  Carry information about the drug allergy with you at all times.  Always check with a pharmacist before taking any over-the-counter medicine. SEEK MEDICAL CARE IF:   You think you had an allergic reaction. Symptoms usually start within 30 minutes after exposure.  Symptoms are getting worse rather than better.  You develop new symptoms.  The symptoms that brought you to your caregiver return. SEEK IMMEDIATE MEDICAL CARE IF:   You have swelling of the mouth, difficulty breathing, or wheezing.  You have a tight  feeling in your chest or throat.  You develop hives, swelling, or itching all over your body.  You develop severe vomiting or diarrhea.  You feel faint or pass out. This is an emergency. Use your epinephrine injection or anaphylaxis kit as you have been instructed. Call for emergency medical help. Even if you improve after the injection, you need to be examined at a hospital emergency  department. MAKE SURE YOU:   Understand these instructions.  Will watch your condition.  Will get help right away if you are not doing well or get worse.   This information is not intended to replace advice given to you by your health care provider. Make sure you discuss any questions you have with your health care provider.   Document Released: 10/30/2005 Document Revised: 11/20/2014 Document Reviewed: 06/01/2015 Elsevier Interactive Patient Education [employer].

## 2016-02-24 ENCOUNTER — Encounter: Payer: Self-pay | Admitting: Internal Medicine

## 2016-02-24 LAB — PSA, TOTAL AND FREE
PSA FREE PCT: 21 % — AB (ref >25)
PSA FREE: 0.76 ng/mL
PSA: 3.67 ng/mL (ref <=4.00)

## 2016-03-08 ENCOUNTER — Ambulatory Visit (AMBULATORY_SURGERY_CENTER): Payer: Self-pay

## 2016-03-08 VITALS — Ht 65.0 in | Wt 187.4 lb

## 2016-03-08 DIAGNOSIS — Z1211 Encounter for screening for malignant neoplasm of colon: Secondary | ICD-10-CM

## 2016-03-08 NOTE — Progress Notes (Signed)
No allergies to eggs or soy No home oxygen No diet meds No past problems with anesthesia  Has email and internet; refused emmi

## 2016-03-13 ENCOUNTER — Encounter: Payer: Self-pay | Admitting: Internal Medicine

## 2016-03-14 ENCOUNTER — Encounter: Payer: Self-pay | Admitting: Gastroenterology

## 2016-03-22 ENCOUNTER — Encounter: Payer: Medicare Other | Admitting: Gastroenterology

## 2016-04-20 ENCOUNTER — Ambulatory Visit (INDEPENDENT_AMBULATORY_CARE_PROVIDER_SITE_OTHER): Payer: Medicare Other | Admitting: Internal Medicine

## 2016-04-20 ENCOUNTER — Encounter: Payer: Self-pay | Admitting: Internal Medicine

## 2016-04-20 ENCOUNTER — Ambulatory Visit (INDEPENDENT_AMBULATORY_CARE_PROVIDER_SITE_OTHER)
Admission: RE | Admit: 2016-04-20 | Discharge: 2016-04-20 | Disposition: A | Discharge status: Home / Self Care | Payer: Medicare Other | Source: Ambulatory Visit | Attending: Internal Medicine | Admitting: Internal Medicine

## 2016-04-20 VITALS — BP 140/82 | HR 69 | Temp 97.8°F | Resp 16 | Ht 65.0 in | Wt 182.0 lb

## 2016-04-20 DIAGNOSIS — D869 Sarcoidosis, unspecified: Secondary | ICD-10-CM

## 2016-04-20 DIAGNOSIS — R05 Cough: Secondary | ICD-10-CM | POA: Diagnosis not present

## 2016-04-20 DIAGNOSIS — Z23 Encounter for immunization: Secondary | ICD-10-CM

## 2016-04-20 DIAGNOSIS — R059 Cough, unspecified: Secondary | ICD-10-CM

## 2016-04-20 NOTE — Progress Notes (Signed)
Pre visit review using our clinic review tool, if applicable. No additional management support is needed unless otherwise documented below in the visit note. 

## 2016-04-20 NOTE — Patient Instructions (Signed)

## 2016-04-20 NOTE — Progress Notes (Signed)
Subjective:  Patient ID: Richard Blake, male    DOB: 12/20/1945  Age: 70 y.o. MRN: [id number]  CC: Cough   HPI IVEY BUNGERT presents for evaluation of chronic recurrent episodes of nonproductive cough. His cough returned a couple of months ago and is not associated with any production of phlegm or blood, he did denies wheezing/shortness of breath/night sweats/fever/chills/weight loss/or chest pain. He does have a history of sarcoidosis.  Outpatient Prescriptions Prior to Visit  Medication Sig Dispense Refill  . acetaminophen (TYLENOL) 500 MG tablet Take 500 mg by mouth daily.      Marland Kitchen amLODipine (NORVASC) 10 MG tablet Take 1 tablet (10 mg total) by mouth daily. 90 tablet 3  . Dietary Management Product (FOSTEUM PLUS) CAPS Take 1 capsule by mouth 2 (two) times daily. 60 capsule 11  . famotidine (PEPCID) 20 MG tablet Take 20 mg by mouth at bedtime.      . hydrOXYzine (ATARAX/VISTARIL) 10 MG tablet Take 1 tablet (10 mg total) by mouth 3 (three) times daily as needed. 30 tablet 0  . losartan (COZAAR) 100 MG tablet Take 1 tablet (100 mg total) by mouth daily. 90 tablet 3  . rosuvastatin (CRESTOR) 10 MG tablet Take 1 tablet (10 mg total) by mouth daily. 90 tablet 3  . vitamin B-12 (CYANOCOBALAMIN) 250 MCG tablet Take 250 mcg by mouth daily.      . Vitamin D, Ergocalciferol, (DRISDOL) 50000 units CAPS capsule 1 tab, twice a week 12 capsule 3   No facility-administered medications prior to visit.    ROS Review of Systems  Constitutional: Negative.  Negative for fever, chills, diaphoresis, appetite change and fatigue.  HENT: Negative.  Negative for congestion, facial swelling, sinus pressure, sore throat, trouble swallowing and voice change.   Eyes: Negative.  Negative for visual disturbance.  Respiratory: Positive for cough. Negative for apnea, choking, chest tightness, shortness of breath, wheezing and stridor.   Cardiovascular: Negative.  Negative for chest pain, palpitations and leg  swelling.  Gastrointestinal: Negative.  Negative for nausea, vomiting, abdominal pain, diarrhea, constipation and blood in stool.  Endocrine: Negative.   Genitourinary: Negative.   Musculoskeletal: Negative.  Negative for myalgias, back pain, joint swelling and arthralgias.  Skin: Negative.  Negative for color change, pallor and rash.  Allergic/Immunologic: Negative.   Neurological: Negative.  Negative for dizziness, tremors, weakness, light-headedness and headaches.  Hematological: Negative.  Negative for adenopathy. Does not bruise/bleed easily.  Psychiatric/Behavioral: Negative.     Objective:  BP 140/82 mmHg  Pulse 69  Temp(Src) 97.8 F (36.6 C) (Oral)  Resp 16  Ht 5\' 5"  (1.651 m)  Wt 182 lb (82.555 kg)  BMI 30.29 kg/m2  SpO2 98%  BP Readings from Last 3 Encounters:  04/20/16 140/82  02/23/16 120/80  02/04/16 132/84    Wt Readings from Last 3 Encounters:  04/20/16 182 lb (82.555 kg)  03/08/16 187 lb 6.4 oz (85.004 kg)  02/23/16 188 lb (85.276 kg)    Physical Exam  Constitutional: He is oriented to person, place, and time. He appears well-developed and well-nourished. No distress.  HENT:  Head: Normocephalic and atraumatic.  Mouth/Throat: Oropharynx is clear and moist. No oropharyngeal exudate.  Eyes: Conjunctivae are normal. Right eye exhibits no discharge. Left eye exhibits no discharge. No scleral icterus.  Neck: Normal range of motion. Neck supple. No JVD present. No tracheal deviation present. No thyromegaly present.  Cardiovascular: Normal rate, regular rhythm, normal heart sounds and intact distal pulses.  Exam reveals no  gallop and no friction rub.   No murmur heard. Pulmonary/Chest: Effort normal and breath sounds normal. No stridor. No respiratory distress. He has no wheezes. He has no rales. He exhibits no tenderness.  Abdominal: Soft. Bowel sounds are normal. He exhibits no distension and no mass. There is no tenderness. There is no rebound and no guarding.   Musculoskeletal: Normal range of motion. He exhibits no edema or tenderness.  Lymphadenopathy:    He has no cervical adenopathy.  Neurological: He is oriented to person, place, and time.  Skin: Skin is warm and dry. No rash noted. He is not diaphoretic. No erythema. No pallor.  Psychiatric: He has a normal mood and affect. His behavior is normal. Judgment and thought content normal.  Vitals reviewed.   Lab Results  Component Value Date   WBC 5.0 02/23/2016   HGB 13.5 02/23/2016   HCT 40.8 02/23/2016   PLT 190.0 02/23/2016   GLUCOSE 96 02/23/2016   CHOL 169 01/19/2016   TRIG 79.0 01/19/2016   HDL 66.70 01/19/2016   LDLDIRECT 186.5 10/23/2012   LDLCALC 86 01/19/2016   ALT 20 02/23/2016   AST 24 02/23/2016   NA 144 02/23/2016   K 4.2 02/23/2016   CL 108 02/23/2016   CREATININE 1.50 02/23/2016   BUN 17 02/23/2016   CO2 29 02/23/2016   TSH 0.72 11/02/2015   PSA 3.67 02/23/2016   144  140       Potassium 3.5 - 5.1 mEq/L 4.2  4.2    Chloride 96 - 112 mEq/L 108  105    CO2 19 - 32 mEq/L 29  28    Glucose, Bld 70 - 99 mg/dL 96  83    BUN 6 - 23 mg/dL 17  13    Creatinine, Ser 0.40 - 1.50 mg/dL 1.50  1.22    Total Bilirubin 0.2 - 1.2 mg/dL 0.7      Alkaline Phosphatase 39 - 117 U/L 90      AST 0 - 37 U/L 24      ALT 0 - 53 U/L 20      Total Protein 6.0 - 8.3 g/dL 7.6      Albumin 3.5 - 5.2 g/dL 4.3      Calcium 8.4 - 10.5 mg/dL 9.7 9.5CM 9.6    GFR >60.00 mL/min 59.77 (L)  60.00 mL/min" class="[email or web address]" style="cursor: pointer;" onmouseover='jscript: var varStyle="underline"; var bgColor="#[id number]"; this.style.backgroundColor=bgColor; var children=this.getElementsByTagName("div"); for(var child=0;child 75.89   Resulting Agency  [facility] [employer] [facility]      Specimen Collected: 02/23/16 10:41 AM Last Resulted: 02/23/16 12:47 PM             CM=Additional comments           Dg Wrist Complete Left  11/02/2015  CLINICAL DATA:  Tripped in  bedroom, left wrist pain after fall EXAM: LEFT WRIST - COMPLETE 3+ VIEW COMPARISON:  None. FINDINGS: Mild first carpal metacarpal joint arthritis. Nondisplaced fracture involves the articulating surface of the radius. There are no other acute abnormalities. IMPRESSION: Nondisplaced distal radius fracture Electronically Signed   By: Skipper Cliche M.D.   On: 11/02/2015 15:00    Assessment & Plan:   Luken was seen today for cough.  Diagnoses and all orders for this visit:  Sarcoidosis ([place])- his recent calcium level was normal and his chest x-ray today does not show any signs suspicious for sarcoidosis, I offered to prescribe a course of oral steroids to help with the symptoms but he deferred  on that option at this time. -     DG Chest 2 View; Future -     Pulmonary Function Test; Future  Cough- I'm concerned he may have cough variant asthma though he is not willing to use an inhaler at this time, I've asked him to undergo pulmonary functioning testing to evaluate this further. For now he does not want any treatment for the cough. -     DG Chest 2 View; Future -     Pulmonary Function Test; Future  Need for zoster vaccination -     Varicella-zoster vaccine subcutaneous   I am having Mr. Chrissie Noa maintain his vitamin B-12, famotidine, acetaminophen, amLODipine, losartan, rosuvastatin, FOSTEUM PLUS, Vitamin D (Ergocalciferol), and hydrOXYzine.  No orders of the defined types were placed in this encounter.     Follow-up: Return in about 6 weeks (around 06/01/2016).  Scarlette Calico, MD

## 2016-04-22 ENCOUNTER — Encounter: Payer: Self-pay | Admitting: Internal Medicine

## 2016-06-28 ENCOUNTER — Ambulatory Visit (AMBULATORY_SURGERY_CENTER): Payer: Self-pay | Admitting: *Deleted

## 2016-06-28 VITALS — Ht 65.0 in | Wt 178.0 lb

## 2016-06-28 DIAGNOSIS — Z1211 Encounter for screening for malignant neoplasm of colon: Secondary | ICD-10-CM

## 2016-06-28 NOTE — Progress Notes (Signed)
No egg or soy allergy known to patient  No issues with past sedation with any surgeries  or procedures, no intubation problems  No diet pills per patient No home 02 use per patient  No blood thinners per patient  Pt denies issues with constipation   Pt had a PV 03-08-2016 and was given miralax/ gatorade instructions- he purchased the prep material and had to cancel his colon then. Because he has this at home, he wishes to use the same prep for his colon 8-28 Monday

## 2016-07-06 ENCOUNTER — Encounter (INDEPENDENT_AMBULATORY_CARE_PROVIDER_SITE_OTHER): Payer: Medicare Other | Admitting: Internal Medicine

## 2016-07-06 DIAGNOSIS — D869 Sarcoidosis, unspecified: Secondary | ICD-10-CM

## 2016-07-06 DIAGNOSIS — R05 Cough: Secondary | ICD-10-CM

## 2016-07-06 DIAGNOSIS — R059 Cough, unspecified: Secondary | ICD-10-CM

## 2016-07-06 LAB — PULMONARY FUNCTION TEST
DL/VA % PRED: 82 %
DL/VA: 3.53 ml/min/mmHg/L
DLCO cor % pred: 71 %
DLCO cor: 18.2 ml/min/mmHg
DLCO unc % pred: 63 %
DLCO unc: 16.38 ml/min/mmHg
FEF 25-75 Post: 2.76 L/sec
FEF 25-75 Pre: 2.14 L/sec
FEF2575-%CHANGE-POST: 28 %
FEF2575-%Pred-Post: 132 %
FEF2575-%Pred-Pre: 102 %
FEV1-%Change-Post: 5 %
FEV1-%PRED-PRE: 105 %
FEV1-%Pred-Post: 111 %
FEV1-POST: 2.62 L
FEV1-Pre: 2.49 L
FEV1FVC-%Change-Post: 5 %
FEV1FVC-%PRED-PRE: 100 %
FEV6-%CHANGE-POST: -1 %
FEV6-%PRED-POST: 106 %
FEV6-%Pred-Pre: 107 %
FEV6-Post: 3.15 L
FEV6-Pre: 3.2 L
FEV6FVC-%Change-Post: 0 %
FEV6FVC-%PRED-POST: 104 %
FEV6FVC-%PRED-PRE: 105 %
FVC-%CHANGE-POST: 0 %
FVC-%PRED-POST: 101 %
FVC-%PRED-PRE: 102 %
FVC-POST: 3.19 L
FVC-Pre: 3.2 L
POST FEV1/FVC RATIO: 82 %
POST FEV6/FVC RATIO: 99 %
PRE FEV6/FVC RATIO: 100 %
Pre FEV1/FVC ratio: 78 %
RV % pred: 103 %
RV: 2.22 L
TLC % PRED: 101 %
TLC: 6.11 L

## 2016-07-10 ENCOUNTER — Ambulatory Visit (AMBULATORY_SURGERY_CENTER): Payer: Medicare Other | Admitting: Gastroenterology

## 2016-07-10 ENCOUNTER — Encounter: Payer: Self-pay | Admitting: Gastroenterology

## 2016-07-10 VITALS — BP 116/64 | HR 61 | Temp 99.1°F | Resp 20 | Ht 65.0 in | Wt 178.0 lb

## 2016-07-10 DIAGNOSIS — K5730 Diverticulosis of large intestine without perforation or abscess without bleeding: Secondary | ICD-10-CM | POA: Diagnosis not present

## 2016-07-10 DIAGNOSIS — Z1211 Encounter for screening for malignant neoplasm of colon: Secondary | ICD-10-CM | POA: Diagnosis not present

## 2016-07-10 DIAGNOSIS — Z86010 Personal history of colonic polyps: Secondary | ICD-10-CM | POA: Diagnosis not present

## 2016-07-10 DIAGNOSIS — I10 Essential (primary) hypertension: Secondary | ICD-10-CM | POA: Diagnosis not present

## 2016-07-10 LAB — HM COLONOSCOPY

## 2016-07-10 MED ORDER — SODIUM CHLORIDE 0.9 % IV SOLN: 500.0000 mL | INTRAVENOUS | Status: DC

## 2016-07-10 NOTE — Patient Instructions (Signed)
YOU HAD AN ENDOSCOPIC PROCEDURE TODAY AT [facility]:   Refer to the procedure report that was given to you for any specific questions about what was found during the examination.  If the procedure report does not answer your questions, please call your gastroenterologist to clarify.  If you requested that your care partner not be given the details of your procedure findings, then the procedure report has been included in a sealed envelope for you to review at your convenience later.  YOU SHOULD EXPECT: Some feelings of bloating in the abdomen. Passage of more gas than usual.  Walking can help get rid of the air that was put into your GI tract during the procedure and reduce the bloating. If you had a lower endoscopy (such as a colonoscopy or flexible sigmoidoscopy) you may notice spotting of blood in your stool or on the toilet paper. If you underwent a bowel prep for your procedure, you may not have a normal bowel movement for a few days.  Please Note:  You might notice some irritation and congestion in your nose or some drainage.  This is from the oxygen used during your procedure.  There is no need for concern and it should clear up in a day or so.  SYMPTOMS TO REPORT IMMEDIATELY:   Following lower endoscopy (colonoscopy or flexible sigmoidoscopy):  Excessive amounts of blood in the stool  Significant tenderness or worsening of abdominal pains  Swelling of the abdomen that is new, acute  Fever of 100F or higher    For urgent or emergent issues, a gastroenterologist can be reached at any hour by calling [phone].   DIET:  We do recommend a small meal at first, but then you may proceed to your regular diet.  Drink plenty of fluids but you should avoid alcoholic beverages for 24 hours.  ACTIVITY:  You should plan to take it easy for the rest of today and you should NOT DRIVE or use heavy machinery until tomorrow (because of the sedation medicines used during the test).     FOLLOW UP: Our staff will call the number listed on your records the next business day following your procedure to check on you and address any questions or concerns that you may have regarding the information given to you following your procedure. If we do not reach you, we will leave a message.  However, if you are feeling well and you are not experiencing any problems, there is no need to return our call.  We will assume that you have returned to your regular daily activities without incident.  If any biopsies were taken you will be contacted by phone or by letter within the next 1-3 weeks.  Please call us at [phone] if you have not heard about the biopsies in 3 weeks.    SIGNATURES/CONFIDENTIALITY: You and/or your care partner have signed paperwork which will be entered into your electronic medical record.  These signatures attest to the fact that that the information above on your After Visit Summary has been reviewed and is understood.  Full responsibility of the confidentiality of this discharge information lies with you and/or your care-partner.    Resume medications. Information given on diverticulosis and high fiber diet.

## 2016-07-10 NOTE — Op Note (Signed)
[facility] Patient Name: Richard Blake Procedure Date: 07/10/2016 2:06 PM MRN: [id number] Endoscopist: Milus Banister , MD Age: 70 Referring MD:  Date of Birth: [birth year] Gender: Male Account #: [id number] Procedure:                Colonoscopy Indications:              Screening for colorectal malignant neoplasm,                            colonoscopy 2007 Dr. Ardis Hughs, no polyps Medicines:                Monitored Anesthesia Care Procedure:                Pre-Anesthesia Assessment:                           - Prior to the procedure, a History and Physical                            was performed, and patient medications and                            allergies were reviewed. The patient's tolerance of                            previous anesthesia was also reviewed. The risks                            and benefits of the procedure and the sedation                            options and risks were discussed with the patient.                            All questions were answered, and informed consent                            was obtained. Prior Anticoagulants: The patient has                            taken no previous anticoagulant or antiplatelet                            agents. ASA Grade Assessment: II - A patient with                            mild systemic disease. After reviewing the risks                            and benefits, the patient was deemed in                            satisfactory condition to undergo the procedure.  After obtaining informed consent, the colonoscope                            was passed under direct vision. Throughout the                            procedure, the patient's blood pressure, pulse, and                            oxygen saturations were monitored continuously. The                            Model PCF-H190L [id number]) scope was introduced                            through the anus and  advanced to the the cecum,                            identified by appendiceal orifice and ileocecal                            valve. The colonoscopy was performed without                            difficulty. The patient tolerated the procedure                            well. The quality of the bowel preparation was                            good. The ileocecal valve, appendiceal orifice, and                            rectum were photographed. Scope In: 2:10:37 PM Scope Out: 2:20:59 PM Scope Withdrawal Time: 0 hours 8 minutes 31 seconds  Total Procedure Duration: 0 hours 10 minutes 22 seconds  Findings:                 A few small and large-mouthed diverticula were                            found in the entire colon.                           The exam was otherwise without abnormality on                            direct and retroflexion views. Complications:            No immediate complications. Estimated blood loss:                            None. Estimated Blood Loss:     Estimated blood loss: none. Impression:               - Diverticulosis in the entire  examined colon.                           - The examination was otherwise normal on direct                            and retroflexion views.                           - No polyps or cancers Recommendation:           - Patient has a contact number available for                            emergencies. The signs and symptoms of potential                            delayed complications were discussed with the                            patient. Return to normal activities tomorrow.                            Written discharge instructions were provided to the                            patient.                           - Resume previous diet.                           - Continue present medications.                           - Repeat colonoscopy in 10 years for screening                            purposes. Milus Banister, MD 07/10/2016 2:23:36 PM This report has been signed electronically.

## 2016-07-10 NOTE — Progress Notes (Signed)
Report given to PACU RN, vss 

## 2016-07-11 ENCOUNTER — Telehealth: Payer: Self-pay

## 2016-07-11 NOTE — Telephone Encounter (Signed)
  Follow up Call-  Call back number 07/10/2016  Post procedure Call Back phone  # [phone]  Permission to leave phone message Yes  Some recent data might be hidden     Patient questions:  Do you have a fever, pain , or abdominal swelling? No. Pain Score  0 *  Have you tolerated food without any problems? Yes.    Have you been able to return to your normal activities? Yes.    Do you have any questions about your discharge instructions: Diet   No. Medications  No. Follow up visit  No.  Do you have questions or concerns about your Care? No.  Actions: * If pain score is 4 or above: No action needed, pain <4.

## 2016-07-13 LAB — HM COLONOSCOPY

## 2016-07-25 DIAGNOSIS — Z23 Encounter for immunization: Secondary | ICD-10-CM | POA: Diagnosis not present

## 2016-07-27 ENCOUNTER — Encounter: Payer: Self-pay | Admitting: Internal Medicine

## 2016-08-25 ENCOUNTER — Encounter: Payer: Self-pay | Admitting: Internal Medicine

## 2016-11-13 DIAGNOSIS — I4891 Unspecified atrial fibrillation: Secondary | ICD-10-CM

## 2016-11-13 HISTORY — DX: Unspecified atrial fibrillation: I48.91

## 2016-11-23 ENCOUNTER — Ambulatory Visit (INDEPENDENT_AMBULATORY_CARE_PROVIDER_SITE_OTHER)
Admission: RE | Admit: 2016-11-23 | Discharge: 2016-11-23 | Disposition: A | Discharge status: Home / Self Care | Payer: Medicare Other | Source: Ambulatory Visit | Attending: Internal Medicine | Admitting: Internal Medicine

## 2016-11-23 ENCOUNTER — Ambulatory Visit (INDEPENDENT_AMBULATORY_CARE_PROVIDER_SITE_OTHER): Payer: Medicare Other | Admitting: Internal Medicine

## 2016-11-23 ENCOUNTER — Encounter: Payer: Self-pay | Admitting: Internal Medicine

## 2016-11-23 ENCOUNTER — Other Ambulatory Visit (INDEPENDENT_AMBULATORY_CARE_PROVIDER_SITE_OTHER): Payer: Medicare Other

## 2016-11-23 VITALS — BP 128/70 | HR 71 | Temp 98.2°F | Resp 16 | Ht 65.0 in | Wt 192.0 lb

## 2016-11-23 DIAGNOSIS — I4819 Other persistent atrial fibrillation: Secondary | ICD-10-CM

## 2016-11-23 DIAGNOSIS — R600 Localized edema: Secondary | ICD-10-CM | POA: Insufficient documentation

## 2016-11-23 DIAGNOSIS — R059 Cough, unspecified: Secondary | ICD-10-CM

## 2016-11-23 DIAGNOSIS — R05 Cough: Secondary | ICD-10-CM

## 2016-11-23 DIAGNOSIS — I10 Essential (primary) hypertension: Secondary | ICD-10-CM | POA: Diagnosis not present

## 2016-11-23 DIAGNOSIS — K449 Diaphragmatic hernia without obstruction or gangrene: Secondary | ICD-10-CM

## 2016-11-23 DIAGNOSIS — I481 Persistent atrial fibrillation: Secondary | ICD-10-CM | POA: Diagnosis not present

## 2016-11-23 DIAGNOSIS — R0609 Other forms of dyspnea: Secondary | ICD-10-CM

## 2016-11-23 LAB — COMPREHENSIVE METABOLIC PANEL
ALBUMIN: 4 g/dL (ref 3.5–5.2)
ALK PHOS: 126 U/L — AB (ref 39–117)
ALT: 21 U/L (ref 0–53)
AST: 24 U/L (ref 0–37)
BILIRUBIN TOTAL: 0.8 mg/dL (ref 0.2–1.2)
BUN: 17 mg/dL (ref 6–23)
CALCIUM: 9.5 mg/dL (ref 8.4–10.5)
CO2: 29 mEq/L (ref 19–32)
CREATININE: 1.51 mg/dL — AB (ref 0.40–1.50)
Chloride: 106 mEq/L (ref 96–112)
GFR: 59.18 mL/min — ABNORMAL LOW (ref >60.00)
Glucose, Bld: 83 mg/dL (ref 70–99)
Potassium: 4 mEq/L (ref 3.5–5.1)
Sodium: 143 mEq/L (ref 135–145)
TOTAL PROTEIN: 7.3 g/dL (ref 6.0–8.3)

## 2016-11-23 LAB — CBC WITH DIFFERENTIAL/PLATELET
BASOS ABS: 0 10*3/uL (ref 0.0–0.1)
BASOS PCT: 0.4 % (ref 0.0–3.0)
EOS ABS: 0.2 10*3/uL (ref 0.0–0.7)
Eosinophils Relative: 3.6 % (ref 0.0–5.0)
HEMATOCRIT: 35.8 % — AB (ref 39.0–52.0)
HEMOGLOBIN: 11.6 g/dL — AB (ref 13.0–17.0)
LYMPHS PCT: 15.3 % (ref 12.0–46.0)
Lymphs Abs: 0.8 10*3/uL (ref 0.7–4.0)
MCHC: 32.5 g/dL (ref 30.0–36.0)
MCV: 80.8 fl (ref 78.0–100.0)
MONOS PCT: 10 % (ref 3.0–12.0)
Monocytes Absolute: 0.5 10*3/uL (ref 0.1–1.0)
Neutro Abs: 3.9 10*3/uL (ref 1.4–7.7)
Neutrophils Relative %: 70.7 % (ref 43.0–77.0)
Platelets: 183 10*3/uL (ref 150.0–400.0)
RBC: 4.43 Mil/uL (ref 4.22–5.81)
RDW: 15.4 % (ref 11.5–15.5)
WBC: 5.5 10*3/uL (ref 4.0–10.5)

## 2016-11-23 LAB — URINALYSIS, ROUTINE W REFLEX MICROSCOPIC
BILIRUBIN URINE: NEGATIVE
Hgb urine dipstick: NEGATIVE
Ketones, ur: NEGATIVE
Leukocytes, UA: NEGATIVE
Nitrite: NEGATIVE
PH: 6 (ref 5.0–8.0)
SPECIFIC GRAVITY, URINE: 1.02 (ref 1.000–1.030)
Total Protein, Urine: NEGATIVE
UROBILINOGEN UA: 0.2 (ref 0.0–1.0)
Urine Glucose: NEGATIVE

## 2016-11-23 LAB — TROPONIN I: TNIDX: 0 ug/l (ref 0.00–0.06)

## 2016-11-23 LAB — BRAIN NATRIURETIC PEPTIDE: Pro B Natriuretic peptide (BNP): 281 pg/mL — ABNORMAL HIGH (ref 0.0–100.0)

## 2016-11-23 MED ORDER — RIVAROXABAN 20 MG PO TABS: 20.0000 mg | ORAL_TABLET | Freq: Every day | ORAL | 0 refills | Status: DC

## 2016-11-23 NOTE — Progress Notes (Signed)
Pre visit review using our clinic review tool, if applicable. No additional management support is needed unless otherwise documented below in the visit note. 

## 2016-11-23 NOTE — Patient Instructions (Signed)
Atrial Fibrillation Atrial fibrillation is a type of irregular or rapid heartbeat (arrhythmia). In atrial fibrillation, the heart quivers continuously in a chaotic pattern. This occurs when parts of the heart receive disorganized signals that make the heart unable to pump blood normally. This can increase the risk for stroke, heart failure, and other heart-related conditions. There are different types of atrial fibrillation, including:  Paroxysmal atrial fibrillation. This type starts suddenly, and it usually stops on its own shortly after it starts.  Persistent atrial fibrillation. This type often lasts longer than a week. It may stop on its own or with treatment.  Long-lasting persistent atrial fibrillation. This type lasts longer than 12 months.  Permanent atrial fibrillation. This type does not go away.  Talk with your health care provider to learn about the type of atrial fibrillation that you have. What are the causes? This condition is caused by some heart-related conditions or procedures, including:  A heart attack.  Coronary artery disease.  Heart failure.  Heart valve conditions.  High blood pressure.  Inflammation of the sac that surrounds the heart (pericarditis).  Heart surgery.  Certain heart rhythm disorders, such as Wolf-Parkinson-White syndrome.  Other causes include:  Pneumonia.  Obstructive sleep apnea.  Blockage of an artery in the lungs (pulmonary embolism, or PE).  Lung cancer.  Chronic lung disease.  Thyroid problems, especially if the thyroid is overactive (hyperthyroidism).  Caffeine.  Excessive alcohol use or illegal drug use.  Use of some medicines, including certain decongestants and diet pills.  Sometimes, the cause cannot be found. What increases the risk? This condition is more likely to develop in:  People who are older in age.  People who smoke.  People who have diabetes mellitus.  People who are overweight  (obese).  Athletes who exercise vigorously.  What are the signs or symptoms? Symptoms of this condition include:  A feeling that your heart is beating rapidly or irregularly.  A feeling of discomfort or pain in your chest.  Shortness of breath.  Sudden light-headedness or weakness.  Getting tired easily during exercise.  In some cases, there are no symptoms. How is this diagnosed? Your health care provider may be able to detect atrial fibrillation when taking your pulse. If detected, this condition may be diagnosed with:  An electrocardiogram (ECG).  A Holter monitor test that records your heartbeat patterns over a 24-hour period.  Transthoracic echocardiogram (TTE) to evaluate how blood flows through your heart.  Transesophageal echocardiogram (TEE) to view more detailed images of your heart.  A stress test.  Imaging tests, such as a CT scan or chest X-ray.  Blood tests.  How is this treated? The main goals of treatment are to prevent blood clots from forming and to keep your heart beating at a normal rate and rhythm. The type of treatment that you receive depends on many factors, such as your underlying medical conditions and how you feel when you are experiencing atrial fibrillation. This condition may be treated with:  Medicine to slow down the heart rate, bring the heart's rhythm back to normal, or prevent clots from forming.  Electrical cardioversion. This is a procedure that resets your heart's rhythm by delivering a controlled, low-energy shock to the heart through your skin.  Different types of ablation, such as catheter ablation, catheter ablation with pacemaker, or surgical ablation. These procedures destroy the heart tissues that send abnormal signals. When the pacemaker is used, it is placed under your skin to help your heart beat in   a regular rhythm.  Follow these instructions at home:  Take over-the counter and prescription medicines only as told by your  health care provider.  If your health care provider prescribed a blood-thinning medicine (anticoagulant), take it exactly as told. Taking too much blood-thinning medicine can cause bleeding. If you do not take enough blood-thinning medicine, you will not have the protection that you need against stroke and other problems.  Do not use tobacco products, including cigarettes, chewing tobacco, and e-cigarettes. If you need help quitting, ask your health care provider.  If you have obstructive sleep apnea, manage your condition as told by your health care provider.  Do not drink alcohol.  Do not drink beverages that contain caffeine, such as coffee, soda, and tea.  Maintain a healthy weight. Do not use diet pills unless your health care provider approves. Diet pills may make heart problems worse.  Follow diet instructions as told by your health care provider.  Exercise regularly as told by your health care provider.  Keep all follow-up visits as told by your health care provider. This is important. How is this prevented?  Avoid drinking beverages that contain caffeine or alcohol.  Avoid certain medicines, especially medicines that are used for breathing problems.  Avoid certain herbs and herbal medicines, such as those that contain ephedra or ginseng.  Do not use illegal drugs, such as cocaine and amphetamines.  Do not smoke.  Manage your high blood pressure. Contact a health care provider if:  You notice a change in the rate, rhythm, or strength of your heartbeat.  You are taking an anticoagulant and you notice increased bruising.  You tire more easily when you exercise or exert yourself. Get help right away if:  You have chest pain, abdominal pain, sweating, or weakness.  You feel nauseous.  You notice blood in your vomit, bowel movement, or urine.  You have shortness of breath.  You suddenly have swollen feet and ankles.  You feel dizzy.  You have sudden weakness or  numbness of the face, arm, or leg, especially on one side of the body.  You have trouble speaking, trouble understanding, or both (aphasia).  Your face or your eyelid droops on one side. These symptoms may represent a serious problem that is an emergency. Do not wait to see if the symptoms will go away. Get medical help right away. Call your local emergency services (911 in the [place] Do not drive yourself to the hospital. This information is not intended to replace advice given to you by your health care provider. Make sure you discuss any questions you have with your health care provider. Document Released: 10/30/2005 Document Revised: 03/08/2016 Document Reviewed: 02/24/2015 Elsevier Interactive Patient Education  2017 Elsevier Inc.  

## 2016-11-23 NOTE — Progress Notes (Signed)
Subjective:  Patient ID: Richard Blake, male    DOB: [birth year]  Age: 71 y.o. MRN: [id number]  CC: Palpitations   HPI [name] presents for the complaint of palpitations with exertion for the last 6 weeks. Prior to that, for the last year, he has had ankle edema. He is had intermittent episodes of SOB and DOE. He has had a mild nonproductive cough for the last few days with nasal congestion but he denies hemoptysis, chest pain, fever, chills, or night sweats. He is also concerned about his hiatal hernia because he has had a few episodes of pressure in his upper abdomen.  Outpatient Medications Prior to Visit  Medication Sig Dispense Refill  . Dietary Management Product (FOSTEUM PLUS) CAPS Take 1 capsule by mouth 2 (two) times daily. 60 capsule 11  . famotidine (PEPCID) 20 MG tablet Take 20 mg by mouth at bedtime.      . hydrOXYzine (ATARAX/VISTARIL) 10 MG tablet Take 1 tablet (10 mg total) by mouth 3 (three) times daily as needed. 30 tablet 0  . losartan (COZAAR) 100 MG tablet Take 1 tablet (100 mg total) by mouth daily. 90 tablet 3  . rosuvastatin (CRESTOR) 10 MG tablet Take 1 tablet (10 mg total) by mouth daily. 90 tablet 3  . vitamin B-12 (CYANOCOBALAMIN) 250 MCG tablet Take 250 mcg by mouth daily.      . Vitamin D, Ergocalciferol, (DRISDOL) 50000 units CAPS capsule 1 tab, twice a week (Patient taking differently: Take 50,000 Units by mouth every 7 (seven) days. 1 tab, twice a week) 12 capsule 3  . acetaminophen (TYLENOL) 500 MG tablet Take 500 mg by mouth daily.      Marland Kitchen amLODipine (NORVASC) 10 MG tablet Take 1 tablet (10 mg total) by mouth daily. 90 tablet 3  . 0.9 %  sodium chloride infusion      No facility-administered medications prior to visit.     ROS Review of Systems  Constitutional: Negative for activity change, appetite change, chills, diaphoresis, fatigue and unexpected weight change.  HENT: Positive for congestion. Negative for sore throat, trouble swallowing  and voice change.   Eyes: Negative for visual disturbance.  Respiratory: Positive for cough and shortness of breath. Negative for apnea, choking, chest tightness and stridor.   Cardiovascular: Positive for palpitations and leg swelling. Negative for chest pain.  Gastrointestinal: Negative for abdominal pain, blood in stool, constipation, diarrhea, nausea and vomiting.  Endocrine: Negative for cold intolerance and heat intolerance.  Genitourinary: Negative.  Negative for difficulty urinating, dysuria, frequency and urgency.  Musculoskeletal: Negative for back pain, myalgias and neck pain.  Skin: Negative.   Neurological: Negative.  Negative for dizziness, syncope, weakness, light-headedness and numbness.  Hematological: Negative for adenopathy. Does not bruise/bleed easily.    Objective:  BP 128/70 (BP Location: Left Arm, Patient Position: Sitting, Cuff Size: Normal)   Pulse 71   Temp 98.2 F (36.8 C) (Oral)   Resp 16   Ht 5\' 5"  (1.651 m)   Wt 192 lb (87.1 kg)   SpO2 97%   BMI 31.95 kg/m   BP Readings from Last 3 Encounters:  11/23/16 128/70  07/10/16 116/64  04/20/16 140/82    Wt Readings from Last 3 Encounters:  11/23/16 192 lb (87.1 kg)  07/10/16 178 lb (80.7 kg)  06/28/16 178 lb (80.7 kg)    Physical Exam  Constitutional: He is oriented to person, place, and time. No distress.  HENT:  Mouth/Throat: Oropharynx is clear and moist.  No oropharyngeal exudate.  Eyes: Conjunctivae are normal. Right eye exhibits no discharge. Left eye exhibits no discharge. No scleral icterus.  Neck: Normal range of motion. Neck supple. No JVD present. No tracheal deviation present. No thyromegaly present.  Cardiovascular: Normal rate, S1 normal and S2 normal.  An irregularly irregular rhythm present. Exam reveals no gallop.   No murmur heard. EKG ---  Atrial fibrillation  ABNORMAL RHYTHM   Pulmonary/Chest: Effort normal and breath sounds normal. No stridor. No respiratory distress. He  has no wheezes. He has no rales. He exhibits no tenderness.  Abdominal: Soft. Bowel sounds are normal. He exhibits no distension and no mass. There is no tenderness. There is no rebound and no guarding.  Musculoskeletal: Normal range of motion. He exhibits edema (2+ pitting edema in BLE). He exhibits no tenderness or deformity.  Lymphadenopathy:    He has no cervical adenopathy.  Neurological: He is oriented to person, place, and time.  Skin: Skin is warm and dry. No rash noted. He is not diaphoretic. No erythema. No pallor.  Vitals reviewed.   Lab Results  Component Value Date   WBC 5.0 02/23/2016   HGB 13.5 02/23/2016   HCT 40.8 02/23/2016   PLT 190.0 02/23/2016   GLUCOSE 96 02/23/2016   CHOL 169 01/19/2016   TRIG 79.0 01/19/2016   HDL 66.70 01/19/2016   LDLDIRECT 186.5 10/23/2012   LDLCALC 86 01/19/2016   ALT 20 02/23/2016   AST 24 02/23/2016   NA 144 02/23/2016   K 4.2 02/23/2016   CL 108 02/23/2016   CREATININE 1.50 02/23/2016   BUN 17 02/23/2016   CO2 29 02/23/2016   TSH 0.72 11/02/2015   PSA 3.67 02/23/2016    Dg Chest 2 View  Result Date: 04/20/2016 CLINICAL DATA:  Cough for 1 month.  History of sarcoid. EXAM: CHEST  2 VIEW COMPARISON:  January 12, 2015 FINDINGS: The heart size and mediastinal contours are stable. Bilateral hilum are stable consistent with patient's known sarcoid. There is mild scarring of bilateral apices unchanged. There is no focal infiltrate, pulmonary edema, or pleural effusion. There is a large hiatal hernia. The visualized skeletal structures are unremarkable. IMPRESSION: No active cardiopulmonary disease. Chronic changes stable compared to prior chest x-ray of March 2016. Electronically Signed   By: Abelardo Diesel M.D.   On: 04/20/2016 15:13    Assessment & Plan:   Donaldson was seen today for palpitations.  Diagnoses and all orders for this visit:  Cough- his chest x-ray is unremarkable, his cough is consistent with a viral upper respiratory  infection and does not need any treatment at this time. -     DG Abd Acute W/Chest; Future  Essential hypertension -     Comprehensive metabolic panel; Future -     Urinalysis, Routine w reflex microscopic; Future  DOE (dyspnea on exertion)- his EKG shows new onset atrial fibrillation, troponin is negative for any concerns for ischemia, will refer to cardiology to consider having an echo done to see what his ejection fraction is. -     CBC with Differential/Platelet; Future -     Troponin I; Future -     Brain natriuretic peptide; Future -     EKG 12-Lead -     D-dimer, quantitative (not at [facility]); Future -     Ambulatory referral to Cardiology  Localized edema- it sounds like his edema predated the onset of atrial fibrillation so for now I've asked him to stop amlodipine as it may  be contributing to the edema. He has new onset atrial fibrillation and a slightly elevated BNP so I have asked him to follow up with cardiology to see if he has developed heart failure. I don't see any metabolic causes for edema at this time such as thyroid disease or nephrotic syndrome. -     Thyroid Panel With TSH; Future -     Troponin I; Future -     Brain natriuretic peptide; Future -     D-dimer, quantitative (not at [facility]); Future -     Ambulatory referral to Cardiology  Hiatal hernia- x-ray shows a large hiatal hernia, I've asked him whether not he wants to see a surgeon to see if these needs to be surgically remedied. -     DG Abd Acute W/Chest; Future  Persistent atrial fibrillation ([place])- he has new onset atrial fibrillation with a controlled rate, slightly elevated BNP, and a slight decrease in his renal function. I gave him samples of Xarelto at the 20 mg dose for the next 20 days but when he returns I will lower his dose to 15 mg a day since his creatinine clearance is drop below 50. At this time, I do not see any secondary causes for atrial fibrillation such as DVT/PE/thyroid disease/valvular disease.  I have asked him to see cardiology for further workup and to discuss additional treatment options. -     D-dimer, quantitative (not at [facility]); Future -     Ambulatory referral to Cardiology -     rivaroxaban (XARELTO) 20 MG TABS tablet; Take 1 tablet (20 mg total) by mouth daily with supper.   I have discontinued Mr. Hemric's acetaminophen and amLODipine. I am also having him start on rivaroxaban. Additionally, I am having him maintain his vitamin B-12, famotidine, losartan, rosuvastatin, FOSTEUM PLUS, Vitamin D (Ergocalciferol), and hydrOXYzine. We will stop administering sodium chloride.  Meds ordered this encounter  Medications  . rivaroxaban (XARELTO) 20 MG TABS tablet    Sig: Take 1 tablet (20 mg total) by mouth daily with supper.    Dispense:  20 tablet    Refill:  0     Follow-up: Return in about 3 weeks (around 12/14/2016).  Scarlette Calico, MD

## 2016-11-24 ENCOUNTER — Encounter: Payer: Self-pay | Admitting: Internal Medicine

## 2016-11-24 LAB — D-DIMER, QUANTITATIVE: D-Dimer, Quant: 0.76 mcg/mL FEU — ABNORMAL HIGH (ref <0.50)

## 2016-11-24 LAB — THYROID PANEL WITH TSH
Free Thyroxine Index: 2.7 (ref 1.4–3.8)
T3 UPTAKE: 39 % — AB (ref 22–35)
T4 TOTAL: 6.9 ug/dL (ref 4.5–12.0)
TSH: 0.76 mIU/L (ref 0.40–4.50)

## 2016-11-26 ENCOUNTER — Other Ambulatory Visit: Payer: Self-pay | Admitting: Internal Medicine

## 2016-11-26 DIAGNOSIS — K449 Diaphragmatic hernia without obstruction or gangrene: Secondary | ICD-10-CM

## 2016-11-27 NOTE — Progress Notes (Signed)
Cardiology Office Note    Date:  11/28/2016   ID:  Richard Blake, DOB 03-Jul-1946, MRN [id number]  PCP:  Scarlette Calico, MD  Cardiologist:  New  Chief Complaint: Afib  History of Present Illness:   Richard Blake is a 71 y.o. male HTN, HLD and sarcoidosis who seen by PCP 11/23/16 for few weeks hx of palpitation, chronic dyspnea and Le edema. He found to have Afib and started on Xarelto 37m. Due to reduced renal function plan to decrease dose to 12mqd after 20 days of treatment.   BNP of 281. Hgb 11.6. Scr 1.5. Normal TSH with minimally elevated T3.  Elevated D-dimer to 0.76 (PCP does not seems pt need further evaluation).   Here today for further evaluation. Patient has noted fatigue and tiredness with lower extremity edema and dyspnea for the past 4 weeks after he had a cold symptoms. This has been intermittent and getting worse. Patient has a long-standing history of palpitations since childhood. His palpitation occur once every few months and last for few minutes and resolved by itself. Never have been evaluated for this. Patient had a irregular heart beat after spinal biopsy procedure in 1996. Unable to provide further information. Never drinker or smoker. His blood pressure has been normal at home. During palpitations episodes his heart race goes in 100. He denies any exertional chest pain. He does push mower to cut grass without any chest pain or shortness of breath. He has a chronic large hiatal hernia and that cause him to have a chest pressure/fullness when he lays down. No orthopnea, PND, syncope, dizziness, melena, blood in his stool or urine.  Paternal grandmother had a heart failure and stroke. Uncle had MI and stroke. Father, mother, Brother and sisters has hypertension and hyperlipidemia.   Past Medical History:  Diagnosis Date  . Anemia    Microcytic with Fe Sat 4% 10/31/10  . Atypical chest pain   . Cataract    removed bilaterally  . Chronic rhinitis   . Cough     . Diverticulosis   . GERD (gastroesophageal reflux disease)   . Hiatal hernia   . Hiatal hernia   . HLD (hyperlipidemia)   . HTN (hypertension)   . Interstitial lung disease ([place]  . Kidney stones   . Osteoarthritis   . [place] ([place]   Transbronchial biopsy, taking daily prednisone since  November 08 on and off since 1999 w/cough every time he relapses off it for more than several weeks  . 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 CATARACT EXTRACTION Left 10/14/2013  . CATARACT EXTRACTION Right 11/24/2013  . CERVICAL FUSION     pt states he did not have a cervical fusion, just a biopsy   . COLONOSCOPY  01/12/2006   tics only   . INGUINAL HERNIA REPAIR     right  . LIPOMA EXCISION Left 09/16/2004   forearm  . spinal cord biopsyOther]  1996   sarcoid    Current Medications: Prior to Admission medications   Medication Sig Start Date End Date Taking? Authorizing Provider  Dietary Management Product (FOSTEUM PLUS) CAPS Take 1 capsule by mouth 2 (two) times daily. 01/19/16   ThJanith LimaMD  famotidine (PEPCID) 20 MG tablet Take 20 mg by mouth at bedtime.      Historical Provider, MD  hydrOXYzine (ATARAX/VISTARIL) 10 MG tablet Take 1  tablet (10 mg total) by mouth 3 (three) times daily as needed. 02/23/16   Janith Lima, MD  losartan (COZAAR) 100 MG tablet Take 1 tablet (100 mg total) by mouth daily. 11/02/15   Janith Lima, MD  rivaroxaban (XARELTO) 20 MG TABS tablet Take 1 tablet (20 mg total) by mouth daily with supper. 11/23/16   Janith Lima, MD  rosuvastatin (CRESTOR) 10 MG tablet Take 1 tablet (10 mg total) by mouth daily. 11/02/15   Janith Lima, MD  vitamin B-12 (CYANOCOBALAMIN) 250 MCG tablet Take 250 mcg by mouth daily.      Historical Provider, MD  Vitamin D, Ergocalciferol, (DRISDOL) 50000 units CAPS capsule 1 tab, twice a week Patient taking differently: Take  50,000 Units by mouth every 7 (seven) days. 1 tab, twice a week 02/23/16   Janith Lima, MD    Allergies:   Amlodipine; Welchol [colesevelam hcl]; Other; and Prolia [denosumab]   Social History   Social History  . Marital status: Single    Spouse name: N/A  . Number of children: 0  . Years of education: N/A   Occupational History  . Retired [employer]  . Smoking status: Never Smoker  . Smokeless tobacco: Never Used  . Alcohol use No  . Drug use: No  . Sexual activity: Not Currently   Other Topics Concern  . None   Social History Narrative   Regular Exercise -  NO     Family History:  The patient's family history includes Arthritis in his paternal grandmother; Asthma in his mother; Colon polyps in his sister; Diabetes in his mother; Heart disease in his paternal grandmother and paternal uncle; Hypertension in his father; Kidney disease in his father and sister; Multiple myeloma in his sister; Prostate cancer in his father.   ROS:   Please see the history of present illness.    ROS All other systems reviewed and are negative.   PHYSICAL EXAM:   VS:  BP (!) 140/100 (BP Location: Left Arm, Patient Position: Sitting, Cuff Size: Normal)   Pulse 76   Ht '5\' 5"'  (1.651 m)   Wt 191 lb (86.6 kg)   BMI 31.78 kg/m    GEN: Well nourished, well developed, in no acute distress  HEENT: normal  Neck: no JVD, carotid bruits, or masses Cardiac: RRR; no murmurs, rubs, or gallops, 1+ BL LE edema  Respiratory:  clear to auscultation bilaterally, normal work of breathing GI: soft, nontender, nondistended, + BS MS: no deformity or atrophy  Skin: warm and dry, no rash Neuro:  Alert and Oriented x 3, Strength and sensation are intact Psych: euthymic mood, full affect  Wt Readings from Last 3 Encounters:  11/28/16 191 lb (86.6 kg)  11/23/16 192 lb (87.1 kg)  07/10/16 178 lb (80.7 kg)      Studies/Labs Reviewed:   EKG:  EKG is ordered today.  The  ekg ordered today demonstrates Afib at rate of 91 bpm.   Recent Labs: 11/23/2016: ALT 21; BUN 17; Creatinine, Ser 1.51; Hemoglobin 11.6; Platelets 183.0; Potassium 4.0; Pro B Natriuretic peptide (BNP) 281.0; Sodium 143; TSH 0.76   Lipid Panel    Component Value Date/Time   CHOL 169 01/19/2016 1358   TRIG 79.0 01/19/2016 1358   HDL 66.70 01/19/2016 1358   CHOLHDL 3 01/19/2016 1358   VLDL 15.8 01/19/2016 1358   LDLCALC 86 01/19/2016 1358   LDLDIRECT 186.5 10/23/2012 1450    Additional  studies/ records that were reviewed today include:   As above   ASSESSMENT & PLAN:    1. Persistant afb - Reasonable rate control. Discussed with DOD Dr. Lovena Le. Start BB. Get Echo. CHADSVASc score of 2 for HTN and age. Continue Xarelto for anticoagulation. F/u with Dr. Lovena Le in 3 weeks. If still in afib, likely DCCV. His dyspnea and edema is likely due to afib.  2. HTN - Stable and well controlled on current medications.   3. HLD - 01/19/2016: Cholesterol 169; HDL 66.70; LDL Cholesterol 86; Triglycerides 79.0; VLDL 15.8  - continue Crestor. Follow by PCP.   4. Large hiatal hernia - Has f/u with GI next week.   5. Elevated D-dimer - Per PCP    Medication Adjustments/Labs and Tests Ordered: Current medicines are reviewed at length with the patient today.  Concerns regarding medicines are outlined above.  Medication changes, Labs and Tests ordered today are listed in the Patient Instructions below. Patient Instructions  Medication Instructions:   START TAKING METOPROLOL TARTRATE 25 MG TWICE DAILY    Testing/Procedures:  Your physician has requested that you have an echocardiogram. Echocardiography is a painless test that uses sound waves to create images of your heart. It provides your doctor with information about the size and shape of your heart and how well your heart's chambers and valves are working. This procedure takes approximately one hour. There are no restrictions for this  procedure.    Follow-Up:  3 WEEKS WITH DR Lovena Le AS A NEW PATIENT       If you need a refill on your cardiac medications before your next appointment, please call your pharmacy.      Jarrett Soho, [place]  11/28/2016 1:09 PM    [facility] [address], [place], [place]  [address] Phone: [phone]; Fax: [phone]

## 2016-11-28 ENCOUNTER — Ambulatory Visit (HOSPITAL_COMMUNITY): Payer: Medicare Other | Attending: Cardiology

## 2016-11-28 ENCOUNTER — Encounter: Payer: Self-pay | Admitting: Physician Assistant

## 2016-11-28 ENCOUNTER — Other Ambulatory Visit: Payer: Self-pay

## 2016-11-28 ENCOUNTER — Ambulatory Visit (INDEPENDENT_AMBULATORY_CARE_PROVIDER_SITE_OTHER): Payer: Medicare Other | Admitting: Physician Assistant

## 2016-11-28 VITALS — BP 140/100 | HR 76 | Ht 65.0 in | Wt 191.0 lb

## 2016-11-28 DIAGNOSIS — E7849 Other hyperlipidemia: Secondary | ICD-10-CM

## 2016-11-28 DIAGNOSIS — K449 Diaphragmatic hernia without obstruction or gangrene: Secondary | ICD-10-CM

## 2016-11-28 DIAGNOSIS — R7989 Other specified abnormal findings of blood chemistry: Secondary | ICD-10-CM

## 2016-11-28 DIAGNOSIS — I4819 Other persistent atrial fibrillation: Secondary | ICD-10-CM

## 2016-11-28 DIAGNOSIS — E784 Other hyperlipidemia: Secondary | ICD-10-CM

## 2016-11-28 DIAGNOSIS — I10 Essential (primary) hypertension: Secondary | ICD-10-CM

## 2016-11-28 DIAGNOSIS — I4891 Unspecified atrial fibrillation: Secondary | ICD-10-CM | POA: Diagnosis present

## 2016-11-28 DIAGNOSIS — I481 Persistent atrial fibrillation: Secondary | ICD-10-CM

## 2016-11-28 DIAGNOSIS — I083 Combined rheumatic disorders of mitral, aortic and tricuspid valves: Secondary | ICD-10-CM | POA: Insufficient documentation

## 2016-11-28 LAB — ECHOCARDIOGRAM COMPLETE
Height: 65 in
Weight: 3056 oz

## 2016-11-28 MED ORDER — METOPROLOL TARTRATE 25 MG PO TABS: 25.0000 mg | ORAL_TABLET | Freq: Two times a day (BID) | ORAL | 3 refills | Status: DC

## 2016-11-28 NOTE — Patient Instructions (Signed)
Medication Instructions:   START TAKING METOPROLOL TARTRATE 25 MG TWICE DAILY    Testing/Procedures:  Your physician has requested that you have an echocardiogram. Echocardiography is a painless test that uses sound waves to create images of your heart. It provides your doctor with information about the size and shape of your heart and how well your heart's chambers and valves are working. This procedure takes approximately one hour. There are no restrictions for this procedure.    Follow-Up:  3 WEEKS WITH DR Lovena Le AS A NEW PATIENT       If you need a refill on your cardiac medications before your next appointment, please call your pharmacy.

## 2016-12-05 ENCOUNTER — Encounter: Payer: Self-pay | Admitting: Physician Assistant

## 2016-12-05 ENCOUNTER — Ambulatory Visit (INDEPENDENT_AMBULATORY_CARE_PROVIDER_SITE_OTHER): Payer: Medicare Other | Admitting: Physician Assistant

## 2016-12-05 ENCOUNTER — Telehealth: Payer: Self-pay | Admitting: Emergency Medicine

## 2016-12-05 VITALS — BP 134/80 | Ht 65.0 in | Wt 191.4 lb

## 2016-12-05 DIAGNOSIS — R1013 Epigastric pain: Secondary | ICD-10-CM | POA: Diagnosis not present

## 2016-12-05 DIAGNOSIS — R140 Abdominal distension (gaseous): Secondary | ICD-10-CM | POA: Diagnosis not present

## 2016-12-05 DIAGNOSIS — Z7901 Long term (current) use of anticoagulants: Secondary | ICD-10-CM

## 2016-12-05 DIAGNOSIS — K449 Diaphragmatic hernia without obstruction or gangrene: Secondary | ICD-10-CM

## 2016-12-05 DIAGNOSIS — K219 Gastro-esophageal reflux disease without esophagitis: Secondary | ICD-10-CM | POA: Diagnosis not present

## 2016-12-05 NOTE — Progress Notes (Signed)
I agree with the above note, plan 

## 2016-12-05 NOTE — Progress Notes (Signed)
Chief Complaint: Hiatal hernia, Bloating, Epigastric pressure  HPI:  Mr. Stidd is a 71 year old African-American male with a past medical history of recently diagnosed A. fib, currently on Xarelto, anemia, diverticulosis, GERD, hyperlipidemia, hypertension, interstitial lung disease, sarcoidosis and others listed below, who was referred to me by Janith Lima, MD for a complaint of hiatal hernia .     Patient was seen by Dr. Ardis Hughs for the same reason on 03/30/15, but at that time he was not having any symptoms or discomfort from his hiatal hernia and further evaluation/treatment was delayed.   Patient did have recent repeat abdominal x-ray on 11/23/16 showing nonobstructive bowel gas pattern, large hiatal hernia, chronic interstitial lung changes and a mildly enlarged cardiac silhouette which was stable.   Today, the patient explains that in December he started waking up feeling a "pressure on my abdomen/chest and up into my diaphragm". The patient tells me this has been occurring everyday since that time. He also experiences this occasionally throughout the day and believes it is related to his hiatal hernia. He continues to use Pepcid when necessary for reflux but denies any increase in this symptom. He does tell me that he has been getting more bloating after eating meals and discomfort. He would like his hiatal hernia evaluated further.   Patient also tells me that recently he was diagnosed with A. fib and started on Xarelto 2 weeks ago, he has a consult with the cardiologist, Dr. Lovena Le tomorrow to discuss long-term anticoagulation and other therapies. He is aware that he may have to stop this medication if we need to do any procedures.   Patient denies fever, chills, blood in stool, melena, weight loss, fatigue, anorexia, nausea, vomiting, change in bowel habits or constant abdominal pain.   Past Medical History:  Diagnosis Date  . A-fib ([place]) 11/2016   Tx with po meds at this time  .  Anemia    Microcytic with Fe Sat 4% 10/31/10  . Atypical chest pain   . Cataract    removed bilaterally  . Chronic rhinitis   . Cough   . Diverticulosis   . GERD (gastroesophageal reflux disease)   . Hiatal hernia   . Hiatal hernia   . HLD (hyperlipidemia)   . HTN (hypertension)   . Interstitial lung disease ([place])   . Kidney stones   . Osteoarthritis   . Sarcoidosis ([place]) 8/99   Transbronchial biopsy, taking daily prednisone since  November 08 on and off since 1999 w/cough every time he relapses off it for more than several weeks  . 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 CATARACT EXTRACTION Left 10/14/2013  . CATARACT EXTRACTION Right 11/24/2013  . CERVICAL FUSION     pt states he did not have a cervical fusion, just a biopsy   . COLONOSCOPY  01/12/2006   tics only   . INGUINAL HERNIA REPAIR     right  . LIPOMA EXCISION Left 09/16/2004   forearm  . spinal cord biopsyOther]  1996   sarcoid    Current Outpatient Prescriptions  Medication Sig Dispense Refill  . Dietary Management Product (FOSTEUM PLUS) CAPS Take 1 capsule by mouth 2 (two) times daily. 60 capsule 11  . famotidine (PEPCID) 20 MG tablet Take 20 mg by mouth at bedtime.      . hydrOXYzine (ATARAX/VISTARIL) 10 MG tablet Take 1 tablet (10 mg total) by  mouth 3 (three) times daily as needed. 30 tablet 0  . losartan (COZAAR) 100 MG tablet Take 1 tablet (100 mg total) by mouth daily. 90 tablet 3  . metoprolol tartrate (LOPRESSOR) 25 MG tablet Take 1 tablet (25 mg total) by mouth 2 (two) times daily. 180 tablet 3  . rivaroxaban (XARELTO) 20 MG TABS tablet Take 1 tablet (20 mg total) by mouth daily with supper. 20 tablet 0  . rosuvastatin (CRESTOR) 10 MG tablet Take 1 tablet (10 mg total) by mouth daily. 90 tablet 3  . vitamin B-12 (CYANOCOBALAMIN) 250 MCG tablet Take 250 mcg by mouth daily.      . Vitamin D, Ergocalciferol,  (DRISDOL) 50000 units CAPS capsule 1 tab, twice a week (Patient taking differently: Take 50,000 Units by mouth every 7 (seven) days. 1 tab, twice a week) 12 capsule 3   No current facility-administered medications for this visit.     Allergies as of 12/05/2016 - Review Complete 12/05/2016  Allergen Reaction Noted  . Amlodipine Other (See Comments) 11/23/2016  . Welchol [colesevelam hcl]  08/28/2013  . Other  06/28/2016  . Prolia [denosumab]  01/22/2013    Family History  Problem Relation Age of Onset  . Asthma Mother   . Diabetes Mother   . Arthritis Paternal Grandmother   . Heart disease Paternal Grandmother   . Hypertension Father     Fathers family most  . Prostate cancer Father   . Kidney disease Father   . Colon polyps Sister   . Multiple myeloma Sister   . Heart disease Paternal Uncle   . Kidney disease Sister   . Sarcoidosis Neg Hx   . Other Neg Hx     osteoporosis  . Colon cancer Neg Hx   . Esophageal cancer Neg Hx   . Rectal cancer Neg Hx   . Stomach cancer Neg Hx     Social History   Social History  . Marital status: Single    Spouse name: N/A  . Number of children: 0  . Years of education: N/A   Occupational History  . Retired [employer]  . Smoking status: Never Smoker  . Smokeless tobacco: Never Used  . Alcohol use No  . Drug use: No  . Sexual activity: Not Currently   Other Topics Concern  . Not on file   Social History Narrative   Regular Exercise -  NO    Review of Systems:    Constitutional: No weight loss, fever, chills, weakness or fatigue Skin: No rash Cardiovascular: No chest pain Respiratory: No SOB Gastrointestinal: See HPI and otherwise negative Genitourinary: No dysuria or change in urinary frequency Neurological: No headache Musculoskeletal: No new muscle or joint pain Hematologic: No bleeding  Psychiatric: No history of depression or anxiety   Physical Exam:  Vital signs: BP 134/80    Ht 5' 5" (1.651 m)   Wt 191 lb 6 oz (86.8 kg)   BMI 31.85 kg/m   Constitutional:   Pleasant African-American male appears to be in NAD, Well developed, Well nourished, alert and cooperative Head:  Normocephalic and atraumatic. Eyes:   PEERL, EOMI. No icterus. Conjunctiva pink. Ears:  Normal auditory acuity. Neck:  Supple Throat: Oral cavity and pharynx without inflammation, swelling or lesion.  Respiratory: Respirations even and unlabored. Lungs clear to auscultation bilaterally.   No wheezes, crackles, or rhonchi.  Cardiovascular: Normal S1, S2. No MRG. Regular rate and rhythm. No peripheral edema, cyanosis or  pallor.  Gastrointestinal:  Soft, nondistended, nontender. No rebound or guarding. Normal bowel sounds. No appreciable masses or hepatomegaly. Rectal:  Not performed.  Msk:  Symmetrical without gross deformities. Without edema, no deformity or joint abnormality.  Neurologic:  Alert and  oriented x4;  grossly normal neurologically.  Skin:   Dry and intact without significant lesions or rashes. Psychiatric:  Demonstrates good judgement and reason without abnormal affect or behaviors.  MOST RECENT LABS AND IMAGING: CBC    Component Value Date/Time   WBC 5.5 11/23/2016 1410   RBC 4.43 11/23/2016 1410   HGB 11.6 (L) 11/23/2016 1410   HCT 35.8 (L) 11/23/2016 1410   PLT 183.0 11/23/2016 1410   MCV 80.8 11/23/2016 1410   MCHC 32.5 11/23/2016 1410   RDW 15.4 11/23/2016 1410   LYMPHSABS 0.8 11/23/2016 1410   MONOABS 0.5 11/23/2016 1410   EOSABS 0.2 11/23/2016 1410   BASOSABS 0.0 11/23/2016 1410    CMP     Component Value Date/Time   NA 143 11/23/2016 1410   K 4.0 11/23/2016 1410   CL 106 11/23/2016 1410   CO2 29 11/23/2016 1410   GLUCOSE 83 11/23/2016 1410   BUN 17 11/23/2016 1410   CREATININE 1.51 (H) 11/23/2016 1410   CALCIUM 9.5 11/23/2016 1410   PROT 7.3 11/23/2016 1410   ALBUMIN 4.0 11/23/2016 1410   AST 24 11/23/2016 1410   ALT 21 11/23/2016 1410   ALKPHOS  126 (H) 11/23/2016 1410   BILITOT 0.8 11/23/2016 1410   GFRNONAA 94.82 10/31/2010 1519   DG ABDOMEN ACUTE W/ 1V CHEST 11/23/16  COMPARISON:  Chest radiograph 04/20/2016  FINDINGS: There is no evidence of dilated bowel loops or free intraperitoneal air. No radiopaque calculi or other significant radiographic abnormality is seen.  Heart size is mildly enlarged, stable. Mediastinal contours are within normal limits. Chronic interstitial lung changes are also stable. No focal airspace consolidation.  There is a large hiatal hernia.  IMPRESSION: Nonobstructive bowel gas pattern.  Large hiatal hernia.  Chronic interstitial lung changes.  Mildly enlarged cardiac silhouette, stable.   Electronically Signed   By: Fidela Salisbury M.D.   On: 11/23/2016 14:41  Assessment: 1. Hiatal hernia: Patient has a known large hiatal hernia, had discussion with Dr. Ardis Hughs last year about further evaluation, at that time this was delayed, patient now with an increase in epigastric discomfort/pressure mainly at night while lying down as well as an increase in bloating after eating, he would like further evaluation at this time; Consider relation to 13 pound weight gain over the past 6 mos 2. Bloating: See above, consider relation to hiatal hernia versus gastritis versus other 3. GERD: Still very intermittent symptoms, patient uses Pepcid when necessary 4. Chronic anticoagulation: Patient started on Xarelto 2 weeks ago for newly diagnosed A. fib, has consultation with cardiology tomorrow  Plan: 1. Discussed with the patient that the next step in his evaluation would be an EGD as this has never been performed. We discussed risks, benefits, limitations and alternatives of the patient agrees to proceed. This was scheduled in the [facility] with Dr. Ardis Hughs. 2. Patient was advised to hold his Xarelto for 24 hours prior to the procedure. We will communicate with his cardiologist Dr. Lovena Le to ensure  that holding his Xarelto is acceptable. 3. Patient to continue his Pepcid when necessary for reflux 4. Delayed EGD for at least 2-3 months to allow for full workup of his newly diagnosed A. fib 5. Patient to follow in clinic per  Dr. Eugenia Pancoast recommendations after time of procedure  Ellouise Newer, PA-C [facility] 12/05/2016, 11:47 AM  Cc: Janith Lima, MD

## 2016-12-05 NOTE — Patient Instructions (Signed)
We will contact you to schedule your procedure.

## 2016-12-05 NOTE — Telephone Encounter (Signed)
Dr. Lovena Le,   This patient needs to be scheduled for a endoscopic procedure however he was recently placed on Xarelto for A fib. He has an appointment with you tomorrow 12/06/16. Please advise after his appointment if he is stable from a cardiac standpoint to come off Xarelto for his procedure. Route answer back to me, desiree Owens Shark, CMA.    Sincerely,   Tinnie Gens, [place]

## 2016-12-06 ENCOUNTER — Encounter: Payer: Self-pay | Admitting: Internal Medicine

## 2016-12-06 ENCOUNTER — Ambulatory Visit (INDEPENDENT_AMBULATORY_CARE_PROVIDER_SITE_OTHER): Payer: Medicare Other | Admitting: Internal Medicine

## 2016-12-06 VITALS — BP 146/98 | HR 74 | Ht 65.0 in | Wt 194.8 lb

## 2016-12-06 DIAGNOSIS — I481 Persistent atrial fibrillation: Secondary | ICD-10-CM

## 2016-12-06 DIAGNOSIS — I4819 Other persistent atrial fibrillation: Secondary | ICD-10-CM

## 2016-12-06 DIAGNOSIS — Z01812 Encounter for preprocedural laboratory examination: Secondary | ICD-10-CM | POA: Diagnosis not present

## 2016-12-06 MED ORDER — RIVAROXABAN 20 MG PO TABS: 20.0000 mg | ORAL_TABLET | Freq: Every day | ORAL | 6 refills | Status: DC

## 2016-12-06 NOTE — Patient Instructions (Addendum)
Medication Instructions:  Your physician recommends that you continue on your current medications as directed. Please refer to the Current Medication list given to you today.   Labwork: BMET / CBC w/ diff / PT/INR on 12/14/16  Testing/Procedures: Cardioversion  Follow-Up: Your physician recommends that you schedule a follow-up appointment in: 4-6 weeks with Dr. Lovena Le from 12/21/16   Any Other Special Instructions Will Be Listed Below ---   Report to the [facility] on 12/21/16 at 11:30 AM  Nothing to eat or drink after midnight the night before procedure  Day of procedure you may take your medications with a sip of water  You will need someone to drive you home after the procedure    If you need a refill on your cardiac medications before your next appointment, please call your pharmacy.

## 2016-12-06 NOTE — Progress Notes (Signed)
HPI Richard Blake is referred today for evaluation of atrial fib. He was discovered to have atrial fib by Dr. Ronnald Ramp and was seen in our office and is referred for EP evaluation. He does not feel palpitations but does note that over the past several weeks his energy level is reduced and he gets sob with exertion. No syncope. He has also noted mild peripheral edema. Allergies  Allergen Reactions  . Amlodipine Other (See Comments)    edema  . Welchol [Colesevelam Hcl]     Stomach cramps  . Other     Hair dye - facial swelling   . Prolia [Denosumab]     Pain at injection sight     Current Outpatient Prescriptions  Medication Sig Dispense Refill  . Dietary Management Product (FOSTEUM PLUS) CAPS Take 1 capsule by mouth 2 (two) times daily. 60 capsule 11  . famotidine (PEPCID) 20 MG tablet Take 20 mg by mouth at bedtime.      . hydrOXYzine (ATARAX/VISTARIL) 10 MG tablet Take 1 tablet (10 mg total) by mouth 3 (three) times daily as needed. 30 tablet 0  . losartan (COZAAR) 100 MG tablet Take 1 tablet (100 mg total) by mouth daily. 90 tablet 3  . metoprolol tartrate (LOPRESSOR) 25 MG tablet Take 1 tablet (25 mg total) by mouth 2 (two) times daily. 180 tablet 3  . rivaroxaban (XARELTO) 20 MG TABS tablet Take 1 tablet (20 mg total) by mouth daily with supper. 20 tablet 0  . rosuvastatin (CRESTOR) 10 MG tablet Take 1 tablet (10 mg total) by mouth daily. 90 tablet 3  . vitamin B-12 (CYANOCOBALAMIN) 250 MCG tablet Take 250 mcg by mouth daily.      . Vitamin D, Ergocalciferol, (DRISDOL) 50000 units CAPS capsule 1 tab, twice a week (Patient taking differently: Take 50,000 Units by mouth every 7 (seven) days. 1 tab, twice a week) 12 capsule 3   No current facility-administered medications for this visit.      Past Medical History:  Diagnosis Date  . A-fib ([place]) 11/2016   Tx with po meds at this time  . Anemia    Microcytic with Fe Sat 4% 10/31/10  . Atypical chest pain   . Cataract    removed bilaterally  . Chronic rhinitis   . Cough   . Diverticulosis   . GERD (gastroesophageal reflux disease)   . Hiatal hernia   . Hiatal hernia   . HLD (hyperlipidemia)   . HTN (hypertension)   . Interstitial lung disease ([place])   . Kidney stones   . Osteoarthritis   . Sarcoidosis ([place]) 8/99   Transbronchial biopsy, taking daily prednisone since  November 08 on and off since 1999 w/cough every time he relapses off it for more than several weeks  . 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   Past Surgical History:  Procedure Laterality Date  . CATARACT EXTRACTION Left 10/14/2013  . CATARACT EXTRACTION Right 11/24/2013  . CERVICAL FUSION     pt states he did not have a cervical fusion, just a biopsy   . COLONOSCOPY  01/12/2006   tics only   . INGUINAL HERNIA REPAIR     right  . LIPOMA EXCISION Left 09/16/2004   forearm  . spinal cord biopsyOther]  1996   sarcoid     Family History  Problem  Relation Age of Onset  . Asthma Mother   . Diabetes Mother   . Arthritis Paternal Grandmother   . Heart disease Paternal Grandmother   . Hypertension Father     Fathers family most  . Prostate cancer Father   . Kidney disease Father   . Colon polyps Sister   . Multiple myeloma Sister   . Heart disease Paternal Uncle   . Kidney disease Sister   . Sarcoidosis Neg Hx   . Other Neg Hx     osteoporosis  . Colon cancer Neg Hx   . Esophageal cancer Neg Hx   . Rectal cancer Neg Hx   . Stomach cancer Neg Hx      Social History   Social History  . Marital status: Single    Spouse name: N/A  . Number of children: 0  . Years of education: N/A   Occupational History  . Retired [place] [employer]   Social History Main Topics  . Smoking status: Never Smoker  . Smokeless tobacco: Never Used  . Alcohol use No  . Drug use: No  . Sexual activity: Not Currently    Other Topics Concern  . Not on file   Social History Narrative   Regular Exercise -  NO     BP (!) 146/98   Pulse 74   Ht 5' 5" (1.651 m)   Wt 194 lb 12.8 oz (88.4 kg)   BMI 32.42 kg/m   Physical Exam:  Well appearing NAD HEENT: Unremarkable Neck:  No JVD, no thyromegally Lymphatics:  No adenopathy Back:  No CVA tenderness Lungs:  Clear HEART:  Regular rate rhythm, no murmurs, no rubs, no clicks Abd:  soft, positive bowel sounds, no organomegally, no rebound, no guarding Ext:  2 plus pulses, no edema, no cyanosis, no clubbing Skin:  No rashes no nodules Neuro:  CN II through XII intact, motor grossly intact  EKG - reviewed. Atrial fib with a CVR   Assess/Plan: 1. Atrial fib - while he does not have palpitations, I do think he is symptomatic. Will plan to proceed with DCCV 2. Coags - he has been on Xarelto since 1/11. He denies missing any doses. He is instructed not to miss any of his blood thinners. 3. HTN heart disease - his blood pressure is not well controlled. We discussed switching to coreg but for now will continue his metoprolol. 4. Sarcoidosis - it is unclear if he has cardiac involvement. He will undergo watchful waiting.  Gregg Taylor,M.D. 

## 2016-12-06 NOTE — Telephone Encounter (Signed)
He is undergoing DCCV in the next 2 weeks. He will not be able to stop his xarelto for approximately 6 weeks from now. GT

## 2016-12-07 ENCOUNTER — Other Ambulatory Visit: Payer: Self-pay | Admitting: *Deleted

## 2016-12-07 DIAGNOSIS — I4819 Other persistent atrial fibrillation: Secondary | ICD-10-CM

## 2016-12-07 MED ORDER — RIVAROXABAN 20 MG PO TABS: 20.0000 mg | ORAL_TABLET | Freq: Every day | ORAL | 5 refills | Status: DC

## 2016-12-07 NOTE — Telephone Encounter (Signed)
Left message on voicemail to call office.  

## 2016-12-07 NOTE — Telephone Encounter (Signed)
Spoke to patient and scheduled procedure for 02/06/17, He understands to stop his Xarelto for 2 days before his procedure.

## 2016-12-07 NOTE — Telephone Encounter (Signed)
Patient left a msg on the refill vm stating that he saw Dr Lovena Le yesterday and was told that an rx for xarelto would be sent to the pharmacy. When he went to the pharmacy, they informed the patient that they did not receive the rx.

## 2016-12-14 ENCOUNTER — Encounter: Payer: Self-pay | Admitting: Internal Medicine

## 2016-12-14 ENCOUNTER — Other Ambulatory Visit: Payer: Medicare Other | Admitting: *Deleted

## 2016-12-14 DIAGNOSIS — I4819 Other persistent atrial fibrillation: Secondary | ICD-10-CM

## 2016-12-14 DIAGNOSIS — E785 Hyperlipidemia, unspecified: Secondary | ICD-10-CM | POA: Diagnosis not present

## 2016-12-14 DIAGNOSIS — D869 Sarcoidosis, unspecified: Secondary | ICD-10-CM

## 2016-12-14 DIAGNOSIS — I481 Persistent atrial fibrillation: Secondary | ICD-10-CM | POA: Diagnosis not present

## 2016-12-14 NOTE — Addendum Note (Signed)
Addended by: Eulis Foster on: 12/14/2016 10:00 AM   Modules accepted: Orders

## 2016-12-15 LAB — CBC WITH DIFFERENTIAL/PLATELET
BASOS: 1 %
Basophils Absolute: 0 10*3/uL (ref 0.0–0.2)
EOS (ABSOLUTE): 0.2 10*3/uL (ref 0.0–0.4)
EOS: 4 %
HEMATOCRIT: 34 % — AB (ref 37.5–51.0)
Hemoglobin: 10.1 g/dL — ABNORMAL LOW (ref 13.0–17.7)
IMMATURE GRANS (ABS): 0 10*3/uL (ref 0.0–0.1)
IMMATURE GRANULOCYTES: 0 %
LYMPHS: 24 %
Lymphocytes Absolute: 1.1 10*3/uL (ref 0.7–3.1)
MCH: 24.8 pg — ABNORMAL LOW (ref 26.6–33.0)
MCHC: 29.7 g/dL — ABNORMAL LOW (ref 31.5–35.7)
MCV: 83 fL (ref 79–97)
MONOCYTES: 9 %
Monocytes Absolute: 0.4 10*3/uL (ref 0.1–0.9)
Neutrophils Absolute: 2.8 10*3/uL (ref 1.4–7.0)
Neutrophils: 62 %
PLATELETS: 298 10*3/uL (ref 150–379)
RBC: 4.08 x10E6/uL — AB (ref 4.14–5.80)
RDW: 15.5 % — ABNORMAL HIGH (ref 12.3–15.4)
WBC: 4.4 10*3/uL (ref 3.4–10.8)

## 2016-12-15 LAB — BASIC METABOLIC PANEL
BUN/Creatinine Ratio: 9 — ABNORMAL LOW (ref 10–24)
BUN: 15 mg/dL (ref 8–27)
CALCIUM: 9.2 mg/dL (ref 8.6–10.2)
CO2: 21 mmol/L (ref 18–29)
CREATININE: 1.64 mg/dL — AB (ref 0.76–1.27)
Chloride: 104 mmol/L (ref 96–106)
GFR calc Af Amer: 49 mL/min/{1.73_m2} — ABNORMAL LOW (ref >59)
GFR, EST NON AFRICAN AMERICAN: 42 mL/min/{1.73_m2} — AB (ref >59)
GLUCOSE: 97 mg/dL (ref 65–99)
Potassium: 4.2 mmol/L (ref 3.5–5.2)
Sodium: 142 mmol/L (ref 134–144)

## 2016-12-15 LAB — PROTIME-INR
INR: 1.1 (ref 0.8–1.2)
PROTHROMBIN TIME: 12 s (ref 9.1–12.0)

## 2016-12-16 IMAGING — CR DG CHEST 2V
2 series · 2 of 2 positions shown · non-contrast
Comparison: 10/24/2012

CLINICAL DATA: Postinflammatory pulmonary fibrosis.  Sarcoidosis.

EXAM:
CHEST  2 VIEW

[view not recorded (1 of 2)]
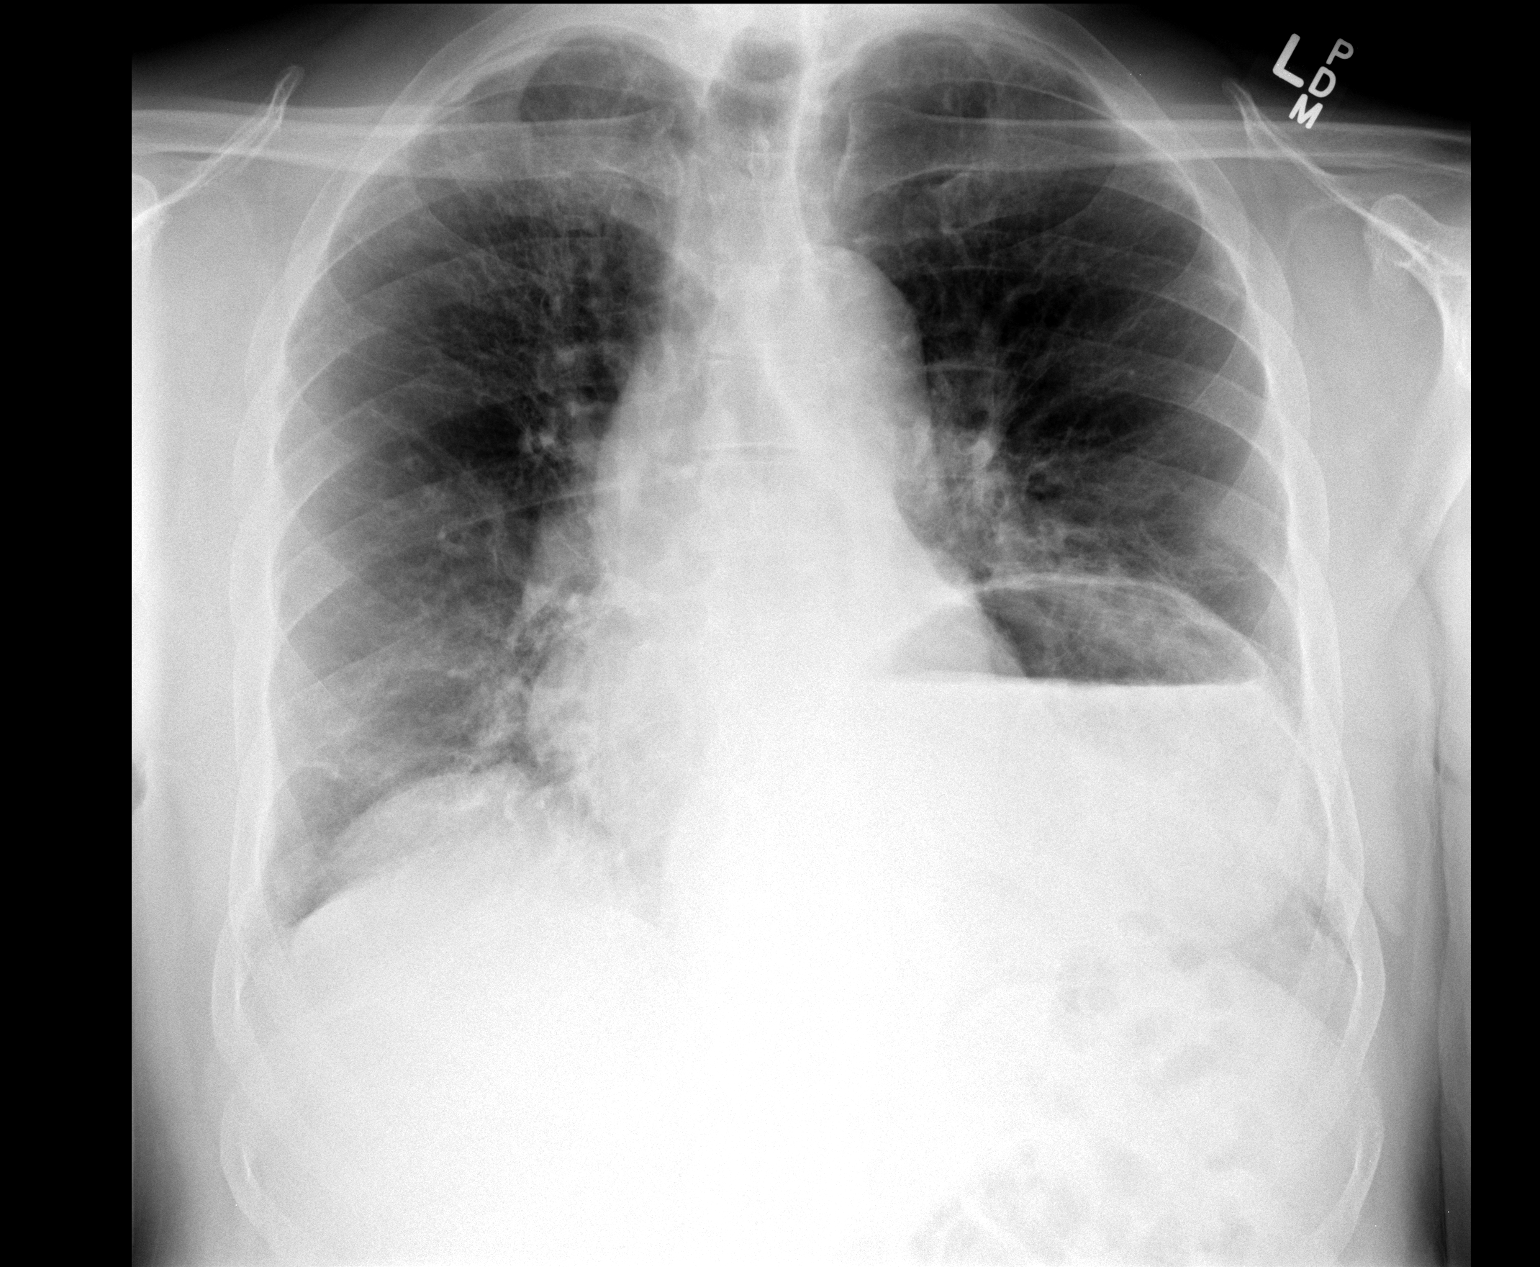

[view not recorded (2 of 2)]
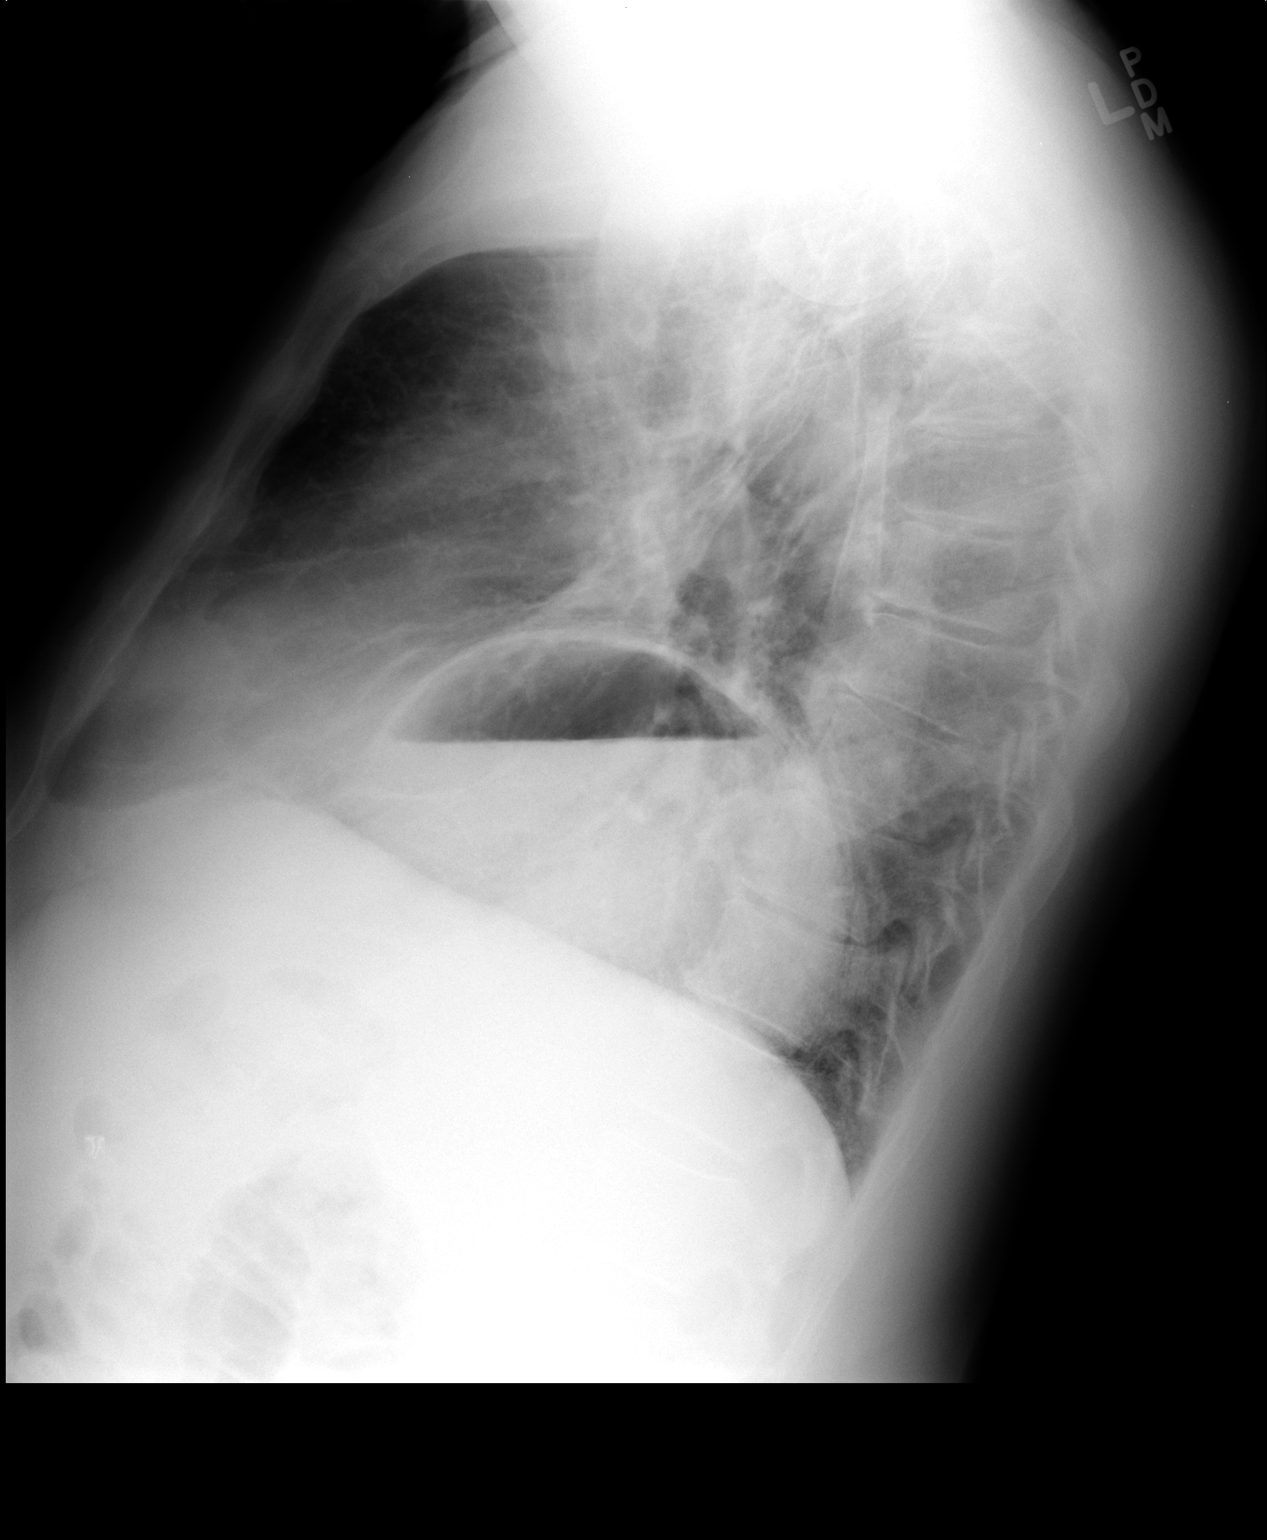

[2 of 2 positions shown; findings below may reference images not displayed]

FINDINGS: Large hiatal hernia has progressed in the interval. There is
compressive atelectasis in the left lower lobe which has progressed.

Mild hyperinflation. Negative for acute infiltrate or effusion.
Negative for heart failure.
IMPRESSION: Large hiatal hernia has progressed. Increase in left lower lobe
atelectasis.

## 2016-12-19 ENCOUNTER — Ambulatory Visit (INDEPENDENT_AMBULATORY_CARE_PROVIDER_SITE_OTHER): Payer: Medicare Other | Admitting: Internal Medicine

## 2016-12-19 ENCOUNTER — Other Ambulatory Visit (INDEPENDENT_AMBULATORY_CARE_PROVIDER_SITE_OTHER): Payer: Medicare Other

## 2016-12-19 ENCOUNTER — Encounter: Payer: Self-pay | Admitting: Internal Medicine

## 2016-12-19 VITALS — BP 140/84 | HR 77 | Resp 16 | Ht 65.0 in | Wt 199.0 lb

## 2016-12-19 DIAGNOSIS — I10 Essential (primary) hypertension: Secondary | ICD-10-CM | POA: Diagnosis not present

## 2016-12-19 DIAGNOSIS — D539 Nutritional anemia, unspecified: Secondary | ICD-10-CM

## 2016-12-19 DIAGNOSIS — R05 Cough: Secondary | ICD-10-CM

## 2016-12-19 DIAGNOSIS — I481 Persistent atrial fibrillation: Secondary | ICD-10-CM

## 2016-12-19 DIAGNOSIS — R059 Cough, unspecified: Secondary | ICD-10-CM

## 2016-12-19 DIAGNOSIS — D508 Other iron deficiency anemias: Secondary | ICD-10-CM

## 2016-12-19 DIAGNOSIS — I4819 Other persistent atrial fibrillation: Secondary | ICD-10-CM

## 2016-12-19 DIAGNOSIS — R600 Localized edema: Secondary | ICD-10-CM

## 2016-12-19 LAB — IBC PANEL
Iron: 9 ug/dL — ABNORMAL LOW (ref 42–165)
SATURATION RATIOS: 1.9 % — AB (ref 20.0–50.0)
TRANSFERRIN: 345 mg/dL (ref 212.0–360.0)

## 2016-12-19 LAB — BASIC METABOLIC PANEL
BUN: 23 mg/dL (ref 6–23)
CHLORIDE: 109 meq/L (ref 96–112)
CO2: 27 mEq/L (ref 19–32)
Calcium: 9.3 mg/dL (ref 8.4–10.5)
Creatinine, Ser: 1.84 mg/dL — ABNORMAL HIGH (ref 0.40–1.50)
GFR: 47.1 mL/min — AB (ref >60.00)
GLUCOSE: 88 mg/dL (ref 70–99)
POTASSIUM: 4 meq/L (ref 3.5–5.1)
SODIUM: 143 meq/L (ref 135–145)

## 2016-12-19 LAB — FOLATE: Folate: 9.1 ng/mL (ref >5.9)

## 2016-12-19 LAB — POCT EXHALED NITRIC OXIDE: FENO LEVEL (PPB): 28

## 2016-12-19 LAB — VITAMIN B12: VITAMIN B 12: 1066 pg/mL — AB (ref 211–911)

## 2016-12-19 LAB — FERRITIN: FERRITIN: 6.3 ng/mL — AB (ref 22.0–322.0)

## 2016-12-19 MED ORDER — FERROUS SULFATE 325 (65 FE) MG PO TABS: 325.0000 mg | ORAL_TABLET | Freq: Three times a day (TID) | ORAL | 4 refills | Status: DC

## 2016-12-19 MED ORDER — RIVAROXABAN 15 MG PO TABS: 15.0000 mg | ORAL_TABLET | Freq: Every day | ORAL | 1 refills | Status: DC

## 2016-12-19 MED ORDER — TORSEMIDE 20 MG PO TABS: 20.0000 mg | ORAL_TABLET | Freq: Every day | ORAL | 1 refills | Status: DC

## 2016-12-19 MED ORDER — RIVAROXABAN 15 MG PO TABS: 15.0000 mg | ORAL_TABLET | Freq: Two times a day (BID) | ORAL | 1 refills | Status: DC

## 2016-12-19 NOTE — Patient Instructions (Signed)
Edema Edema is an abnormal buildup of fluids in your bodytissues. Edema is somewhatdependent on gravity to pull the fluid to the lowest place in your body. That makes the condition more common in the legs and thighs (lower extremities). Painless swelling of the feet and ankles is common and becomes more likely as you get older. It is also common in looser tissues, like around your eyes. When the affected area is squeezed, the fluid may move out of that spot and leave a dent for a few moments. This dent is called pitting. What are the causes? There are many possible causes of edema. Eating too much salt and being on your feet or sitting for a long time can cause edema in your legs and ankles. Hot weather may make edema worse. Common medical causes of edema include:  Heart failure.  Liver disease.  Kidney disease.  Weak blood vessels in your legs.  Cancer.  An injury.  Pregnancy.  Some medications.  Obesity.  What are the signs or symptoms? Edema is usually painless.Your skin may look swollen or shiny. How is this diagnosed? Your health care provider may be able to diagnose edema by asking about your medical history and doing a physical exam. You may need to have tests such as X-rays, an electrocardiogram, or blood tests to check for medical conditions that may cause edema. How is this treated? Edema treatment depends on the cause. If you have heart, liver, or kidney disease, you need the treatment appropriate for these conditions. General treatment may include:  Elevation of the affected body part above the level of your heart.  Compression of the affected body part. Pressure from elastic bandages or support stockings squeezes the tissues and forces fluid back into the blood vessels. This keeps fluid from entering the tissues.  Restriction of fluid and salt intake.  Use of a water pill (diuretic). These medications are appropriate only for some types of edema. They pull fluid  out of your body and make you urinate more often. This gets rid of fluid and reduces swelling, but diuretics can have side effects. Only use diuretics as directed by your health care provider.  Follow these instructions at home:  Keep the affected body part above the level of your heart when you are lying down.  Do not sit still or stand for prolonged periods.  Do not put anything directly under your knees when lying down.  Do not wear constricting clothing or garters on your upper legs.  Exercise your legs to work the fluid back into your blood vessels. This may help the swelling go down.  Wear elastic bandages or support stockings to reduce ankle swelling as directed by your health care provider.  Eat a low-salt diet to reduce fluid if your health care provider recommends it.  Only take medicines as directed by your health care provider. Contact a health care provider if:  Your edema is not responding to treatment.  You have heart, liver, or kidney disease and notice symptoms of edema.  You have edema in your legs that does not improve after elevating them.  You have sudden and unexplained weight gain. Get help right away if:  You develop shortness of breath or chest pain.  You cannot breathe when you lie down.  You develop pain, redness, or warmth in the swollen areas.  You have heart, liver, or kidney disease and suddenly get edema.  You have a fever and your symptoms suddenly get worse. This information is   not intended to replace advice given to you by your health care provider. Make sure you discuss any questions you have with your health care provider. Document Released: 10/30/2005 Document Revised: 04/06/2016 Document Reviewed: 08/22/2013 Elsevier Interactive Patient Education  2017 [employer]  

## 2016-12-19 NOTE — Progress Notes (Signed)
Pre visit review using our clinic review tool, if applicable. No additional management support is needed unless otherwise documented below in the visit note. 

## 2016-12-19 NOTE — Progress Notes (Signed)
Subjective:  Patient ID: Richard Blake, male    DOB: Dec 28, 1945  Age: 70 y.o. MRN: [id number]  CC: Hypertension and Atrial Fibrillation   HPI Richard Blake presents for follow-up after recent diagnosis of atrial fibrillation. He is scheduled for cardioversion later this week. He complains of worsening edema in his lower extremities and weight gain but says his dyspnea on exertion is unchanged. He has a rare nonproductive cough.  Outpatient Medications Prior to Visit  Medication Sig Dispense Refill  . Dietary Management Product (FOSTEUM PLUS) CAPS Take 1 capsule by mouth 2 (two) times daily. 60 capsule 11  . famotidine (PEPCID) 20 MG tablet Take 20 mg by mouth at bedtime.      . hydrOXYzine (ATARAX/VISTARIL) 10 MG tablet Take 1 tablet (10 mg total) by mouth 3 (three) times daily as needed. 30 tablet 0  . losartan (COZAAR) 100 MG tablet Take 1 tablet (100 mg total) by mouth daily. 90 tablet 3  . metoprolol tartrate (LOPRESSOR) 25 MG tablet Take 1 tablet (25 mg total) by mouth 2 (two) times daily. 180 tablet 3  . rosuvastatin (CRESTOR) 10 MG tablet Take 1 tablet (10 mg total) by mouth daily. 90 tablet 3  . vitamin B-12 (CYANOCOBALAMIN) 250 MCG tablet Take 250 mcg by mouth daily.      . Vitamin D, Ergocalciferol, (DRISDOL) 50000 units CAPS capsule 1 tab, twice a week (Patient taking differently: Take 50,000 Units by mouth every 7 (seven) days. 1 tab, twice a week) 12 capsule 3  . rivaroxaban (XARELTO) 20 MG TABS tablet Take 1 tablet (20 mg total) by mouth daily with supper. 30 tablet 5   No facility-administered medications prior to visit.     ROS Review of Systems  Constitutional: Positive for unexpected weight change. Negative for activity change, chills, diaphoresis and fatigue.  HENT: Negative.   Eyes: Negative for visual disturbance.  Respiratory: Positive for cough and shortness of breath (DOE). Negative for chest tightness and wheezing.   Cardiovascular: Positive for leg  swelling. Negative for chest pain and palpitations.  Gastrointestinal: Negative for abdominal pain, constipation, diarrhea, nausea and vomiting.  Genitourinary: Negative.  Negative for decreased urine volume and difficulty urinating.  Musculoskeletal: Negative.   Skin: Negative.   Neurological: Negative.  Negative for dizziness, weakness and light-headedness.  Hematological: Negative.   Psychiatric/Behavioral: Negative.     Objective:  BP 140/84 (BP Location: Left Arm, Patient Position: Sitting, Cuff Size: Normal)   Pulse 77   Resp 16   Ht 5\' 5"  (1.651 m)   Wt 199 lb (90.3 kg)   SpO2 96%   BMI 33.12 kg/m   BP Readings from Last 3 Encounters:  12/19/16 140/84  12/06/16 (!) 146/98  12/05/16 134/80    Wt Readings from Last 3 Encounters:  12/19/16 199 lb (90.3 kg)  12/06/16 194 lb 12.8 oz (88.4 kg)  12/05/16 191 lb 6 oz (86.8 kg)    Physical Exam  Constitutional: He is oriented to person, place, and time. No distress.  HENT:  Mouth/Throat: Oropharynx is clear and moist. No oropharyngeal exudate.  Eyes: Conjunctivae are normal. Right eye exhibits no discharge. Left eye exhibits no discharge. No scleral icterus.  Neck: Normal range of motion. Neck supple. No JVD present.  Cardiovascular: An irregularly irregular rhythm present. Tachycardia present.  Exam reveals no gallop.   No murmur heard. Pulmonary/Chest: Effort normal. No accessory muscle usage. No respiratory distress. He has decreased breath sounds. He has no wheezes. He has rhonchi  in the right middle field and the left middle field. He has no rales. He exhibits no tenderness.  Abdominal: Soft. Bowel sounds are normal. He exhibits no distension and no mass. There is no tenderness. There is no rebound and no guarding.  Musculoskeletal: He exhibits edema (2++ pitting edema in BLE). He exhibits no tenderness or deformity.  Neurological: He is alert and oriented to person, place, and time.  Skin: Skin is warm and dry. No rash  noted. He is not diaphoretic. No erythema. No pallor.    Lab Results  Component Value Date   WBC 4.4 12/14/2016   HGB 11.6 (L) 11/23/2016   HCT 34.0 (L) 12/14/2016   PLT 298 12/14/2016   GLUCOSE 88 12/19/2016   CHOL 169 01/19/2016   TRIG 79.0 01/19/2016   HDL 66.70 01/19/2016   LDLDIRECT 186.5 10/23/2012   LDLCALC 86 01/19/2016   ALT 21 11/23/2016   AST 24 11/23/2016   NA 143 12/19/2016   K 4.0 12/19/2016   CL 109 12/19/2016   CREATININE 1.84 (H) 12/19/2016   BUN 23 12/19/2016   CO2 27 12/19/2016   TSH 0.76 11/23/2016   PSA 3.67 02/23/2016   INR 1.1 12/14/2016    Dg Abd Acute W/chest  Result Date: 11/23/2016 CLINICAL DATA:  Pressure from hiatal hernia, mainly at night. EXAM: DG ABDOMEN ACUTE W/ 1V CHEST COMPARISON:  Chest radiograph 04/20/2016 FINDINGS: There is no evidence of dilated bowel loops or free intraperitoneal air. No radiopaque calculi or other significant radiographic abnormality is seen. Heart size is mildly enlarged, stable. Mediastinal contours are within normal limits. Chronic interstitial lung changes are also stable. No focal airspace consolidation. There is a large hiatal hernia. IMPRESSION: Nonobstructive bowel gas pattern. Large hiatal hernia. Chronic interstitial lung changes. Mildly enlarged cardiac silhouette, stable. Electronically Signed   By: Fidela Salisbury M.D.   On: 11/23/2016 14:41    Assessment & Plan:   Richard Blake was seen today for hypertension and atrial fibrillation.  Diagnoses and all orders for this visit:  Essential hypertension- his blood pressure is not quite adequately well controlled on metoprolol and losartan so will add a loop diuretic -     torsemide (DEMADEX) 20 MG tablet; Take 1 tablet (20 mg total) by mouth daily. -     Basic metabolic panel; Future  Persistent atrial fibrillation ([place])- he has good rate control, due to low GFR will increase the Xarelto dose from 20 mg a day to 15 mg a day. -     Discontinue: Rivaroxaban  (XARELTO) 15 MG TABS tablet; Take 1 tablet (15 mg total) by mouth 2 (two) times daily with a meal. -     Rivaroxaban (XARELTO) 15 MG TABS tablet; Take 1 tablet (15 mg total) by mouth daily with supper.  Localized edema- will diurese with a loop diuretic -     torsemide (DEMADEX) 20 MG tablet; Take 1 tablet (20 mg total) by mouth daily.  Deficiency anemia- he has iron deficiency anemia -     Vitamin B12; Future -     IBC panel; Future -     Ferritin; Future -     Folate; Future -     ferrous sulfate 325 (65 FE) MG tablet; Take 1 tablet (325 mg total) by mouth 3 (three) times daily with meals.  Cough- his FeNO score is not elevated so I don't think he has asthma or inflammatory process, he most likely has chronic bronchitis or early COPD, he tells me his  symptoms are not severe enough to require use of an inhaler. -     POCT EXHALED NITRIC OXIDE  Other iron deficiency anemia -     ferrous sulfate 325 (65 FE) MG tablet; Take 1 tablet (325 mg total) by mouth 3 (three) times daily with meals.   I have discontinued Mr. Carmer's rivaroxaban. I have also changed his Rivaroxaban. Additionally, I am having him start on torsemide and ferrous sulfate. Lastly, I am having him maintain his vitamin B-12, famotidine, losartan, rosuvastatin, FOSTEUM PLUS, Vitamin D (Ergocalciferol), hydrOXYzine, and metoprolol tartrate.  Meds ordered this encounter  Medications  . DISCONTD: Rivaroxaban (XARELTO) 15 MG TABS tablet    Sig: Take 1 tablet (15 mg total) by mouth 2 (two) times daily with a meal.    Dispense:  90 tablet    Refill:  1  . torsemide (DEMADEX) 20 MG tablet    Sig: Take 1 tablet (20 mg total) by mouth daily.    Dispense:  90 tablet    Refill:  1  . Rivaroxaban (XARELTO) 15 MG TABS tablet    Sig: Take 1 tablet (15 mg total) by mouth daily with supper.    Dispense:  90 tablet    Refill:  1  . ferrous sulfate 325 (65 FE) MG tablet    Sig: Take 1 tablet (325 mg total) by mouth 3 (three) times  daily with meals.    Dispense:  90 tablet    Refill:  4     Follow-up: Return in about 3 months (around 03/18/2017).  Scarlette Calico, MD

## 2016-12-21 ENCOUNTER — Ambulatory Visit (HOSPITAL_COMMUNITY): Payer: Medicare Other | Admitting: Anesthesiology

## 2016-12-21 ENCOUNTER — Encounter (HOSPITAL_COMMUNITY): Payer: Self-pay

## 2016-12-21 ENCOUNTER — Encounter (HOSPITAL_COMMUNITY)
Admission: RE | Disposition: A | Discharge status: Home / Self Care | Payer: Self-pay | Source: Ambulatory Visit | Attending: Internal Medicine

## 2016-12-21 ENCOUNTER — Ambulatory Visit (HOSPITAL_COMMUNITY)
Admission: RE | Admit: 2016-12-21 | Discharge: 2016-12-21 | Disposition: A | Discharge status: Home / Self Care | Payer: Medicare Other | Source: Ambulatory Visit | Attending: Internal Medicine | Admitting: Internal Medicine

## 2016-12-21 DIAGNOSIS — Z87442 Personal history of urinary calculi: Secondary | ICD-10-CM | POA: Insufficient documentation

## 2016-12-21 DIAGNOSIS — N400 Enlarged prostate without lower urinary tract symptoms: Secondary | ICD-10-CM | POA: Diagnosis not present

## 2016-12-21 DIAGNOSIS — Z9841 Cataract extraction status, right eye: Secondary | ICD-10-CM | POA: Diagnosis not present

## 2016-12-21 DIAGNOSIS — Z7952 Long term (current) use of systemic steroids: Secondary | ICD-10-CM | POA: Diagnosis not present

## 2016-12-21 DIAGNOSIS — Z888 Allergy status to other drugs, medicaments and biological substances status: Secondary | ICD-10-CM | POA: Diagnosis not present

## 2016-12-21 DIAGNOSIS — Z807 Family history of other malignant neoplasms of lymphoid, hematopoietic and related tissues: Secondary | ICD-10-CM | POA: Insufficient documentation

## 2016-12-21 DIAGNOSIS — M1990 Unspecified osteoarthritis, unspecified site: Secondary | ICD-10-CM | POA: Diagnosis not present

## 2016-12-21 DIAGNOSIS — Z91048 Other nonmedicinal substance allergy status: Secondary | ICD-10-CM | POA: Insufficient documentation

## 2016-12-21 DIAGNOSIS — Z79899 Other long term (current) drug therapy: Secondary | ICD-10-CM | POA: Insufficient documentation

## 2016-12-21 DIAGNOSIS — Z7901 Long term (current) use of anticoagulants: Secondary | ICD-10-CM | POA: Insufficient documentation

## 2016-12-21 DIAGNOSIS — K449 Diaphragmatic hernia without obstruction or gangrene: Secondary | ICD-10-CM | POA: Diagnosis not present

## 2016-12-21 DIAGNOSIS — K219 Gastro-esophageal reflux disease without esophagitis: Secondary | ICD-10-CM | POA: Insufficient documentation

## 2016-12-21 DIAGNOSIS — Z8371 Family history of colonic polyps: Secondary | ICD-10-CM | POA: Insufficient documentation

## 2016-12-21 DIAGNOSIS — I481 Persistent atrial fibrillation: Secondary | ICD-10-CM | POA: Insufficient documentation

## 2016-12-21 DIAGNOSIS — I10 Essential (primary) hypertension: Secondary | ICD-10-CM | POA: Diagnosis not present

## 2016-12-21 DIAGNOSIS — Z9842 Cataract extraction status, left eye: Secondary | ICD-10-CM | POA: Diagnosis not present

## 2016-12-21 DIAGNOSIS — Z8051 Family history of malignant neoplasm of kidney: Secondary | ICD-10-CM | POA: Insufficient documentation

## 2016-12-21 DIAGNOSIS — Z981 Arthrodesis status: Secondary | ICD-10-CM | POA: Diagnosis not present

## 2016-12-21 DIAGNOSIS — Z825 Family history of asthma and other chronic lower respiratory diseases: Secondary | ICD-10-CM | POA: Insufficient documentation

## 2016-12-21 DIAGNOSIS — Z9889 Other specified postprocedural states: Secondary | ICD-10-CM | POA: Diagnosis not present

## 2016-12-21 DIAGNOSIS — E785 Hyperlipidemia, unspecified: Secondary | ICD-10-CM | POA: Diagnosis not present

## 2016-12-21 DIAGNOSIS — Z833 Family history of diabetes mellitus: Secondary | ICD-10-CM | POA: Diagnosis not present

## 2016-12-21 DIAGNOSIS — J849 Interstitial pulmonary disease, unspecified: Secondary | ICD-10-CM | POA: Diagnosis not present

## 2016-12-21 DIAGNOSIS — Z8261 Family history of arthritis: Secondary | ICD-10-CM | POA: Insufficient documentation

## 2016-12-21 DIAGNOSIS — D869 Sarcoidosis, unspecified: Secondary | ICD-10-CM | POA: Diagnosis not present

## 2016-12-21 DIAGNOSIS — Z8042 Family history of malignant neoplasm of prostate: Secondary | ICD-10-CM | POA: Insufficient documentation

## 2016-12-21 DIAGNOSIS — I4891 Unspecified atrial fibrillation: Secondary | ICD-10-CM | POA: Diagnosis not present

## 2016-12-21 HISTORY — PX: CARDIOVERSION: SHX1299

## 2016-12-21 SURGERY — CARDIOVERSION
Anesthesia: Monitor Anesthesia Care

## 2016-12-21 MED ORDER — PROPOFOL 10 MG/ML IV BOLUS
INTRAVENOUS | Status: DC | PRN
  Administered 2016-12-21: 75 mg via INTRAVENOUS

## 2016-12-21 MED ORDER — LIDOCAINE HCL (CARDIAC) 20 MG/ML IV SOLN
INTRAVENOUS | Status: DC | PRN
  Administered 2016-12-21: 60 mg via INTRAVENOUS

## 2016-12-21 MED ORDER — SODIUM CHLORIDE 0.9 % IV SOLN
INTRAVENOUS | Status: DC
  Administered 2016-12-21: 12:00:00 via INTRAVENOUS

## 2016-12-21 NOTE — Anesthesia Postprocedure Evaluation (Addendum)
Anesthesia Post Note  Patient: Richard Blake  Procedure(s) Performed: Procedure(s) (LRB): CARDIOVERSION (N/A)  Patient location during evaluation: Endoscopy Anesthesia Type: General Level of consciousness: awake and alert Pain management: pain level controlled Vital Signs Assessment: post-procedure vital signs reviewed and stable Respiratory status: spontaneous breathing, nonlabored ventilation, respiratory function stable and patient connected to nasal cannula oxygen Cardiovascular status: blood pressure returned to baseline and stable Postop Assessment: no signs of nausea or vomiting Anesthetic complications: no       Last Vitals:  Vitals:   12/21/16 1242 12/21/16 1243  BP: 120/72   Pulse: 73 60  Resp: 19 18    Last Pain:  Vitals:   12/21/16 1127  TempSrc: Oral                 Tabetha Haraway,JAMES TERRILL

## 2016-12-21 NOTE — Anesthesia Preprocedure Evaluation (Signed)
Anesthesia Evaluation  Patient identified by MRN, date of birth, ID band Patient awake    Airway Mallampati: II  TM Distance: >3 FB Neck ROM: Full    Dental  (+) Dental Advisory Given   Pulmonary    breath sounds clear to auscultation       Cardiovascular hypertension, + DOE   Rhythm:Irregular Rate:Normal     Neuro/Psych  Neuromuscular disease    GI/Hepatic GERD  ,  Endo/Other    Renal/GU Renal disease     Musculoskeletal   Abdominal (+) - obese,   Peds  Hematology   Anesthesia Other Findings   Reproductive/Obstetrics                             Anesthesia Physical Anesthesia Plan  ASA: III  Anesthesia Plan: General   Post-op Pain Management:    Induction: Intravenous  Airway Management Planned: Mask  Additional Equipment:   Intra-op Plan:   Post-operative Plan:   Informed Consent: I have reviewed the patients History and Physical, chart, labs and discussed the procedure including the risks, benefits and alternatives for the proposed anesthesia with the patient or authorized representative who has indicated his/her understanding and acceptance.   Dental advisory given  Plan Discussed with: CRNA  Anesthesia Plan Comments:         Anesthesia Quick Evaluation

## 2016-12-21 NOTE — Transfer of Care (Signed)
Immediate Anesthesia Transfer of Care Note  Patient: Richard Blake  Procedure(s) Performed: Procedure(s): CARDIOVERSION (N/A)  Patient Location: PACU  Anesthesia Type:General  Level of Consciousness: awake, alert , oriented and patient cooperative  Airway & Oxygen Therapy: Patient Spontanous Breathing and Patient connected to nasal cannula oxygen  Post-op Assessment: Report given to RN, Post -op Vital signs reviewed and stable and Patient moving all extremities  Post vital signs: Reviewed and stable  Last Vitals:  Vitals:   12/21/16 1127  BP: (!) 151/100  Pulse: 82  Resp: 18    Last Pain:  Vitals:   12/21/16 1127  TempSrc: Oral         Complications: No apparent anesthesia complications

## 2016-12-21 NOTE — Interval H&P Note (Signed)
History and Physical Interval Note: Since his last clinic visit, no change. He denies missing any of his anti-coagulation.  12/21/2016 12:13 PM  Richard Blake  has presented today for surgery, with the diagnosis of AFIB  The various methods of treatment have been discussed with the patient and family. After consideration of risks, benefits and other options for treatment, the patient has consented to  Procedure(s): CARDIOVERSION (N/A) as a surgical intervention .  The patient's history has been reviewed, patient examined, no change in status, stable for surgery.  I have reviewed the patient's chart and labs.  Questions were answered to the patient's satisfaction.      Cristopher Peru

## 2016-12-21 NOTE — CV Procedure (Signed)
EP procedure note  Preoperative diagnosis: Persistent atrial fibrillation  Postoperative diagnosis: Persistent atrial fibrillation  Description of the procedure: After informed consent was obtained, the patient was prepped and draped in the usual manner. The anesthesia service was utilized to provide deep IV sedation. The electro-dispersive pad was placed in the anterior posterior position. The patient was cardioverted initially with 120 joules of synchronized biphasic energy, which restored sinus rhythm for approximately 20 seconds. He had early return of atrial fibrillation. He was cardioverted again with 200 J of synchronized biphasic energy and again had early return of atrial fibrillation. No additional cardioversion times were performed. The patient was allowed to awaken and recover in the usual manner.  Complications: There were no immediate page palpitations  Conclusion: Unsuccessful cardioversion in a patient with persistent atrial fibrillation who had initial sinus rhythm but then had early return of atrial fibrillation.  Cristopher Peru, M.D.

## 2016-12-21 NOTE — H&P (View-Only) (Signed)
HPI Mr. Nocera is referred today for evaluation of atrial fib. He was discovered to have atrial fib by Dr. Ronnald Ramp and was seen in our office and is referred for EP evaluation. He does not feel palpitations but does note that over the past several weeks his energy level is reduced and he gets sob with exertion. No syncope. He has also noted mild peripheral edema. Allergies  Allergen Reactions  . Amlodipine Other (See Comments)    edema  . Welchol [Colesevelam Hcl]     Stomach cramps  . Other     Hair dye - facial swelling   . Prolia [Denosumab]     Pain at injection sight     Current Outpatient Prescriptions  Medication Sig Dispense Refill  . Dietary Management Product (FOSTEUM PLUS) CAPS Take 1 capsule by mouth 2 (two) times daily. 60 capsule 11  . famotidine (PEPCID) 20 MG tablet Take 20 mg by mouth at bedtime.      . hydrOXYzine (ATARAX/VISTARIL) 10 MG tablet Take 1 tablet (10 mg total) by mouth 3 (three) times daily as needed. 30 tablet 0  . losartan (COZAAR) 100 MG tablet Take 1 tablet (100 mg total) by mouth daily. 90 tablet 3  . metoprolol tartrate (LOPRESSOR) 25 MG tablet Take 1 tablet (25 mg total) by mouth 2 (two) times daily. 180 tablet 3  . rivaroxaban (XARELTO) 20 MG TABS tablet Take 1 tablet (20 mg total) by mouth daily with supper. 20 tablet 0  . rosuvastatin (CRESTOR) 10 MG tablet Take 1 tablet (10 mg total) by mouth daily. 90 tablet 3  . vitamin B-12 (CYANOCOBALAMIN) 250 MCG tablet Take 250 mcg by mouth daily.      . Vitamin D, Ergocalciferol, (DRISDOL) 50000 units CAPS capsule 1 tab, twice a week (Patient taking differently: Take 50,000 Units by mouth every 7 (seven) days. 1 tab, twice a week) 12 capsule 3   No current facility-administered medications for this visit.      Past Medical History:  Diagnosis Date  . A-fib ([place]) 11/2016   Tx with po meds at this time  . Anemia    Microcytic with Fe Sat 4% 10/31/10  . Atypical chest pain   . Cataract    removed bilaterally  . Chronic rhinitis   . Cough   . Diverticulosis   . GERD (gastroesophageal reflux disease)   . Hiatal hernia   . Hiatal hernia   . HLD (hyperlipidemia)   . HTN (hypertension)   . Interstitial lung disease ([place])   . Kidney stones   . Osteoarthritis   . Sarcoidosis ([place]) 8/99   Transbronchial biopsy, taking daily prednisone since  November 08 on and off since 1999 w/cough every time he relapses off it for more than several weeks  . 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   Past Surgical History:  Procedure Laterality Date  . CATARACT EXTRACTION Left 10/14/2013  . CATARACT EXTRACTION Right 11/24/2013  . CERVICAL FUSION     pt states he did not have a cervical fusion, just a biopsy   . COLONOSCOPY  01/12/2006   tics only   . INGUINAL HERNIA REPAIR     right  . LIPOMA EXCISION Left 09/16/2004   forearm  . spinal cord biopsyOther]  1996   sarcoid     Family History  Problem  Relation Age of Onset  . Asthma Mother   . Diabetes Mother   . Arthritis Paternal Grandmother   . Heart disease Paternal Grandmother   . Hypertension Father     Fathers family most  . Prostate cancer Father   . Kidney disease Father   . Colon polyps Sister   . Multiple myeloma Sister   . Heart disease Paternal Uncle   . Kidney disease Sister   . Sarcoidosis Neg Hx   . Other Neg Hx     osteoporosis  . Colon cancer Neg Hx   . Esophageal cancer Neg Hx   . Rectal cancer Neg Hx   . Stomach cancer Neg Hx      Social History   Social History  . Marital status: Single    Spouse name: N/A  . Number of children: 0  . Years of education: N/A   Occupational History  . Retired [employer]  . Smoking status: Never Smoker  . Smokeless tobacco: Never Used  . Alcohol use No  . Drug use: No  . Sexual activity: Not Currently    Other Topics Concern  . Not on file   Social History Narrative   Regular Exercise -  NO     BP (!) 146/98   Pulse 74   Ht 5' 5" (1.651 m)   Wt 194 lb 12.8 oz (88.4 kg)   BMI 32.42 kg/m   Physical Exam:  Well appearing NAD HEENT: Unremarkable Neck:  No JVD, no thyromegally Lymphatics:  No adenopathy Back:  No CVA tenderness Lungs:  Clear HEART:  Regular rate rhythm, no murmurs, no rubs, no clicks Abd:  soft, positive bowel sounds, no organomegally, no rebound, no guarding Ext:  2 plus pulses, no edema, no cyanosis, no clubbing Skin:  No rashes no nodules Neuro:  CN II through XII intact, motor grossly intact  EKG - reviewed. Atrial fib with a CVR   Assess/Plan: 1. Atrial fib - while he does not have palpitations, I do think he is symptomatic. Will plan to proceed with DCCV 2. Coags - he has been on Xarelto since 1/11. He denies missing any doses. He is instructed not to miss any of his blood thinners. 3. HTN heart disease - his blood pressure is not well controlled. We discussed switching to coreg but for now will continue his metoprolol. 4. Sarcoidosis - it is unclear if he has cardiac involvement. He will undergo watchful waiting.  Mikle Bosworth.D.

## 2016-12-21 NOTE — Anesthesia Procedure Notes (Signed)
Procedure Name: MAC Date/Time: 12/21/2016 12:25 PM Performed by: Izora Gala Pre-anesthesia Checklist: Patient identified, Emergency Drugs available, Suction available and Patient being monitored Patient Re-evaluated:Patient Re-evaluated prior to inductionOxygen Delivery Method: Ambu bag Preoxygenation: Pre-oxygenation with 100% oxygen Intubation Type: IV induction Ventilation: Mask ventilation without difficulty Placement Confirmation: positive ETCO2 Dental Injury: Teeth and Oropharynx as per pre-operative assessment

## 2016-12-22 ENCOUNTER — Encounter (HOSPITAL_COMMUNITY): Payer: Self-pay | Admitting: Internal Medicine

## 2017-01-08 ENCOUNTER — Telehealth: Payer: Self-pay | Admitting: Internal Medicine

## 2017-01-08 NOTE — Telephone Encounter (Signed)
Called patient to schedule annual wellness appt. Lvm for patient to call office to schedule appt.  °

## 2017-01-10 ENCOUNTER — Telehealth (HOSPITAL_COMMUNITY): Payer: Self-pay | Admitting: *Deleted

## 2017-01-10 ENCOUNTER — Ambulatory Visit (INDEPENDENT_AMBULATORY_CARE_PROVIDER_SITE_OTHER): Payer: Medicare Other | Admitting: Internal Medicine

## 2017-01-10 ENCOUNTER — Telehealth: Payer: Self-pay | Admitting: Pharmacist

## 2017-01-10 ENCOUNTER — Encounter: Payer: Self-pay | Admitting: Internal Medicine

## 2017-01-10 VITALS — BP 140/86 | HR 77 | Ht 65.0 in | Wt 193.6 lb

## 2017-01-10 DIAGNOSIS — I481 Persistent atrial fibrillation: Secondary | ICD-10-CM | POA: Diagnosis not present

## 2017-01-10 DIAGNOSIS — I4819 Other persistent atrial fibrillation: Secondary | ICD-10-CM

## 2017-01-10 NOTE — Telephone Encounter (Signed)
Pt to be started on Tikosyn 3/19. Medication list reviewed and pt is not currently taking any contraindicated medications or QTc prolonging medications. Pt is on appropriate dose of Xarelto 15mg  daily given CrCl of 58mL/min.  Please confirm that pt has not missed any doses of Xarelto in the past month and that he will not use OTC Benadryl since this will be contraindicated with Tikosyn.

## 2017-01-10 NOTE — Telephone Encounter (Signed)
-----   Message from Sherran Needs, NP sent at 01/10/2017  3:03 PM EST ----- Regarding: FW: Tikosyn Admission   ----- Message ----- From: Iven Finn, LPN Sent: [id number]   2:26 PM To: Sherran Needs, NP Subject: Tikosyn Admission                              Pt needs to be scheduled for Tikosyn Admission. Try to schedule a day that Dr. Lovena Le is in the hospital, if possible.   Thanks,  Renae

## 2017-01-10 NOTE — Patient Instructions (Addendum)
Medication Instructions:  Your physician recommends that you continue on your current medications as directed. Please refer to the Current Medication list given to you today.   Labwork: None Ordered   Testing/Procedures: Tikosyn Admission (Try to schedule a day that Dr. Lovena Le is in the hospital, if possible)   Follow-Up: Your physician recommends that you schedule a follow-up appointment in: 1 month with Dr. Lovena Le    Any Other Special Instructions Will Be Listed Below (If Applicable). Roderic Palau, NP in the a-fib clinic will contact you to set up pre-admission appointment and hospital admission date/time.      If you need a refill on your cardiac medications before your next appointment, please call your pharmacy.

## 2017-01-10 NOTE — Telephone Encounter (Signed)
LMOM for pt to clbk and sched appt with Roderic Palau, NP for 01/29/2017.  Dr Lovena Le is in the hospital that Monday.  See previous note.

## 2017-01-10 NOTE — Progress Notes (Signed)
HPI Mr. Szafranski returns today for followup. He is a pleasant 71 yo man with pulmonary sarcoidosis, in remission, with HTN, diastolic heart failure, and persistent atrial fib. He underwent DCCV last week and had ERAF. He c/o sob and lower extremity edema. He has not had syncope. He has class 2B heart failure symptoms. Allergies  Allergen Reactions  . Amlodipine Other (See Comments)    edema  . Welchol [Colesevelam Hcl]     Stomach cramps  . Other     Hair dye - facial swelling   . Prolia [Denosumab]     Pain at injection sight     Current Outpatient Prescriptions  Medication Sig Dispense Refill  . Dietary Management Product (FOSTEUM PLUS) CAPS Take 1 capsule by mouth 2 (two) times daily. 60 capsule 11  . famotidine (PEPCID) 20 MG tablet Take 20 mg by mouth at bedtime.      . ferrous sulfate 325 (65 FE) MG tablet Take 1 tablet (325 mg total) by mouth 3 (three) times daily with meals. 90 tablet 4  . hydrOXYzine (ATARAX/VISTARIL) 10 MG tablet Take 1 tablet (10 mg total) by mouth 3 (three) times daily as needed. 30 tablet 0  . losartan (COZAAR) 100 MG tablet Take 1 tablet (100 mg total) by mouth daily. 90 tablet 3  . metoprolol tartrate (LOPRESSOR) 25 MG tablet Take 1 tablet (25 mg total) by mouth 2 (two) times daily. 180 tablet 3  . Rivaroxaban (XARELTO) 15 MG TABS tablet Take 1 tablet (15 mg total) by mouth daily with supper. 90 tablet 1  . rosuvastatin (CRESTOR) 10 MG tablet Take 1 tablet (10 mg total) by mouth daily. 90 tablet 3  . torsemide (DEMADEX) 20 MG tablet Take 1 tablet (20 mg total) by mouth daily. 90 tablet 1  . vitamin B-12 (CYANOCOBALAMIN) 250 MCG tablet Take 250 mcg by mouth daily.      . Vitamin D, Ergocalciferol, (DRISDOL) 50000 units CAPS capsule 1 tab, twice a week (Patient taking differently: Take 50,000 Units by mouth every 7 (seven) days. 1 tab, twice a week) 12 capsule 3   No current facility-administered medications for this visit.      Past Medical  History:  Diagnosis Date  . A-fib ([place]) 11/2016   Tx with po meds at this time  . Anemia    Microcytic with Fe Sat 4% 10/31/10  . Atypical chest pain   . Cataract    removed bilaterally  . Chronic rhinitis   . Cough   . Diverticulosis   . GERD (gastroesophageal reflux disease)   . Hiatal hernia   . Hiatal hernia   . HLD (hyperlipidemia)   . HTN (hypertension)   . Interstitial lung disease ([place])   . Kidney stones   . Osteoarthritis   . Sarcoidosis ([place]) 8/99   Transbronchial biopsy, taking daily prednisone since  November 08 on and off since 1999 w/cough every time he relapses off it for more than several weeks  . 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   Past Surgical History:  Procedure Laterality Date  . CARDIOVERSION N/A 12/21/2016   Procedure: CARDIOVERSION;  Surgeon: Evans Lance, MD;  Location: [facility];  Service: Cardiovascular;  Laterality: N/A;  . CATARACT EXTRACTION Left 10/14/2013  . CATARACT EXTRACTION Right 11/24/2013  . CERVICAL FUSION     pt  states he did not have a cervical fusion, just a biopsy   . COLONOSCOPY  01/12/2006   tics only   . INGUINAL HERNIA REPAIR     right  . LIPOMA EXCISION Left 09/16/2004   forearm  . spinal cord biopsyOther]  1996   sarcoid     Family History  Problem Relation Age of Onset  . Asthma Mother   . Diabetes Mother   . Arthritis Paternal Grandmother   . Heart disease Paternal Grandmother   . Hypertension Father     Fathers family most  . Prostate cancer Father   . Kidney disease Father   . Colon polyps Sister   . Multiple myeloma Sister   . Heart disease Paternal Uncle   . Kidney disease Sister   . Sarcoidosis Neg Hx   . Other Neg Hx     osteoporosis  . Colon cancer Neg Hx   . Esophageal cancer Neg Hx   . Rectal cancer Neg Hx   . Stomach cancer Neg Hx      Social History   Social History   . Marital status: Single    Spouse name: N/A  . Number of children: 0  . Years of education: N/A   Occupational History  . Retired [employer]  . Smoking status: Never Smoker  . Smokeless tobacco: Never Used  . Alcohol use No  . Drug use: No  . Sexual activity: Not Currently   Other Topics Concern  . Not on file   Social History Narrative   Regular Exercise -  NO     BP 140/86   Pulse 77   Ht '5\' 5"'  (1.651 m)   Wt 193 lb 9.6 oz (87.8 kg)   SpO2 95%   BMI 32.22 kg/m   Physical Exam:  Well appearing 71 yo man, NAD HEENT: Unremarkable Neck:  6 cm JVD, no thyromegally Lymphatics:  No adenopathy Back:  No CVA tenderness Lungs:  Clear with no wheezes HEART:  Regular rate rhythm, no murmurs, no rubs, no clicks Abd:  soft, positive bowel sounds, no organomegally, no rebound, no guarding Ext:  2 plus pulses, no edema, no cyanosis, no clubbing Skin:  No rashes no nodules Neuro:  CN II through XII intact, motor grossly intact  EKG - atrial fib with a controlled VR  Assess/Plan: 1. Persistent atrial fib - we discussed the treatment options in detail. I have offered him initiation of dofetilide vs rate control of his atrial fib. He is considering his options and would like to come in for dofetilide. 2. coags - he appears to be tolerating Xarelto. 3. Peripheral edema - I suspect this is multifactorial. He will continue the torsemide for now 4. Dyslipidemia - he will continue his statin therapy.  Mikle [email or web address].D.

## 2017-01-11 ENCOUNTER — Telehealth: Payer: Self-pay | Admitting: Gastroenterology

## 2017-01-12 ENCOUNTER — Ambulatory Visit: Payer: Medicare Other | Admitting: Internal Medicine

## 2017-01-15 NOTE — Addendum Note (Signed)
Addended by: Campbell Riches on: 01/15/2017 02:44 PM   Modules accepted: Orders

## 2017-01-15 NOTE — Telephone Encounter (Signed)
02/06/17 EGD with Dr Ardis Hughs, he states he is seeing cardiology for Afib.  Cardio is putting him on a medication (tikcsyn) that he will need to stay in the hospital 3 days for observation for on 01/29/17.  He is currently still taking xarelto.  The pt wants to know if he needs to push the EGD out further due to the changes coming up.  Please advise

## 2017-01-16 NOTE — Telephone Encounter (Signed)
The pt will call after all his test are completed.  EGD cancelled.

## 2017-01-16 NOTE — Telephone Encounter (Signed)
Yes, lets push it out a month.  thanks

## 2017-01-18 ENCOUNTER — Other Ambulatory Visit: Payer: Self-pay | Admitting: Internal Medicine

## 2017-01-18 DIAGNOSIS — E782 Mixed hyperlipidemia: Secondary | ICD-10-CM

## 2017-01-29 ENCOUNTER — Inpatient Hospital Stay (HOSPITAL_COMMUNITY)
Admission: AD | Admit: 2017-01-29 | Discharge: 2017-02-01 | DRG: 308 | Disposition: A | Discharge status: Home / Self Care | Payer: Medicare Other | Source: Ambulatory Visit | Attending: Internal Medicine | Admitting: Internal Medicine

## 2017-01-29 ENCOUNTER — Encounter (HOSPITAL_COMMUNITY): Payer: Self-pay | Admitting: Nurse Practitioner

## 2017-01-29 ENCOUNTER — Ambulatory Visit (HOSPITAL_BASED_OUTPATIENT_CLINIC_OR_DEPARTMENT_OTHER)
Admission: RE | Admit: 2017-01-29 | Discharge: 2017-01-29 | Disposition: A | Discharge status: Home / Self Care | Payer: Medicare Other | Source: Ambulatory Visit | Attending: Nurse Practitioner | Admitting: Nurse Practitioner

## 2017-01-29 ENCOUNTER — Encounter (HOSPITAL_COMMUNITY): Payer: Self-pay | Admitting: General Practice

## 2017-01-29 VITALS — BP 156/92 | HR 84 | Ht 65.0 in | Wt 199.4 lb

## 2017-01-29 DIAGNOSIS — Z87442 Personal history of urinary calculi: Secondary | ICD-10-CM

## 2017-01-29 DIAGNOSIS — D860 Sarcoidosis of lung: Secondary | ICD-10-CM | POA: Diagnosis present

## 2017-01-29 DIAGNOSIS — Z888 Allergy status to other drugs, medicaments and biological substances status: Secondary | ICD-10-CM | POA: Diagnosis not present

## 2017-01-29 DIAGNOSIS — I5033 Acute on chronic diastolic (congestive) heart failure: Secondary | ICD-10-CM | POA: Diagnosis present

## 2017-01-29 DIAGNOSIS — E785 Hyperlipidemia, unspecified: Secondary | ICD-10-CM | POA: Diagnosis present

## 2017-01-29 DIAGNOSIS — I481 Persistent atrial fibrillation: Secondary | ICD-10-CM

## 2017-01-29 DIAGNOSIS — I130 Hypertensive heart and chronic kidney disease with heart failure and stage 1 through stage 4 chronic kidney disease, or unspecified chronic kidney disease: Secondary | ICD-10-CM | POA: Diagnosis present

## 2017-01-29 DIAGNOSIS — Z79899 Other long term (current) drug therapy: Secondary | ICD-10-CM

## 2017-01-29 DIAGNOSIS — K219 Gastro-esophageal reflux disease without esophagitis: Secondary | ICD-10-CM | POA: Diagnosis present

## 2017-01-29 DIAGNOSIS — Z7901 Long term (current) use of anticoagulants: Secondary | ICD-10-CM | POA: Diagnosis not present

## 2017-01-29 DIAGNOSIS — Z5181 Encounter for therapeutic drug level monitoring: Secondary | ICD-10-CM

## 2017-01-29 DIAGNOSIS — I5032 Chronic diastolic (congestive) heart failure: Secondary | ICD-10-CM | POA: Diagnosis not present

## 2017-01-29 DIAGNOSIS — N183 Chronic kidney disease, stage 3 (moderate): Secondary | ICD-10-CM | POA: Diagnosis present

## 2017-01-29 DIAGNOSIS — I4819 Other persistent atrial fibrillation: Secondary | ICD-10-CM

## 2017-01-29 HISTORY — DX: Dyspnea, unspecified: R06.00

## 2017-01-29 LAB — BASIC METABOLIC PANEL
ANION GAP: 11 (ref 5–15)
BUN: 16 mg/dL (ref 6–20)
CHLORIDE: 108 mmol/L (ref 101–111)
CO2: 23 mmol/L (ref 22–32)
Calcium: 9.1 mg/dL (ref 8.9–10.3)
Creatinine, Ser: 1.84 mg/dL — ABNORMAL HIGH (ref 0.61–1.24)
GFR calc non Af Amer: 36 mL/min — ABNORMAL LOW (ref >60)
GFR, EST AFRICAN AMERICAN: 41 mL/min — AB (ref >60)
Glucose, Bld: 96 mg/dL (ref 65–99)
POTASSIUM: 4 mmol/L (ref 3.5–5.1)
SODIUM: 142 mmol/L (ref 135–145)

## 2017-01-29 LAB — MAGNESIUM
MAGNESIUM: 1.8 mg/dL (ref 1.7–2.4)
MAGNESIUM: 2.3 mg/dL (ref 1.7–2.4)

## 2017-01-29 MED ORDER — MAGNESIUM SULFATE 2 GM/50ML IV SOLN
2.0000 g | Freq: Once | INTRAVENOUS | Status: AC
  Administered 2017-01-29: 2 g via INTRAVENOUS
  Filled 2017-01-29: qty 50

## 2017-01-29 MED ORDER — FAMOTIDINE 20 MG PO TABS
20.0000 mg | ORAL_TABLET | Freq: Every day | ORAL | Status: DC
  Administered 2017-01-29 – 2017-01-31 (×3): 20 mg via ORAL
  Filled 2017-01-29 (×3): qty 1

## 2017-01-29 MED ORDER — FERROUS SULFATE 325 (65 FE) MG PO TABS
325.0000 mg | ORAL_TABLET | Freq: Three times a day (TID) | ORAL | Status: DC
  Administered 2017-01-29 – 2017-02-01 (×7): 325 mg via ORAL
  Filled 2017-01-29 (×7): qty 1

## 2017-01-29 MED ORDER — ROSUVASTATIN CALCIUM 10 MG PO TABS
10.0000 mg | ORAL_TABLET | Freq: Every day | ORAL | Status: DC
  Administered 2017-01-29 – 2017-01-31 (×3): 10 mg via ORAL
  Filled 2017-01-29 (×3): qty 1

## 2017-01-29 MED ORDER — SODIUM CHLORIDE 0.9% FLUSH
3.0000 mL | Freq: Two times a day (BID) | INTRAVENOUS | Status: DC
  Administered 2017-01-29 – 2017-01-30 (×3): 3 mL via INTRAVENOUS

## 2017-01-29 MED ORDER — MAGNESIUM SULFATE IN D5W 1-5 GM/100ML-% IV SOLN
1.0000 g | Freq: Once | INTRAVENOUS | Status: DC
  Filled 2017-01-29: qty 100

## 2017-01-29 MED ORDER — POTASSIUM CHLORIDE CRYS ER 20 MEQ PO TBCR
20.0000 meq | EXTENDED_RELEASE_TABLET | Freq: Every day | ORAL | Status: DC
  Administered 2017-01-29 – 2017-02-01 (×4): 20 meq via ORAL
  Filled 2017-01-29 (×4): qty 1

## 2017-01-29 MED ORDER — SODIUM CHLORIDE 0.9% FLUSH: 3.0000 mL | INTRAVENOUS | Status: DC | PRN

## 2017-01-29 MED ORDER — DOFETILIDE 250 MCG PO CAPS
250.0000 ug | ORAL_CAPSULE | Freq: Two times a day (BID) | ORAL | Status: DC
  Administered 2017-01-29 – 2017-01-31 (×5): 250 ug via ORAL
  Filled 2017-01-29 (×5): qty 1

## 2017-01-29 MED ORDER — SODIUM CHLORIDE 0.9 % IV SOLN: 250.0000 mL | INTRAVENOUS | Status: DC | PRN

## 2017-01-29 MED ORDER — ADULT MULTIVITAMIN W/MINERALS CH
1.0000 | ORAL_TABLET | Freq: Two times a day (BID) | ORAL | Status: DC
  Administered 2017-01-29 – 2017-02-01 (×6): 1 via ORAL
  Filled 2017-01-29 (×6): qty 1

## 2017-01-29 MED ORDER — CYANOCOBALAMIN 250 MCG PO TABS
250.0000 ug | ORAL_TABLET | Freq: Every day | ORAL | Status: DC
  Administered 2017-01-29 – 2017-02-01 (×3): 250 ug via ORAL
  Filled 2017-01-29 (×4): qty 1

## 2017-01-29 MED ORDER — LOSARTAN POTASSIUM 50 MG PO TABS
100.0000 mg | ORAL_TABLET | Freq: Every day | ORAL | Status: DC
  Administered 2017-01-30 – 2017-02-01 (×3): 100 mg via ORAL
  Filled 2017-01-29 (×3): qty 2

## 2017-01-29 MED ORDER — VITAMIN D (ERGOCALCIFEROL) 1.25 MG (50000 UNIT) PO CAPS: 50000.0000 [IU] | ORAL_CAPSULE | ORAL | Status: DC

## 2017-01-29 MED ORDER — RIVAROXABAN 15 MG PO TABS
15.0000 mg | ORAL_TABLET | Freq: Every day | ORAL | Status: DC
  Administered 2017-01-29 – 2017-01-31 (×3): 15 mg via ORAL
  Filled 2017-01-29 (×3): qty 1

## 2017-01-29 MED ORDER — FUROSEMIDE 10 MG/ML IJ SOLN
40.0000 mg | Freq: Two times a day (BID) | INTRAMUSCULAR | Status: DC
  Administered 2017-01-29 – 2017-02-01 (×6): 40 mg via INTRAVENOUS
  Filled 2017-01-29 (×6): qty 4

## 2017-01-29 MED ORDER — METOPROLOL TARTRATE 25 MG PO TABS
25.0000 mg | ORAL_TABLET | Freq: Two times a day (BID) | ORAL | Status: DC
  Administered 2017-01-29 – 2017-02-01 (×6): 25 mg via ORAL
  Filled 2017-01-29 (×6): qty 1

## 2017-01-29 NOTE — Progress Notes (Signed)
Primary Care Physician: Scarlette Calico, MD Referring Physician: Dr. Essie Christine Richard Blake is a 71 y.o. male with a h/o persistent afib that is in the clinic today for admission to [facility] for tikosyn administration. He did not know the cost of the drug prior to visit and made aware by his plan it would be $140 a month. He is agreeable to pay this cost. He is aware of tikosyn precautions and the need to take on a very regular basis. No current qtc prolonging drugs. Meds previously screened by PharmD. Labs today with K+ at 4 and mag at 1.8 and he will be given IV mag on admission. He states that he has had significant LEE since being in afib. He was on torsemide x 4 days but stopped drug as it was not improving the situation and just making him stay in the bathroom.Echo does show severe TR and moderate pulmonary HTN which may be contributing. QTC today 465 ms in afib, rate controlled at 84 bpm, but on previous EKG's and in SR qtc is acceptable.  Today, he denies symptoms of palpitations, chest pain, shortness of breath, orthopnea, PND, lower extremity edema, dizziness, presyncope, syncope, or neurologic sequela. The patient is tolerating medications without difficulties and is otherwise without complaint today.   Past Medical History:  Diagnosis Date  . A-fib ([place]) 11/2016   Tx with po meds at this time  . Anemia    Microcytic with Fe Sat 4% 10/31/10  . Atypical chest pain   . Cataract    removed bilaterally  . Chronic rhinitis   . Cough   . Diverticulosis   . GERD (gastroesophageal reflux disease)   . Hiatal hernia   . Hiatal hernia   . HLD (hyperlipidemia)   . HTN (hypertension)   . Interstitial lung disease ([place])   . Kidney stones   . Osteoarthritis   . Sarcoidosis ([place]) 8/99   Transbronchial biopsy, taking daily prednisone since  November 08 on and off since 1999 w/cough every time he relapses off it for more than several weeks  . Undescended right testicle    "shrivled" in  childhood from 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   Past Surgical History:  Procedure Laterality Date  . CARDIOVERSION N/A 12/21/2016   Procedure: CARDIOVERSION;  Surgeon: Evans Lance, MD;  Location: [facility];  Service: Cardiovascular;  Laterality: N/A;  . CATARACT EXTRACTION Left 10/14/2013  . CATARACT EXTRACTION Right 11/24/2013  . CERVICAL FUSION     pt states he did not have a cervical fusion, just a biopsy   . COLONOSCOPY  01/12/2006   tics only   . INGUINAL HERNIA REPAIR     right  . LIPOMA EXCISION Left 09/16/2004   forearm  . spinal cord biopsyOther]  1996   sarcoid    Current Outpatient Prescriptions  Medication Sig Dispense Refill  . Dietary Management Product (FOSTEUM PLUS) CAPS Take 1 capsule by mouth 2 (two) times daily. 60 capsule 11  . famotidine (PEPCID) 20 MG tablet Take 20 mg by mouth at bedtime.      . ferrous sulfate 325 (65 FE) MG tablet Take 1 tablet (325 mg total) by mouth 3 (three) times daily with meals. 90 tablet 4  . hydrOXYzine (ATARAX/VISTARIL) 10 MG tablet Take 1 tablet (10 mg total) by mouth 3 (three) times daily as needed. 30 tablet 0  . losartan (COZAAR) 100 MG tablet Take 1 tablet (100 mg total) by mouth daily. 90 tablet 3  . metoprolol  tartrate (LOPRESSOR) 25 MG tablet Take 1 tablet (25 mg total) by mouth 2 (two) times daily. 180 tablet 3  . Rivaroxaban (XARELTO) 15 MG TABS tablet Take 1 tablet (15 mg total) by mouth daily with supper. 90 tablet 1  . rosuvastatin (CRESTOR) 10 MG tablet take 1 tablet by mouth once daily 90 tablet 1  . vitamin B-12 (CYANOCOBALAMIN) 250 MCG tablet Take 250 mcg by mouth daily.      . Vitamin D, Ergocalciferol, (DRISDOL) 50000 units CAPS capsule 1 tab, twice a week (Patient taking differently: Take 50,000 Units by mouth every 7 (seven) days. 1 tab, twice a week) 12 capsule 3   No current facility-administered medications for this encounter.     Allergies  Allergen Reactions  . Amlodipine Other (See Comments)    edema  .  Welchol [Colesevelam Hcl]     Stomach cramps  . Other     Hair dye - facial swelling   . Prolia [Denosumab]     Pain at injection sight    Social History   Social History  . Marital status: Single    Spouse name: N/A  . Number of children: 0  . Years of education: N/A   Occupational History  . Retired [employer]  . Smoking status: Never Smoker  . Smokeless tobacco: Never Used  . Alcohol use No  . Drug use: No  . Sexual activity: Not Currently   Other Topics Concern  . Not on file   Social History Narrative   Regular Exercise -  NO    Family History  Problem Relation Age of Onset  . Asthma Mother   . Diabetes Mother   . Arthritis Paternal Grandmother   . Heart disease Paternal Grandmother   . Hypertension Father     Fathers family most  . Prostate cancer Father   . Kidney disease Father   . Colon polyps Sister   . Multiple myeloma Sister   . Heart disease Paternal Uncle   . Kidney disease Sister   . Sarcoidosis Neg Hx   . Other Neg Hx     osteoporosis  . Colon cancer Neg Hx   . Esophageal cancer Neg Hx   . Rectal cancer Neg Hx   . Stomach cancer Neg Hx     ROS- All systems are reviewed and negative except as per the HPI above  Physical Exam: Vitals:   01/29/17 0912  BP: (!) 156/92  Pulse: 84  Weight: 199 lb 6.4 oz (90.4 kg)  Height: '5\' 5"'  (1.651 m)   Wt Readings from Last 3 Encounters:  01/29/17 199 lb 6.4 oz (90.4 kg)  01/10/17 193 lb 9.6 oz (87.8 kg)  12/21/16 194 lb (88 kg)    Labs: Lab Results  Component Value Date   NA 142 01/29/2017   K 4.0 01/29/2017   CL 108 01/29/2017   CO2 23 01/29/2017   GLUCOSE 96 01/29/2017   BUN 16 01/29/2017   CREATININE 1.84 (H) 01/29/2017   CALCIUM 9.1 01/29/2017   MG 1.8 01/29/2017   Lab Results  Component Value Date   INR 1.1 12/14/2016   Lab Results  Component Value Date   CHOL 169 01/19/2016   HDL 66.70 01/19/2016   LDLCALC 86 01/19/2016   TRIG 79.0  01/19/2016     GEN- The patient is well appearing, alert and oriented x 3 today.   Head- normocephalic, atraumatic Eyes-  Sclera clear, conjunctiva pink Ears-  hearing intact Oropharynx- clear Neck- supple, no JVP Lymph- no cervical lymphadenopathy Lungs- Clear to ausculation bilaterally, normal work of breathing Heart- irregular rate and rhythm, no murmurs, rubs or gallops, PMI not laterally displaced GI- soft, NT, ND, + BS Extremities- no clubbing, cyanosis, or edema of both LLE into thighs, skin very taunt/shiny MS- no significant deformity or atrophy Skin- no rash or lesion Psych- euthymic mood, full affect Neuro- strength and sensation are intact  EKG-Afib at 84 bpm, qrs int 84 ms, qtc 465 ms Epic records reviewed    Assessment and Plan: 1. Persistent afib Admit for tikosyn admission today Will receive mag on admission for mag level of 1.8. K+ ok at 4.0 Aware of cost of drug and is agreeable Qtc acceptable on prior ekg's, mostly less than 465m(465 ms today) No QTC prolonging drugs on board No missed doses of xarelto x 3 weeks on appropriate dose of 15 mg daily Crcl calculated at 48.38 ml/min so he will have to be started at less than 500 mcg bid  DButch PennyC. Gemayel Mascio, AAlzada Hospital[address]GR[place]land [place] [phone]

## 2017-01-29 NOTE — Progress Notes (Signed)
Pharmacy Review for Dofetilide (Tikosyn) Initiation  Admit Complaint: 71 y.o. male admitted 01/29/2017 with atrial fibrillation to be initiated on dofetilide.   Assessment:  Patient Exclusion Criteria: If any screening criteria checked as "Yes", then  patient  should NOT receive dofetilide until criteria item is corrected. If "Yes" please indicate correction plan.  YES  NO Patient  Exclusion Criteria Correction Plan  []  []  Baseline QTc interval is greater than or equal to 440 msec. IF above YES box checked dofetilide contraindicated unless patient has ICD; then may proceed if QTc 500-550 msec or with known ventricular conduction abnormalities may proceed with QTc 550-600 msec. QTc =     []  [x]  Magnesium level is less than 1.8 mEq/l : Last magnesium:  Lab Results  Component Value Date   MG 1.8 01/29/2017         []  [x]  Potassium level is less than 4 mEq/l : Last potassium:  Lab Results  Component Value Date   K 4.0 01/29/2017         []  [x]  Patient is known or suspected to have a digoxin level greater than 2 ng/ml: No results found for: DIGOXIN    []  [x]  Creatinine clearance less than 20 ml/min (calculated using Cockcroft-Gault, actual body weight and serum creatinine): Estimated Creatinine Clearance: 39 mL/min (A) (by C-G formula based on SCr of 1.84 mg/dL (H)).    []  [x]  Patient has received drugs known to prolong the QT intervals within the last 48 hours (phenothiazines, tricyclics or tetracyclic antidepressants, erythromycin, H-1 antihistamines, cisapride, fluoroquinolones, azithromycin). Drugs not listed above may have an, as yet, undetected potential to prolong the QT interval, updated information on QT prolonging agents is available at this website:QT prolonging agents   []  [x]  Patient received a dose of hydrochlorothiazide (Oretic) alone or in any combination including triamterene (Dyazide, Maxzide) in the last 48 hours.   []  [x]  Patient received a medication known to  increase dofetilide plasma concentrations prior to initial dofetilide dose:  . Trimethoprim (Primsol, Proloprim) in the last 36 hours . Verapamil (Calan, Verelan) in the last 36 hours or a sustained release dose in the last 72 hours . Megestrol (Megace) in the last 5 days  . Cimetidine (Tagamet) in the last 6 hours . Ketoconazole (Nizoral) in the last 24 hours . Itraconazole (Sporanox) in the last 48 hours  . Prochlorperazine (Compazine) in the last 36 hours    []  [x]  Patient is known to have a history of torsades de pointes; congenital or acquired long QT syndromes.   []  [x]  Patient has received a Class 1 antiarrhythmic with less than 2 half-lives since last dose. (Disopyramide, Quinidine, Procainamide, Lidocaine, Mexiletine, Flecainide, Propafenone)   []  [x]  Patient has received amiodarone therapy in the past 3 months or amiodarone level is greater than 0.3 ng/ml.    Patient has been appropriately anticoagulated with Xarelto  Ordering provider was confirmed at [email or web address] if they are not listed on the [place] Prescribers list.  Goal of Therapy: Follow renal function, electrolytes, potential drug interactions, and dose adjustment. Provide education and 1 week supply at discharge.  Plan:  [x]   Physician selected initial dose within range recommended for patients level of renal function - will monitor for response.  []   Physician selected initial dose outside of range recommended for patients level of renal function - will discuss if the dose should be altered at this time.   Select One Calculated CrCl  Dose q12h  []  > 60 ml/min 500  mcg  [x]  40-60 ml/min 250 mcg  []  20-40 ml/min 125 mcg   2. Follow up QTc after the first 5 doses, renal function, electrolytes (K & Mg) daily x 3     days, dose adjustment, success of initiation and facilitate 1 week discharge supply as     clinically indicated.  3. Initiate Tikosyn education video (Call [phone] and ask for Tikosyn Video #  [id number]).  4. Place Enrollment Form on the chart for discharge supply of dofetilide.   [name] 2:31 PM 01/29/2017

## 2017-01-29 NOTE — H&P (Signed)
H&P    Patient ID: Richard Blake MRN: [id number], DOB/AGE: [birth year] 71 y.o.  Admit date: 01/29/2017 Date of visit: 01/29/2017  Primary Physician: Scarlette Calico, MD Primary Cardiologist: Dr. Lovena Le  Reason for visit: Tikosyn initiation  HPI: Richard Blake is a 71 y.o. male with PMHx of persistent AFib s/p DCCV w/ERAF, HTN, HLD, pulmonary/spinal cord sarcoid (treated with steroids, off for years), CRI (stage II-III)  He was seen by Dr. Lovena Le out patient and while not having overt palpitations feels his edema and exertional intolerances likely secondary to his AF and rhythm management was decided.  He underwent DCCV unfortunately with ERAF, given this was decided to pursue Tikosyn as AAD in effort to maintain SR.   The patient's current list of medicines were reviewed by [facility] RPH finding no contraindicated or QT prolonging meds, noting her recommendation to instruct patient not to use Benadryl, when I asked though, the patient states he does not use any OTC antihystamine/allergy relief medicines         Patient was given Tikosyn teaching at AF clinic, we reviewed again and he remains agreeable, as well as agreeable to DCCV after his 4th dose if not in SR.  Patient denies any missed doses of his xarelto in the last 3 weeks.  Patient is aware of his copay for Tikosyn  The patient is feeling well today, denies any kind of CP, no palpitations or SOB, he has some baseline DOE since about Nov 2017, this has been felt to be related to his AF.  He has known LE varicose veins with a small amount of end of the day swelling LE but this also since November has become much worse, started 12/19/16 on Torsemide but only took for 5 days because it wasn't working.  Past Medical History:  Diagnosis Date  . A-fib ([place]) 11/2016   Tx with po meds at this time  . Anemia    Microcytic with Fe Sat 4% 10/31/10  . Atypical chest pain   . Cataract    removed bilaterally  . Chronic rhinitis   . Cough   .  Diverticulosis   . GERD (gastroesophageal reflux disease)   . Hiatal hernia   . Hiatal hernia   . HLD (hyperlipidemia)   . HTN (hypertension)   . Interstitial lung disease ([place])   . Kidney stones   . Osteoarthritis   . Sarcoidosis ([place]) 8/99   Transbronchial biopsy, taking daily prednisone since  November 08 on and off since 1999 w/cough every time he relapses off it for more than several weeks  . Undescended right testicle    "shrivled" in childhood from 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     Surgical History:  Past Surgical History:  Procedure Laterality Date  . CARDIOVERSION N/A 12/21/2016   Procedure: CARDIOVERSION;  Surgeon: Evans Lance, MD;  Location: [facility];  Service: Cardiovascular;  Laterality: N/A;  . CATARACT EXTRACTION Left 10/14/2013  . CATARACT EXTRACTION Right 11/24/2013  . CERVICAL FUSION     pt states he did not have a cervical fusion, just a biopsy   . COLONOSCOPY  01/12/2006   tics only   . INGUINAL HERNIA REPAIR     right  . LIPOMA EXCISION Left 09/16/2004   forearm  . spinal cord biopsyOther]  1996   sarcoid     Prescriptions Prior to Admission  Medication Sig Dispense Refill Last Dose  . Dietary Management Product (FOSTEUM PLUS) CAPS Take 1 capsule by mouth 2 (two) times daily.  60 capsule 11 Taking  . famotidine (PEPCID) 20 MG tablet Take 20 mg by mouth at bedtime.     Taking  . ferrous sulfate 325 (65 FE) MG tablet Take 1 tablet (325 mg total) by mouth 3 (three) times daily with meals. 90 tablet 4 Taking  . hydrOXYzine (ATARAX/VISTARIL) 10 MG tablet Take 1 tablet (10 mg total) by mouth 3 (three) times daily as needed. 30 tablet 0 Taking  . losartan (COZAAR) 100 MG tablet Take 1 tablet (100 mg total) by mouth daily. 90 tablet 3 Taking  . metoprolol tartrate (LOPRESSOR) 25 MG tablet Take 1 tablet (25 mg total) by mouth 2 (two) times daily. 180 tablet 3 Taking  . Rivaroxaban (XARELTO) 15 MG TABS tablet Take 1 tablet (15 mg total) by mouth daily with supper. 90  tablet 1 Taking  . rosuvastatin (CRESTOR) 10 MG tablet take 1 tablet by mouth once daily 90 tablet 1 Taking  . vitamin B-12 (CYANOCOBALAMIN) 250 MCG tablet Take 250 mcg by mouth daily.     Taking  . Vitamin D, Ergocalciferol, (DRISDOL) 50000 units CAPS capsule 1 tab, twice a week (Patient taking differently: Take 50,000 Units by mouth every 7 (seven) days. 1 tab, twice a week) 12 capsule 3 Taking    Inpatient Medications:   Allergies:  Allergies  Allergen Reactions  . Amlodipine Other (See Comments)    edema  . Welchol [Colesevelam Hcl]     Stomach cramps  . Other     Hair dye - facial swelling   . Prolia [Denosumab]     Pain at injection sight    Social History   Social History  . Marital status: Single    Spouse name: N/A  . Number of children: 0  . Years of education: N/A   Occupational History  . Retired [place] [employer]  . Smoking status: Never Smoker  . Smokeless tobacco: Never Used  . Alcohol use No  . Drug use: No  . Sexual activity: Not Currently   Other Topics Concern  . Not on file   Social History Narrative   Regular Exercise -  NO     Family History  Problem Relation Age of Onset  . Asthma Mother   . Diabetes Mother   . Arthritis Paternal Grandmother   . Heart disease Paternal Grandmother   . Hypertension Father     Fathers family most  . Prostate cancer Father   . Kidney disease Father   . Colon polyps Sister   . Multiple myeloma Sister   . Heart disease Paternal Uncle   . Kidney disease Sister   . Sarcoidosis Neg Hx   . Other Neg Hx     osteoporosis  . Colon cancer Neg Hx   . Esophageal cancer Neg Hx   . Rectal cancer Neg Hx   . Stomach cancer Neg Hx      Review of Systems: All other systems reviewed and are otherwise negative except as noted above.  Physical Exam: Vitals:   01/29/17 1252 01/29/17 1306  BP: 137/82   Pulse: 75   Temp: 97.8 F (36.6 C)   TempSrc: Oral   SpO2: 100%   Weight:  197  lb 8.5 oz (89.6 kg)  Height:  '5\' 5"'  (1.651 m)    GEN- The patient is well appearing, alert and oriented x 3 today.   HEENT: normocephalic, atraumatic; sclera clear, conjunctiva pink; hearing intact; oropharynx clear; neck supple, no JVP  Lymph- no cervical lymphadenopathy Lungs- CTA b/l, normal work of breathing.  No wheezes, rales, rhonchi Heart- IRRR no murmurs, rubs or gallops, PMI not laterally displaced GI- soft, non-tender, non-distended Extremities- no clubbing, cyanosis, markedly edematous, 3+ b/l LE MS- no significant deformity or atrophy Skin- warm and dry, no rash or lesion Psych- euthymic mood, full affect Neuro- no gross deficits observed  Labs:   Lab Results  Component Value Date   WBC 4.4 12/14/2016   HGB 11.6 (L) 11/23/2016   HCT 34.0 (L) 12/14/2016   MCV 83 12/14/2016   PLT 298 12/14/2016     Recent Labs Lab 01/29/17 0852  NA 142  K 4.0  CL 108  CO2 23  BUN 16  CREATININE 1.84*  CALCIUM 9.1  GLUCOSE 96      Radiology/Studies: No results found.  Reviewed by myself: EKG: AFib, 84bpm, QRS 69m, QTc (measured by myself) is 4241mTELEMETRY: AFib, 80's   11/28/16: TTE Study Conclusions - Left ventricle: The cavity size was normal. Wall thickness was   normal. Systolic function was normal. The estimated ejection   fraction was in the range of 50% to 55%. Wall motion was normal;   there were no regional wall motion abnormalities. - Aortic valve: There was mild regurgitation. - Mitral valve: Calcified annulus. There was mild regurgitation. - Left atrium: The atrium was moderately to severely dilated. (4869m- Right ventricle: The cavity size was mildly dilated. Wall   thickness was normal. - Right atrium: The atrium was moderately dilated. - Tricuspid valve: There was severe regurgitation. - Pulmonary arteries: Systolic pressure was moderately increased.   PA peak pressure: 50 mm Hg (S).   Assessment and Plan:   1. Persistent AFib      CHA2DS2Vasc is 2, on xarelto (appropriately 61m79mily)     K+ 4.0     Mag 1.8 (will replace)     Creat 1.84 (Calc Cr. Cl = 48)     QTc acceptable to start     Tikosyn to start at 250mc96mD      DCCV on Wed if not in SR by then  2.  LE edema      Weight is up from last, 197lbs today (from 193-194)      He has not been taking his Demadex      Will diurese and monitor his renal function and lytes closely      Hopefully we are able to regain and mainitain SR       3. HTN     Follow    Signed, ReneeTommye StandardC 01/29/2017 1:38 PM  EP Attending  Patient seen and examined. Agree with the documentation above. The patient is a 70 yo37an with sarcoidosis and persistent atrial fib who failed DCCV who is admitted for initiation of Tikosyn. The patient On exam he is IRIR with minimal rales in the bases and 2-3+ peripheral edema. Tele/ECG reveals atrial fib with a normal QT. A/P 1. Persistent atrial fib - will admit for tikosyn. Follow labs and ECG's.  2. Volume overload - will give IV lasix. Follow electrolytes. 3. HTN heart disease - His blood pressure is reasonably well controlled. Will follow.  GreggMikle Bosworth

## 2017-01-30 LAB — BASIC METABOLIC PANEL
ANION GAP: 11 (ref 5–15)
BUN: 18 mg/dL (ref 6–20)
CALCIUM: 9.7 mg/dL (ref 8.9–10.3)
CO2: 25 mmol/L (ref 22–32)
CREATININE: 1.85 mg/dL — AB (ref 0.61–1.24)
Chloride: 105 mmol/L (ref 101–111)
GFR calc Af Amer: 41 mL/min — ABNORMAL LOW (ref >60)
GFR calc non Af Amer: 36 mL/min — ABNORMAL LOW (ref >60)
GLUCOSE: 85 mg/dL (ref 65–99)
Potassium: 4 mmol/L (ref 3.5–5.1)
Sodium: 141 mmol/L (ref 135–145)

## 2017-01-30 LAB — MAGNESIUM: MAGNESIUM: 2.2 mg/dL (ref 1.7–2.4)

## 2017-01-30 MED ORDER — TORSEMIDE 20 MG PO TABS
20.0000 mg | ORAL_TABLET | Freq: Every day | ORAL | Status: DC
Start: 2017-01-30 — End: 2017-01-30

## 2017-01-30 NOTE — Progress Notes (Signed)
Per Clinical biochemist for [employer]  @ [employer] RX # [phone]   TIKOSYN 250 MCG BID   COVER- NOT COVER  PRIOR APPROVAL- YES # [phone] FOR EXCEPTION   DOFETILIDE 25O MCG  COVER- YES  CO-PAY- 40 % OF TOTAL COAST -$ 144.18  TOTAL COST OF $360.46  TIER- 4 DRUG  PRIOR APPROVAL- NO

## 2017-01-30 NOTE — Care Management Note (Signed)
Case Management Note Marvetta Gibbons RN, BSN Unit 2W-Case Manager [phone]  Patient Details  Name: Richard Blake MRN: [id number] Date of Birth: [birth year]  Subjective/Objective:    Pt admitted with afib for Tikosyn load                Action/Plan: PTA pt lived at home- independent- anticipate return home- referral received for Tikosyn needs- per insurance check- copay 40% total cost of the drug- about $144/mo- spoke with pt at bedside with sister present with pt permission- insurance info shared- per pt he uses [facility] on [address]-(turning into [address] [facility]) - pharmacy will be able to order drug in stock once prescription has been sent- pt will need 7 day supply from [employer] on discharge.   Expected Discharge Date:    02/01/17              Expected Discharge Plan:  Home/Self Care  In-House Referral:     Discharge planning Services  CM Consult, Medication Assistance  Post Acute Care Choice:  NA Choice offered to:  NA  DME Arranged:    DME Agency:     HH Arranged:    HH Agency:     Status of Service:  Completed, signed off  If discussed at [place] of Stay Meetings, dates discussed:    Discharge Disposition: home/self care   Additional Comments:  Dawayne Patricia, RN 01/30/2017, 11:19 AM

## 2017-01-30 NOTE — Progress Notes (Signed)
SUBJECTIVE: The patient is doing well today.  At this time, he denies chest pain, shortness of breath, or any new concerns. Still with chronic LE edema but this has improved.   CURRENT MEDICATIONS: . dofetilide  250 mcg Oral BID  . famotidine  20 mg Oral QHS  . ferrous sulfate  325 mg Oral TID WC  . furosemide  40 mg Intravenous Q12H  . losartan  100 mg Oral Daily  . metoprolol tartrate  25 mg Oral BID  . multivitamin with minerals  1 tablet Oral BID  . potassium chloride  20 mEq Oral Daily  . rivaroxaban  15 mg Oral Q supper  . rosuvastatin  10 mg Oral QHS  . sodium chloride flush  3 mL Intravenous Q12H  . vitamin B-12  250 mcg Oral Daily  . [START ON 02/04/2017] Vitamin D (Ergocalciferol)  50,000 Units Oral Q7 days     OBJECTIVE: Physical Exam: Vitals:   01/29/17 1306 01/29/17 1956 01/29/17 2028 01/30/17 0424  BP:   123/90 (!) 137/95  Pulse:   85 77  Resp:   18 18  Temp:   98.5 F (36.9 C) 98.6 F (37 C)  TempSrc:   Oral Axillary  SpO2:   100% 96%  Weight: 197 lb 8.5 oz (89.6 kg)     Height: 5\' 5"  (1.651 m) 5\' 5"  (1.651 m)      Intake/Output Summary (Last 24 hours) at 01/30/17 0719 Last data filed at 01/30/17 0616  Gross per 24 hour  Intake              240 ml  Output             5000 ml  Net            -4760 ml    Telemetry reveals atrial fibrillation, rare PVC's (personally reviewed), QT is ok.  GEN- The patient is well appearing, alert and oriented x 3 today.   Head- normocephalic, atraumatic Eyes-  Sclera clear, conjunctiva pink Ears- hearing intact Oropharynx- clear Neck- supple  Lungs- Clear to ausculation bilaterally, normal work of breathing Heart- Irregular rate and rhythm  GI- soft, NT, ND, + BS Extremities- no clubbing, cyanosis, 3+BLE edema to thighs Skin- no rash or lesion Psych- euthymic mood, full affect Neuro- strength and sensation are intact  LABS: Basic Metabolic Panel:  Recent Labs  01/29/17 0852 01/29/17 1802 01/30/17 0336   NA 142  --  141  K 4.0  --  4.0  CL 108  --  105  CO2 23  --  25  GLUCOSE 96  --  85  BUN 16  --  18  CREATININE 1.84*  --  1.85*  CALCIUM 9.1  --  9.7  MG 1.8 2.3 2.2    ASSESSMENT AND PLAN:  Active Problems:   Visit for monitoring Tikosyn therapy  1.  Persistent atrial fibrillation Admitted for Tikosyn 01/29/17 Continue Xarelto long term for CHADS2VASC of 2 BMET, Mg, QTc stable today Plan DCCV tomorrow if still in AF NPO after midnight tonight  2.  HTN Stable No change required today  3.  Chronic diastolic heart failure Still significantly volume overloaded Should improve with sinus rhythm  Will continue IV Lasix and follow labs  Chanetta Marshall, NP 01/30/2017 7:21 AM  EP Attending  Patient seen and examined. Agree with above. He is stable this morning but still remains in atrial fib. His QT is ok. His legs are less edematous. He  states his weight went from 195 to 185 though this is not yet documented. He will continue IV lasix for now. Plan for DCCV tomorrow if he has not reverted to NSR.  Mikle Bosworth.D.

## 2017-01-30 NOTE — Consult Note (Signed)
[facility] Primary Care Navigator  01/30/2017  Richard Blake May 25, 1946 [id number]   Met with patient at the bedside to identify possible discharge needs. Patient reports having persistent "irregular heartbeats" that had led to this admission.  Patient endorses Dr. Scarlette Calico with [facility] [place] as his primary care provider.   Patient shared using [facility] (merging with [facility]) to obtain medications without any problem.   Patient reports managing his own medications at home using "pill box" system weekly.   He states being independent with self care and able to drive prior to admission. Sister Bertram Millard) can provide transportation to his doctors' appointments if needed.  He provides care for himself at home but his sister can assist when needed per patient.  Discharge plan is home per patient.  Patient voiced understanding to call primary care provider's office when he gets home, for a post discharge follow-up appointment within a week or sooner if needs arise. Patient letter (with PCP's contact number) was provided as a reminder.  He communicated no other health management needs or concerns at this time.  For questions, please contact:  Dannielle Huh, BSN, RN- [facility] Primary Care Navigator  Telephone: [phone] [place]

## 2017-01-31 ENCOUNTER — Inpatient Hospital Stay (HOSPITAL_COMMUNITY): Payer: Medicare Other | Admitting: Anesthesiology

## 2017-01-31 ENCOUNTER — Encounter (HOSPITAL_COMMUNITY)
Admission: AD | Disposition: A | Discharge status: Home / Self Care | Payer: Self-pay | Source: Ambulatory Visit | Attending: Internal Medicine

## 2017-01-31 ENCOUNTER — Encounter (HOSPITAL_COMMUNITY): Payer: Self-pay | Admitting: *Deleted

## 2017-01-31 DIAGNOSIS — I5032 Chronic diastolic (congestive) heart failure: Secondary | ICD-10-CM

## 2017-01-31 HISTORY — PX: CARDIOVERSION: SHX1299

## 2017-01-31 LAB — BASIC METABOLIC PANEL
ANION GAP: 12 (ref 5–15)
BUN: 20 mg/dL (ref 6–20)
CHLORIDE: 101 mmol/L (ref 101–111)
CO2: 30 mmol/L (ref 22–32)
CREATININE: 2.05 mg/dL — AB (ref 0.61–1.24)
Calcium: 9.9 mg/dL (ref 8.9–10.3)
GFR calc non Af Amer: 31 mL/min — ABNORMAL LOW (ref >60)
GFR, EST AFRICAN AMERICAN: 36 mL/min — AB (ref >60)
Glucose, Bld: 88 mg/dL (ref 65–99)
Potassium: 3.8 mmol/L (ref 3.5–5.1)
SODIUM: 143 mmol/L (ref 135–145)

## 2017-01-31 LAB — MAGNESIUM: Magnesium: 2 mg/dL (ref 1.7–2.4)

## 2017-01-31 SURGERY — CARDIOVERSION
Anesthesia: General

## 2017-01-31 MED ORDER — SODIUM CHLORIDE 0.9% FLUSH: 3.0000 mL | INTRAVENOUS | Status: DC | PRN

## 2017-01-31 MED ORDER — SODIUM CHLORIDE 0.9 % IV SOLN: 250.0000 mL | INTRAVENOUS | Status: DC

## 2017-01-31 MED ORDER — SODIUM CHLORIDE 0.9% FLUSH
3.0000 mL | Freq: Two times a day (BID) | INTRAVENOUS | Status: DC
  Administered 2017-01-31 – 2017-02-01 (×3): 3 mL via INTRAVENOUS

## 2017-01-31 MED ORDER — SODIUM CHLORIDE 0.9 % IV SOLN
INTRAVENOUS | Status: DC
  Administered 2017-01-31 (×2): via INTRAVENOUS

## 2017-01-31 MED ORDER — LIDOCAINE HCL (CARDIAC) 20 MG/ML IV SOLN
INTRAVENOUS | Status: DC | PRN
  Administered 2017-01-31: 100 mg via INTRATRACHEAL

## 2017-01-31 MED ORDER — PROPOFOL 10 MG/ML IV BOLUS
INTRAVENOUS | Status: DC | PRN
  Administered 2017-01-31: 100 mg via INTRAVENOUS

## 2017-01-31 MED ORDER — POTASSIUM CHLORIDE CRYS ER 20 MEQ PO TBCR
20.0000 meq | EXTENDED_RELEASE_TABLET | Freq: Once | ORAL | Status: AC
  Administered 2017-01-31: 20 meq via ORAL
  Filled 2017-01-31: qty 1

## 2017-01-31 NOTE — H&P (Signed)
    INTERVAL PROCEDURE H&P  History and Physical Interval Note:  01/31/2017 1:37 PM  Richard Blake has presented today for their planned procedure. The various methods of treatment have been discussed with the patient and family. After consideration of risks, benefits and other options for treatment, the patient has consented to the procedure.  The patients' outpatient history has been reviewed, patient examined, and no change in status from most recent office note within the past 30 days. I have reviewed the patients' chart and labs and will proceed as planned. Questions were answered to the patient's satisfaction.   Pixie Casino, MD, [facility] Attending Cardiologist [place] Gita Dilger 01/31/2017, 1:37 PM

## 2017-01-31 NOTE — Anesthesia Preprocedure Evaluation (Signed)
Anesthesia Evaluation  Patient identified by MRN, date of birth, ID band Patient awake    Reviewed: Allergy & Precautions, NPO status , Patient's Chart, lab work & pertinent test results  Airway Mallampati: II  TM Distance: >3 FB Neck ROM: Full    Dental no notable dental hx.    Pulmonary  sarcoidosis   Pulmonary exam normal breath sounds clear to auscultation       Cardiovascular hypertension, Pt. on medications Normal cardiovascular exam+ dysrhythmias Atrial Fibrillation  Rhythm:Regular Rate:Normal     Neuro/Psych negative neurological ROS  negative psych ROS   GI/Hepatic negative GI ROS, Neg liver ROS,   Endo/Other  negative endocrine ROS  Renal/GU negative Renal ROS  negative genitourinary   Musculoskeletal negative musculoskeletal ROS (+)   Abdominal   Peds negative pediatric ROS (+)  Hematology negative hematology ROS (+)   Anesthesia Other Findings   Reproductive/Obstetrics negative OB ROS                             Anesthesia Physical Anesthesia Plan  ASA: III  Anesthesia Plan: General   Post-op Pain Management:    Induction: Intravenous  Airway Management Planned: Mask  Additional Equipment:   Intra-op Plan:   Post-operative Plan:   Informed Consent: I have reviewed the patients History and Physical, chart, labs and discussed the procedure including the risks, benefits and alternatives for the proposed anesthesia with the patient or authorized representative who has indicated his/her understanding and acceptance.   Dental advisory given  Plan Discussed with: CRNA  Anesthesia Plan Comments:         Anesthesia Quick Evaluation

## 2017-01-31 NOTE — Anesthesia Postprocedure Evaluation (Signed)
Anesthesia Post Note  Patient: Richard Blake  Procedure(s) Performed: Procedure(s) (LRB): CARDIOVERSION (N/A)  Patient location during evaluation: Endoscopy Anesthesia Type: General Level of consciousness: awake and alert and oriented Pain management: pain level controlled Vital Signs Assessment: post-procedure vital signs reviewed and stable Respiratory status: spontaneous breathing Cardiovascular status: blood pressure returned to baseline Anesthetic complications: no       Last Vitals:  Vitals:   01/31/17 0528 01/31/17 1255  BP: 134/80 130/74  Pulse: 89 80  Resp: 18 20  Temp: 36.4 C 36.8 C    Last Pain:  Vitals:   01/31/17 1255  TempSrc: Oral  PainSc:                  Clearnce Sorrel

## 2017-01-31 NOTE — Progress Notes (Signed)
SUBJECTIVE: The patient is doing well today.  At this time, he denies chest pain, shortness of breath, or any new concerns. Still with chronic LE edema but this has improved.   CURRENT MEDICATIONS: . dofetilide  250 mcg Oral BID  . famotidine  20 mg Oral QHS  . ferrous sulfate  325 mg Oral TID WC  . furosemide  40 mg Intravenous Q12H  . losartan  100 mg Oral Daily  . metoprolol tartrate  25 mg Oral BID  . multivitamin with minerals  1 tablet Oral BID  . potassium chloride  20 mEq Oral Daily  . rivaroxaban  15 mg Oral Q supper  . rosuvastatin  10 mg Oral QHS  . sodium chloride flush  3 mL Intravenous Q12H  . vitamin B-12  250 mcg Oral Daily  . [START ON 02/04/2017] Vitamin D (Ergocalciferol)  50,000 Units Oral Q7 days     OBJECTIVE: Physical Exam: Vitals:   01/30/17 0424 01/30/17 1334 01/30/17 2034 01/31/17 0528  BP: (!) 137/95 115/78 122/72 134/80  Pulse: 77 75 84 89  Resp: 18 20 18 18   Temp: 98.6 F (37 C) 98.2 F (36.8 C) 98.4 F (36.9 C) 97.6 F (36.4 C)  TempSrc: Axillary Oral Oral Oral  SpO2: 96% 100% 100% 100%  Weight:      Height:        Intake/Output Summary (Last 24 hours) at 01/31/17 0859 Last data filed at 01/31/17 0830  Gross per 24 hour  Intake              480 ml  Output             3876 ml  Net            -3396 ml   Reviewed by myself, d/w Dr. Lovena Le Telemetry reveals atrial fibrillation, occ PVC/couplets  GEN- The patient is well appearing, alert and oriented x 3 today.   Head- normocephalic, atraumatic Eyes-  Sclera clear, conjunctiva pink Ears- hearing intact Oropharynx- clear Neck- supple  Lungs- CTA b/l, normal work of breathing Heart- Irregular rate and rhythm  GI- soft, NT, ND Extremities- no clubbing, cyanosis, 3+BLE edema below the knees now, less tense Skin- no rash or lesion Psych- euthymic mood, full affect Neuro- strength and sensation are intact  LABS: Basic Metabolic Panel:  Recent Labs  01/30/17 0336 01/31/17 0451   NA 141 143  K 4.0 3.8  CL 105 101  CO2 25 30  GLUCOSE 85 88  BUN 18 20  CREATININE 1.85* 2.05*  CALCIUM 9.7 9.9  MG 2.2 2.0    ASSESSMENT AND PLAN:  Active Problems:   Visit for monitoring Tikosyn therapy  1.  Persistent atrial fibrillation Admitted for Tikosyn 01/29/17 Continue Xarelto long term for CHADS2VASC of 2, EKG is reviewed by Dr. Lovena Le, [place] to proceed, follow up in SR Additional K+ ordered Mag stable Creat up some, Clearance 43 Plan DCCV today  2.  HTN Stable No change required today  3.  Chronic diastolic heart failure Still significantly volume overloaded Fluid negative -7945ml (asked staff to weigh) Should improve with sinus rhythm  Will continue IV Lasix today and follow labs  Tommye Standard, Hershal Coria 01/31/2017 8:59 AM  EP Attending  Patient seen and examined. Agree with above. He remains in atrial fib. He will undergo DCCV later today. He will continue IV lasix today. I would anticipate switching back to oral lasix tomorrow. Will review his QT after he returns to NSR.  Mikle Bosworth.D.

## 2017-01-31 NOTE — Transfer of Care (Signed)
Immediate Anesthesia Transfer of Care Note  Patient: Richard Blake  Procedure(s) Performed: Procedure(s): CARDIOVERSION (N/A)  Patient Location: Endoscopy Unit  Anesthesia Type:General  Level of Consciousness: awake, alert  and oriented  Airway & Oxygen Therapy: Patient Spontanous Breathing  Post-op Assessment: Report given to RN and Post -op Vital signs reviewed and stable  Post vital signs: Reviewed and stable  Last Vitals:  Vitals:   01/31/17 0528 01/31/17 1255  BP: 134/80 130/74  Pulse: 89 80  Resp: 18 20  Temp: 36.4 C 36.8 C    Last Pain:  Vitals:   01/31/17 1255  TempSrc: Oral  PainSc:          Complications: No apparent anesthesia complications

## 2017-01-31 NOTE — CV Procedure (Signed)
    CARDIOVERSION NOTE  Procedure: Electrical Cardioversion Indications:  Atrial Fibrillation  Procedure Details:  Consent: Risks of procedure as well as the alternatives and risks of each were explained to the (patient/caregiver).  Consent for procedure obtained.  Time Out: Verified patient identification, verified procedure, site/side was marked, verified correct patient position, special equipment/implants available, medications/allergies/relevent history reviewed, required imaging and test results available.  Performed  Patient placed on cardiac monitor, pulse oximetry, supplemental oxygen as necessary.  Sedation given: Propofol per anesthesia Pacer pads placed anterior and posterior chest.  Cardioverted 3 time(s).  Cardioverted at 150J, 200J and 200J with pressure applied on the pads  Impression: Findings: Post procedure EKG shows: Atrial Fibrillation Complications: None Patient did tolerate procedure well.  Plan: 1. Unsuccessful cardioversion with only 1 or 2 post-conversion sinus beats and then recurrent atrial fibrillation. Further management per Dr. Lovena Le and cardiac EP. 12-lead EKG ordered to re-assess QTc interval.  Time Spent Directly with the Patient:  30 minutes   Pixie Casino, MD, [facility] Attending Cardiologist [place] 01/31/2017, 2:13 PM

## 2017-02-01 ENCOUNTER — Telehealth: Payer: Self-pay | Admitting: *Deleted

## 2017-02-01 ENCOUNTER — Telehealth: Payer: Self-pay | Admitting: Physician Assistant

## 2017-02-01 LAB — BASIC METABOLIC PANEL
Anion gap: 12 (ref 5–15)
BUN: 21 mg/dL — AB (ref 6–20)
CO2: 30 mmol/L (ref 22–32)
Calcium: 9.7 mg/dL (ref 8.9–10.3)
Chloride: 101 mmol/L (ref 101–111)
Creatinine, Ser: 2.19 mg/dL — ABNORMAL HIGH (ref 0.61–1.24)
GFR calc Af Amer: 34 mL/min — ABNORMAL LOW (ref >60)
GFR, EST NON AFRICAN AMERICAN: 29 mL/min — AB (ref >60)
GLUCOSE: 90 mg/dL (ref 65–99)
POTASSIUM: 3.9 mmol/L (ref 3.5–5.1)
Sodium: 143 mmol/L (ref 135–145)

## 2017-02-01 LAB — MAGNESIUM: Magnesium: 2.1 mg/dL (ref 1.7–2.4)

## 2017-02-01 MED ORDER — TORSEMIDE 20 MG PO TABS
20.0000 mg | ORAL_TABLET | Freq: Two times a day (BID) | ORAL | 3 refills | Status: DC
Start: 2017-02-01 — End: 2017-11-07

## 2017-02-01 NOTE — Progress Notes (Signed)
Pt's QTc is 504.  On- call cardiologist informed.  No new orders at this time.  Will continue to monitor pt.  Lupita Dawn, RN

## 2017-02-01 NOTE — Telephone Encounter (Signed)
Pt was on TCM list admitted 3/19 for Persistent atrial fibrillation status post Tikosyn loading this admission      CHA2DS2Vasc is 2 on Xarelto, and Acute/chronic CHF exacerbation  s/p diuresis. Pt was D/C 3/22, and will  f/u with cardiology Bhagat,Bhavinkumar, [place]  on 02/08/2017....Johny Chess

## 2017-02-01 NOTE — Telephone Encounter (Signed)
Called the patient to instruct him not to start the torsemide until tomorrow.  He had not taken any at home and will hold off until tomorrow. I had not included this direction on his discharge.  He stated understanding.  Tommye Standard, PA-C

## 2017-02-01 NOTE — Progress Notes (Signed)
[name] to be D/C'd Home per MD order. Discussed with the patient and all questions fully answered.    VVS, Skin clean, dry and intact without evidence of skin break down, no evidence of skin tears noted.  IV catheter discontinued intact. Site without signs and symptoms of complications. Dressing and pressure applied.  An After Visit Summary was printed and given to the patient.  Patient escorted via [place], and D/C home via private auto.  Cyndra Numbers  02/01/2017 1:02 PM

## 2017-02-01 NOTE — Discharge Summary (Signed)
ELECTROPHYSIOLOGY PROCEDURE DISCHARGE SUMMARY    Patient ID: Richard Blake,  MRN: [id number], DOB/AGE: 1946/08/01 71 y.o.  Admit date: 01/29/2017 Discharge date: 02/01/2017  Primary Care Physician: Scarlette Calico, MD  Primary Cardiologist/Electrophysiologist: Dr. Lovena Le  Primary Discharge Diagnosis:  1.  Persistent atrial fibrillation status post Tikosyn loading this admission      CHA2DS2Vasc is 2 on Xarelto (appropriate lower dose) 2. Acute/chronic CHF exacerbation     s/p diuresis  Secondary Discharge Diagnosis:  1. HTN 2. HLD 3. Pulmonary sarcoid 4. CRI (stage III)  Allergies  Allergen Reactions  . Amlodipine Other (See Comments)    Edema   . Welchol [Colesevelam Hcl] Other (See Comments)    Stomach cramps  . Other Swelling    Hair dye - facial swelling   . Prolia [Denosumab] Other (See Comments)    Pain at injection sight     Procedures This Admission:  1.  Tikosyn loading 2.  Direct current cardioversion on 01/31/17 by Dr Debara Pickett which was unsuccessful.        There were no early apparent complications.   Brief HPI: Richard Blake is a 71 y.o. male with a past medical history as noted above.  He was seen in the outpatient setting for treatment options of atrial fibrillation.  Risks, benefits, and alternatives to Tikosyn were reviewed with the patient who wished to proceed.  He was also noted to have marked fluid OL felt secondary to his AF most likely.  Hospital Course:  The patient was admitted and Tikosyn and IV diuresis was initiated.  Renal function and electrolytes were followed during the hospitalization and replaced as neccessary.  His QTc remained stable.  On 01/31/17 they underwent direct current cardioversion which was unfortunately unsuccessful in restoring sinus rhythm.  On the day of discharge his edema was much improved, fluid - 9831ml and -23 pounds, he was monitored until discharge on telemetry which demonstrated persistent AFib, rates 50's  overnight generally 80-90's daytime with intermittent faster rates.  On the day of discharge, he was examined by Dr Lovena Le who considered them stable for discharge to home, his AF likely permanent and r ate control strategy is current POC going forward.   Early follow-up has been arranged for a visit and BMET, we will resume demadex .  Lengthy d/w the patient regarding daily weights, Na restriction.  Physical Exam: Vitals:   01/31/17 1425 01/31/17 1430 01/31/17 2139 02/01/17 0516  BP: 127/87 (!) 129/91 125/78 119/78  Pulse: (!) 55 77 81 94  Resp: (!) 22 20 18 20   Temp:   98.5 F (36.9 C) 97.6 F (36.4 C)  TempSrc:   Oral Oral  SpO2: 97% 99% 97% 99%  Weight:      Height:        GEN- The patient is well appearing, alert and oriented x 3 today.   HEENT: normocephalic, atraumatic; sclera clear, conjunctiva pink; hearing intact; oropharynx clear; neck supple, no JVP Lymph- no cervical lymphadenopathy Lungs- CTA b/l, normal work of breathing.  No wheezes, rales, rhonchi Heart- IRRR, no murmurs, rubs or gallops, PMI not laterally displaced GI- soft, non-tender, non-distended Extremities- no clubbing, cyanosis, 1++ edema MS- no significant deformity or atrophy Skin- warm and dry, no rash or lesion Psych- euthymic mood, full affect Neuro- strength and sensation are intact   Labs:   Lab Results  Component Value Date   WBC 4.4 12/14/2016   HGB 11.6 (L) 11/23/2016   HCT 34.0 (L) 12/14/2016  MCV 83 12/14/2016   PLT 298 12/14/2016     Recent Labs Lab 02/01/17 0230  NA 143  K 3.9  CL 101  CO2 30  BUN 21*  CREATININE 2.19*  CALCIUM 9.7  GLUCOSE 90     Discharge Medications:  Allergies as of 02/01/2017      Reactions   Amlodipine Other (See Comments)   Edema   Welchol [colesevelam Hcl] Other (See Comments)   Stomach cramps   Other Swelling   Hair dye - facial swelling    Prolia [denosumab] Other (See Comments)   Pain at injection sight      Medication List    TAKE  these medications   acetaminophen 500 MG tablet Commonly known as:  TYLENOL Take 500-1,000 mg by mouth every 6 (six) hours as needed (for pain from muscle spasms).   famotidine 20 MG tablet Commonly known as:  PEPCID Take 20 mg by mouth at bedtime as needed for heartburn or indigestion.   ferrous sulfate 325 (65 FE) MG tablet Take 1 tablet (325 mg total) by mouth 3 (three) times daily with meals. What changed:  when to take this  additional instructions   FOSTEUM PLUS Caps Take 1 capsule by mouth 2 (two) times daily.   hydrOXYzine 10 MG tablet Commonly known as:  ATARAX/VISTARIL Take 1 tablet (10 mg total) by mouth 3 (three) times daily as needed.   losartan 100 MG tablet Commonly known as:  COZAAR Take 1 tablet (100 mg total) by mouth daily.   metoprolol tartrate 25 MG tablet Commonly known as:  LOPRESSOR Take 1 tablet (25 mg total) by mouth 2 (two) times daily. Notes to patient:  You may take an additional tablet daily if needed for palpitations/fast heart beat   Rivaroxaban 15 MG Tabs tablet Commonly known as:  XARELTO Take 1 tablet (15 mg total) by mouth daily with supper.   rosuvastatin 10 MG tablet Commonly known as:  CRESTOR take 1 tablet by mouth once daily   torsemide 20 MG tablet Commonly known as:  DEMADEX Take 1 tablet (20 mg total) by mouth 2 (two) times daily.   vitamin B-12 250 MCG tablet Commonly known as:  CYANOCOBALAMIN Take 250 mcg by mouth daily.   Vitamin D (Ergocalciferol) 50000 units Caps capsule Commonly known as:  DRISDOL 1 tab, twice a week What changed:  how much to take  how to take this  when to take this  additional instructions       Disposition: Home Discharge Instructions    Diet - low sodium heart healthy    Complete by:  As directed    Increase activity slowly    Complete by:  As directed      Follow-up Information    Bhagat,Bhavinkumar, PA Follow up on 02/08/2017.   Specialty:  Cardiology Why:   8:30AM Contact information: [place] [address] [phone]           Duration of Discharge Encounter: Greater than 30 minutes including physician time.  Venetia Night, PA-C 02/01/2017 12:06 PM  EP Attending  Patient seen and examined. Agree with above. He is stable though could not be maintained in NSR. His CHF is much improved after IV diuresis.   Mikle Bosworth.D.

## 2017-02-05 NOTE — Progress Notes (Signed)
Cardiology Office Note    Date:  02/08/2017   ID:  ADMIR CANDELAS, DOB 01/27/1946, MRN [id number]  PCP:  Scarlette Calico, MD  Cardiologist/Electrophysiologist: Dr. Lovena Le  Chief Complaint: Hospital follow up for afib   History of Present Illness:   DAXON KYNE is a 71 y.o. 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   He was seen by Dr. Lovena Le out patient and while not having overt palpitations feels his edema and exertional intolerances likely secondary to his AF and rhythm management was decided.  He underwent DCCV unfortunately with ERAF, given this was decided to pursue Tikosyn as AAD in effort to maintain SR.   The patient was admitted 01/29/17 for Tikosyn load.  On 01/31/17 they underwent direct current cardioversion which was unfortunately unsuccessful in restoring sinus rhythm. Fluid negative 9.8L with 23 weight loss on IV diuresis. Felt rate control strategy going forward.   Here for follow up. Rare Intermittent palpitation that last for less than 10 minutes. He has gained 2 pounds overnight, however did not took his diuretic yesterday due to work schedule. Denies orthopnea, PND, syncope, dizziness, chest pain, melena or blood in his stool or urine. Baseline weight of  175lb on home scale.  Past Medical History:  Diagnosis Date  . A-fib ([place]) 11/2016   Tx with po meds at this time  . Anemia    Microcytic with Fe Sat 4% 10/31/10  . Atypical chest pain   . Cataract    removed bilaterally  . Chronic rhinitis   . Cough   . Diverticulosis   . Dyspnea    " WITH MY AFIB "  . GERD (gastroesophageal reflux disease)   . Hiatal hernia   . Hiatal hernia   . HLD (hyperlipidemia)   . HTN (hypertension)   . Interstitial lung disease ([place])   . Kidney stones     . Osteoarthritis   . Sarcoidosis ([employer]) 8/99   Transbronchial biopsy, taking daily prednisone since  November 08 on and off since 1999 w/cough every time he relapses off it for more than several weeks  . 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 CARDIOVERSION N/A 12/21/2016   Procedure: CARDIOVERSION;  Surgeon: Evans Lance, MD;  Location: [facility];  Service: Cardiovascular;  Laterality: N/A;  . CARDIOVERSION N/A 01/31/2017   Procedure: CARDIOVERSION;  Surgeon: Pixie Casino, MD;  Location: [facility];  Service: Cardiovascular;  Laterality: N/A;  . CATARACT EXTRACTION Left 10/14/2013  . CATARACT EXTRACTION Right 11/24/2013  . CERVICAL FUSION     pt states he did not have a cervical fusion, just a biopsy   . COLONOSCOPY  01/12/2006   tics only   . INGUINAL HERNIA REPAIR     right  . LIPOMA EXCISION Left 09/16/2004   forearm  . spinal cord biopsyOther]  1996   sarcoid    Current Medications: Prior to Admission medications   Medication Sig Start Date End Date Taking? Authorizing Provider  acetaminophen (TYLENOL) 500 MG tablet Take 500-1,000 mg by mouth every 6 (six) hours as needed (for pain from muscle spasms).    Historical Provider, MD  Dietary Management Product (FOSTEUM PLUS) CAPS Take 1 capsule by  mouth 2 (two) times daily. 01/19/16   Janith Lima, MD  famotidine (PEPCID) 20 MG tablet Take 20 mg by mouth at bedtime as needed for heartburn or indigestion.     Historical Provider, MD  ferrous sulfate 325 (65 FE) MG tablet Take 1 tablet (325 mg total) by mouth 3 (three) times daily with meals. Patient taking differently: Take 325 mg by mouth See admin instructions. One to two times a day 12/19/16   Janith Lima, MD  hydrOXYzine (ATARAX/VISTARIL) 10 MG tablet Take 1 tablet (10 mg total) by mouth 3 (three) times daily as needed. Patient not taking: Reported on 01/29/2017 02/23/16   Janith Lima, MD  losartan (COZAAR) 100 MG tablet Take 1 tablet (100 mg total) by mouth daily. 11/02/15   Janith Lima, MD  metoprolol tartrate (LOPRESSOR) 25 MG tablet Take 1 tablet (25 mg total) by mouth 2 (two) times daily. 11/28/16 02/26/17  Leanor Kail, PA  Rivaroxaban (XARELTO) 15 MG TABS tablet Take 1 tablet (15 mg total) by mouth daily with supper. 12/19/16   Janith Lima, MD  rosuvastatin (CRESTOR) 10 MG tablet take 1 tablet by mouth once daily 01/18/17   Janith Lima, MD  torsemide (DEMADEX) 20 MG tablet Take 1 tablet (20 mg total) by mouth 2 (two) times daily. 02/01/17   Renee Dyane Dustman, PA-C  vitamin B-12 (CYANOCOBALAMIN) 250 MCG tablet Take 250 mcg by mouth daily.      Historical Provider, MD  Vitamin D, Ergocalciferol, (DRISDOL) 50000 units CAPS capsule 1 tab, twice a week Patient taking differently: Take 50,000 Units by mouth 2 (two) times a week. MONDAYS AND THURSDAYS (usually) 02/23/16   Janith Lima, MD    Allergies:   Amlodipine; Welchol [colesevelam hcl]; Other; and Prolia [denosumab]   Social History   Social History  . Marital status: Single    Spouse name: N/A  . Number of children: 0  . Years of education: N/A   Occupational History  . Retired [employer]  . Smoking status: Never Smoker  . Smokeless tobacco: Never Used  . Alcohol use No  . Drug use: No  . Sexual activity: Not Currently   Other Topics Concern  . None   Social History Narrative   Regular Exercise -  NO     Family History:  The patient's family history includes Arthritis in his paternal grandmother; Asthma in his mother; Colon polyps in his sister; Diabetes in his mother; Heart disease in his paternal grandmother and paternal uncle; Hypertension in his father; Kidney disease in his father and sister; Multiple myeloma in his sister; Prostate cancer in his father.   ROS:   Please see the history of present illness.    ROS All other systems reviewed and  are negative.   PHYSICAL EXAM:   VS:  BP 120/82   Pulse 64   Ht '5\' 5"'  (1.651 m)   SpO2 98%    GEN: Well nourished, well developed, in no acute distress  HEENT: normal  Neck: no JVD, carotid bruits, or masses Cardiac: Ir Ir ; no murmurs, rubs, or gallops. 1+ BL LE  edema  Respiratory:  clear to auscultation bilaterally, normal work of breathing GI: soft, nontender, nondistended, + BS MS: no deformity or atrophy  Skin: warm and dry, no rash Neuro:  Alert and Oriented x 3, Strength and sensation are intact Psych: euthymic mood, full affect  Wt Readings from Last 3  Encounters:  01/31/17 174 lb 1.6 oz (79 kg)  01/29/17 199 lb 6.4 oz (90.4 kg)  01/10/17 193 lb 9.6 oz (87.8 kg)      Studies/Labs Reviewed:   EKG:  EKG is ordered today.  The ekg ordered today demonstrates afib rate of  bpm.   Recent Labs: 11/23/2016: ALT 21; Hemoglobin 11.6; Pro B Natriuretic peptide (BNP) 281.0; TSH 0.76 12/14/2016: Platelets 298 02/01/2017: BUN 21; Creatinine, Ser 2.19; Magnesium 2.1; Potassium 3.9; Sodium 143   Lipid Panel    Component Value Date/Time   CHOL 169 01/19/2016 1358   TRIG 79.0 01/19/2016 1358   HDL 66.70 01/19/2016 1358   CHOLHDL 3 01/19/2016 1358   VLDL 15.8 01/19/2016 1358   LDLCALC 86 01/19/2016 1358   LDLDIRECT 186.5 10/23/2012 1450    Additional studies/ records that were reviewed today include:   Echocardiogram: 11/28/16 Study Conclusions  - Left ventricle: The cavity size was normal. Wall thickness was   normal. Systolic function was normal. The estimated ejection   fraction was in the range of 50% to 55%. Wall motion was normal;   there were no regional wall motion abnormalities. - Aortic valve: There was mild regurgitation. - Mitral valve: Calcified annulus. There was mild regurgitation. - Left atrium: The atrium was moderately to severely dilated. - Right ventricle: The cavity size was mildly dilated. Wall   thickness was normal. - Right atrium: The atrium was  moderately dilated. - Tricuspid valve: There was severe regurgitation. - Pulmonary arteries: Systolic pressure was moderately increased.   PA peak pressure: 50 mm Hg (S).    ASSESSMENT & PLAN:    1. Persistent atrial fibrillation ( Failed DCCV x 2, second on Tikosyn) -  Now on rate control strategy. Rate is stable on metoprolol.  Asymptomatic. Advised to take extra 12.5 mg of metoprolol as needed for palpitations lasting for greater than 15 minutes. Continue XARELTO.   2.  HTN - Well-controlled on current regimen.  3. HLD - Continue statin.   4. 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 Weight up 2 pounds today. He will take as soon as he goes to  home. No orthopnea, PND, syncope or dizziness. Compliant with low sodium diet. Try compression stockings and elevation of leg. Discussed importance of compliance with diuretics.     Medication Adjustments/Labs and Tests Ordered: Current medicines are reviewed at length with the patient today.  Concerns regarding medicines are outlined above.  Medication changes, Labs and Tests ordered today are listed in the Patient Instructions below. Patient Instructions  Medication Instructions:  Your physician recommends that you continue on your current medications as directed. Please refer to the Current Medication list given to you today.   Labwork: LABS TODAY: BMET  Testing/Procedures: None ordered  Follow-Up: Keep aapointment with Dr. Lovena Le on 03/05/17.  Any Other Special Instructions Will Be Listed Below (If Applicable).     If you need a refill on your cardiac medications before your next appointment, please call your pharmacy.     Jarrett Soho, [place]  02/08/2017 9:10 AM    [address], [place],   [address] Phone: [phone]; Fax: [phone]

## 2017-02-06 ENCOUNTER — Encounter: Payer: Medicare Other | Admitting: Gastroenterology

## 2017-02-08 ENCOUNTER — Ambulatory Visit (INDEPENDENT_AMBULATORY_CARE_PROVIDER_SITE_OTHER): Payer: Medicare Other | Admitting: Physician Assistant

## 2017-02-08 ENCOUNTER — Encounter: Payer: Self-pay | Admitting: Physician Assistant

## 2017-02-08 VITALS — BP 120/82 | HR 64 | Ht 65.0 in

## 2017-02-08 DIAGNOSIS — R600 Localized edema: Secondary | ICD-10-CM

## 2017-02-08 DIAGNOSIS — E784 Other hyperlipidemia: Secondary | ICD-10-CM

## 2017-02-08 DIAGNOSIS — Z79899 Other long term (current) drug therapy: Secondary | ICD-10-CM | POA: Diagnosis not present

## 2017-02-08 DIAGNOSIS — E7849 Other hyperlipidemia: Secondary | ICD-10-CM

## 2017-02-08 DIAGNOSIS — I10 Essential (primary) hypertension: Secondary | ICD-10-CM | POA: Diagnosis not present

## 2017-02-08 DIAGNOSIS — I481 Persistent atrial fibrillation: Secondary | ICD-10-CM

## 2017-02-08 DIAGNOSIS — I4819 Other persistent atrial fibrillation: Secondary | ICD-10-CM

## 2017-02-08 MED ORDER — LOSARTAN POTASSIUM 100 MG PO TABS: 100.0000 mg | ORAL_TABLET | Freq: Every day | ORAL | 3 refills | Status: DC

## 2017-02-08 NOTE — Patient Instructions (Addendum)
Medication Instructions:  Your physician recommends that you continue on your current medications as directed. Please refer to the Current Medication list given to you today.   Labwork: LABS TODAY: BMET  Testing/Procedures: None ordered  Follow-Up: Keep aapointment with Dr. Lovena Le on 03/05/17.  Any Other Special Instructions Will Be Listed Below (If Applicable).     If you need a refill on your cardiac medications before your next appointment, please call your pharmacy.

## 2017-02-09 ENCOUNTER — Ambulatory Visit: Payer: Medicare Other | Admitting: Endocrinology

## 2017-02-09 LAB — BASIC METABOLIC PANEL
BUN / CREAT RATIO: 14 (ref 10–24)
BUN: 29 mg/dL — ABNORMAL HIGH (ref 8–27)
CO2: 25 mmol/L (ref 18–29)
CREATININE: 2.14 mg/dL — AB (ref 0.76–1.27)
Calcium: 9.8 mg/dL (ref 8.6–10.2)
Chloride: 100 mmol/L (ref 96–106)
GFR calc Af Amer: 35 mL/min/{1.73_m2} — ABNORMAL LOW (ref >59)
GFR, EST NON AFRICAN AMERICAN: 30 mL/min/{1.73_m2} — AB (ref >59)
GLUCOSE: 89 mg/dL (ref 65–99)
Potassium: 4.3 mmol/L (ref 3.5–5.2)
SODIUM: 143 mmol/L (ref 134–144)

## 2017-02-13 ENCOUNTER — Encounter: Payer: Self-pay | Admitting: Internal Medicine

## 2017-02-13 ENCOUNTER — Ambulatory Visit: Payer: Medicare Other | Admitting: Internal Medicine

## 2017-02-13 ENCOUNTER — Ambulatory Visit (INDEPENDENT_AMBULATORY_CARE_PROVIDER_SITE_OTHER): Payer: Medicare Other | Admitting: Internal Medicine

## 2017-02-13 ENCOUNTER — Other Ambulatory Visit (INDEPENDENT_AMBULATORY_CARE_PROVIDER_SITE_OTHER): Payer: Medicare Other

## 2017-02-13 VITALS — BP 112/74 | HR 69 | Temp 97.9°F | Resp 16 | Ht 65.0 in | Wt 179.1 lb

## 2017-02-13 DIAGNOSIS — E785 Hyperlipidemia, unspecified: Secondary | ICD-10-CM

## 2017-02-13 DIAGNOSIS — D508 Other iron deficiency anemias: Secondary | ICD-10-CM

## 2017-02-13 DIAGNOSIS — I10 Essential (primary) hypertension: Secondary | ICD-10-CM | POA: Diagnosis not present

## 2017-02-13 DIAGNOSIS — M818 Other osteoporosis without current pathological fracture: Secondary | ICD-10-CM

## 2017-02-13 LAB — LIPID PANEL
CHOLESTEROL: 174 mg/dL (ref 0–200)
HDL: 48 mg/dL (ref >39.00)
LDL CALC: 104 mg/dL — AB (ref 0–99)
NonHDL: 125.57
Total CHOL/HDL Ratio: 4
Triglycerides: 107 mg/dL (ref 0.0–149.0)
VLDL: 21.4 mg/dL (ref 0.0–40.0)

## 2017-02-13 LAB — CBC WITH DIFFERENTIAL/PLATELET
BASOS PCT: 1.2 % (ref 0.0–3.0)
Basophils Absolute: 0.1 10*3/uL (ref 0.0–0.1)
EOS PCT: 2.9 % (ref 0.0–5.0)
Eosinophils Absolute: 0.1 10*3/uL (ref 0.0–0.7)
HCT: 41 % (ref 39.0–52.0)
Hemoglobin: 12.9 g/dL — ABNORMAL LOW (ref 13.0–17.0)
LYMPHS ABS: 1.2 10*3/uL (ref 0.7–4.0)
Lymphocytes Relative: 27.4 % (ref 12.0–46.0)
MCHC: 31.5 g/dL (ref 30.0–36.0)
MCV: 82.4 fl (ref 78.0–100.0)
MONO ABS: 0.5 10*3/uL (ref 0.1–1.0)
Monocytes Relative: 11.2 % (ref 3.0–12.0)
NEUTROS PCT: 57.3 % (ref 43.0–77.0)
Neutro Abs: 2.5 10*3/uL (ref 1.4–7.7)
PLATELETS: 218 10*3/uL (ref 150.0–400.0)
RBC: 4.98 Mil/uL (ref 4.22–5.81)
RDW: 18.6 % — AB (ref 11.5–15.5)
WBC: 4.4 10*3/uL (ref 4.0–10.5)

## 2017-02-13 LAB — BASIC METABOLIC PANEL
BUN: 28 mg/dL — AB (ref 6–23)
CHLORIDE: 101 meq/L (ref 96–112)
CO2: 34 mEq/L — ABNORMAL HIGH (ref 19–32)
CREATININE: 2.18 mg/dL — AB (ref 0.40–1.50)
Calcium: 9.7 mg/dL (ref 8.4–10.5)
GFR: 38.72 mL/min — ABNORMAL LOW (ref >60.00)
GLUCOSE: 91 mg/dL (ref 70–99)
POTASSIUM: 4.2 meq/L (ref 3.5–5.1)
Sodium: 141 mEq/L (ref 135–145)

## 2017-02-13 MED ORDER — FOSTEUM PLUS PO CAPS: 1.0000 | ORAL_CAPSULE | Freq: Two times a day (BID) | ORAL | 11 refills | Status: DC

## 2017-02-13 MED ORDER — CHROMAGEN PO CAPS: 1.0000 | ORAL_CAPSULE | Freq: Every day | ORAL | 11 refills | Status: AC

## 2017-02-13 NOTE — Progress Notes (Signed)
Subjective:  Patient ID: Richard Blake, male    DOB: [birth year]  Age: 71 y.o. MRN: [id number]  CC: Atrial Fibrillation and Anemia   HPI Richard Blake presents for f/up - He complains that he has to take his iron supplement 2-3 times a day and is having trouble with the compliance and wants to try something that's once a day. He tells me that over the last few weeks he has continued to lose weight and the edema in his lower extremities is getting better. He remains in atrial fibrillation but denies chest pain, palpitations, or near-syncope.  Outpatient Medications Prior to Visit  Medication Sig Dispense Refill  . acetaminophen (TYLENOL) 500 MG tablet Take 500-1,000 mg by mouth every 6 (six) hours as needed (for pain from muscle spasms).    . ergocalciferol (VITAMIN D2) 50000 units capsule Take 50,000 Units by mouth once a week. On Mondays and Thursday    . famotidine (PEPCID) 20 MG tablet Take 20 mg by mouth at bedtime as needed for heartburn or indigestion.     Marland Kitchen losartan (COZAAR) 100 MG tablet Take 1 tablet (100 mg total) by mouth daily. 90 tablet 3  . metoprolol tartrate (LOPRESSOR) 25 MG tablet Take 1 tablet (25 mg total) by mouth 2 (two) times daily. 180 tablet 3  . Rivaroxaban (XARELTO) 15 MG TABS tablet Take 1 tablet (15 mg total) by mouth daily with supper. 90 tablet 1  . rosuvastatin (CRESTOR) 10 MG tablet take 1 tablet by mouth once daily 90 tablet 1  . torsemide (DEMADEX) 20 MG tablet Take 1 tablet (20 mg total) by mouth 2 (two) times daily. 60 tablet 3  . vitamin B-12 (CYANOCOBALAMIN) 250 MCG tablet Take 250 mcg by mouth daily.      . Dietary Management Product (FOSTEUM PLUS) CAPS Take 1 capsule by mouth 2 (two) times daily. 60 capsule 11  . ferrous sulfate 325 (65 FE) MG tablet Take 1 tablet by mouth 1-2 times daily as needed for iron supplement    . hydrOXYzine (ATARAX/VISTARIL) 10 MG tablet Take 1 tablet (10 mg total) by mouth 3 (three) times daily as needed. 30 tablet 0     No facility-administered medications prior to visit.     ROS Review of Systems  Constitutional: Negative for activity change, appetite change, diaphoresis, fatigue and unexpected weight change.  HENT: Negative.   Eyes: Negative for visual disturbance.  Respiratory: Negative for cough, chest tightness, shortness of breath and wheezing.   Cardiovascular: Positive for leg swelling. Negative for chest pain and palpitations.  Gastrointestinal: Negative for abdominal pain, constipation, diarrhea, nausea and vomiting.  Endocrine: Negative.   Genitourinary: Negative.  Negative for difficulty urinating.  Musculoskeletal: Negative.  Negative for back pain, myalgias and neck pain.  Skin: Negative.   Allergic/Immunologic: Negative.   Neurological: Negative.  Negative for dizziness, syncope and light-headedness.  Hematological: Negative for adenopathy. Does not bruise/bleed easily.  Psychiatric/Behavioral: Negative.     Objective:  BP 112/74 (BP Location: Left Arm, Patient Position: Sitting, Cuff Size: Normal)   Pulse 69   Temp 97.9 F (36.6 C) (Oral)   Resp 16   Ht 5\' 5"  (1.651 m)   Wt 179 lb 1.3 oz (81.2 kg)   SpO2 98%   BMI 29.80 kg/m   BP Readings from Last 3 Encounters:  02/13/17 112/74  02/08/17 120/82  02/01/17 119/78    Wt Readings from Last 3 Encounters:  02/13/17 179 lb 1.3 oz (81.2 kg)  01/31/17  174 lb 1.6 oz (79 kg)  01/29/17 199 lb 6.4 oz (90.4 kg)    Physical Exam  Constitutional: He is oriented to person, place, and time. No distress.  HENT:  Mouth/Throat: Oropharynx is clear and moist. No oropharyngeal exudate.  Eyes: Conjunctivae are normal. Right eye exhibits no discharge. Left eye exhibits no discharge. No scleral icterus.  Neck: Normal range of motion. Neck supple. No JVD present. No tracheal deviation present. No thyromegaly present.  Cardiovascular: Normal rate, normal heart sounds and intact distal pulses.  An irregularly irregular rhythm present. Exam  reveals no gallop and no friction rub.   No murmur heard. Pulmonary/Chest: Effort normal and breath sounds normal. No stridor. No respiratory distress. He has no wheezes. He has no rales. He exhibits no tenderness.  Abdominal: Soft. Bowel sounds are normal. He exhibits no distension and no mass. There is no tenderness. There is no rebound and no guarding.  Musculoskeletal: Normal range of motion. He exhibits edema (2+ pitting edema in BLE). He exhibits no deformity.  Lymphadenopathy:    He has no cervical adenopathy.  Neurological: He is oriented to person, place, and time.  Skin: Skin is warm and dry. No rash noted. He is not diaphoretic. No erythema. No pallor.  Vitals reviewed.   Lab Results  Component Value Date   WBC 4.4 02/13/2017   HGB 12.9 (L) 02/13/2017   HCT 41.0 02/13/2017   PLT 218.0 02/13/2017   GLUCOSE 91 02/13/2017   CHOL 174 02/13/2017   TRIG 107.0 02/13/2017   HDL 48.00 02/13/2017   LDLDIRECT 186.5 10/23/2012   LDLCALC 104 (H) 02/13/2017   ALT 21 11/23/2016   AST 24 11/23/2016   NA 141 02/13/2017   K 4.2 02/13/2017   CL 101 02/13/2017   CREATININE 2.18 (H) 02/13/2017   BUN 28 (H) 02/13/2017   CO2 34 (H) 02/13/2017   TSH 0.76 11/23/2016   PSA 3.67 02/23/2016   INR 1.1 12/14/2016    No results found.  Assessment & Plan:   Richard Blake was seen today for atrial fibrillation and anemia.  Diagnoses and all orders for this visit:  Hyperlipidemia with target LDL less than 130- he has achieved his LDL goal is doing well on the statin -     Lipid panel; Future  Other iron deficiency anemia- his H&H have improved, will change to a once daily iron supplement -     Iron Combinations (CHROMAGEN) capsule; Take 1 capsule by mouth daily. -     CBC with Differential/Platelet; Future  Other osteoporosis without current pathological fracture- he is due for a repeat DEXA scan -     Dietary Management Product (FOSTEUM PLUS) CAPS; Take 1 capsule by mouth 2 (two) times  daily. -     DG Bone Density; Future  Essential hypertension- his blood pressures adequately well-controlled, electrolytes are normal, renal function is stable. -     Basic metabolic panel; Future   I have discontinued Richard Blake's hydrOXYzine and ferrous sulfate. I am also having him start on chromagen. Additionally, I am having him maintain his vitamin B-12, famotidine, metoprolol tartrate, Rivaroxaban, rosuvastatin, acetaminophen, torsemide, ergocalciferol, losartan, and FOSTEUM PLUS.  Meds ordered this encounter  Medications  . Dietary Management Product (FOSTEUM PLUS) CAPS    Sig: Take 1 capsule by mouth 2 (two) times daily.    Dispense:  60 capsule    Refill:  11  . Iron Combinations (CHROMAGEN) capsule    Sig: Take 1 capsule by mouth daily.  Dispense:  30 capsule    Refill:  11     Follow-up: Return in about 3 months (around 05/15/2017).  Scarlette Calico, MD

## 2017-02-13 NOTE — Patient Instructions (Signed)
Iron Deficiency Anemia, Adult Iron deficiency anemia is a condition in which the concentration of red blood cells or hemoglobin in the blood is below normal because of too little iron. Hemoglobin is a substance in red blood cells that carries oxygen to the body's tissues. When the concentration of red blood cells or hemoglobin is too low, not enough oxygen reaches these tissues. Iron deficiency anemia is usually long-lasting (chronic) and it develops over time. It may or may not cause symptoms. It is a common type of anemia. What are the causes? This condition may be caused by:  Not enough iron in the diet.  Blood loss caused by bleeding in the intestine.  Blood loss from a gastrointestinal condition like Crohn disease.  Frequent blood draws, such as from blood donation.  Abnormal absorption in the gut.  Heavy menstrual periods in women.  Cancers of the gastrointestinal system, such as colon cancer. What are the signs or symptoms? Symptoms of this condition may include:  Fatigue.  Headache.  Pale skin, lips, and nail beds.  Poor appetite.  Weakness.  Shortness of breath.  Dizziness.  Cold hands and feet.  Fast or irregular heartbeat.  Irritability. This is more common in severe anemia.  Rapid breathing. This is more common in severe anemia. Mild anemia may not cause any symptoms. How is this diagnosed? This condition is diagnosed based on:  Your medical history.  A physical exam.  Blood tests. You may have additional tests to find the underlying cause of your anemia, such as:  Testing for blood in the stool (fecal occult blood test).  A procedure to see inside your colon and rectum (colonoscopy).  A procedure to see inside your esophagus and stomach (endoscopy).  A test in which cells are removed from bone marrow (bone marrow aspiration) or fluid is removed from the bone marrow to be examined (biopsy). This is rarely needed. How is this treated? This  condition is treated by correcting the cause of your iron deficiency. Treatment may involve:  Adding iron-rich foods to your diet.  Taking iron supplements. If you are pregnant or breastfeeding, you may need to take extra iron because your normal diet usually does not provide the amount of iron that you need.  Increasing vitamin C intake. Vitamin C helps your body absorb iron. Your health care provider may recommend that you take iron supplements along with a glass of orange juice or a vitamin C supplement.  Medicines to make heavy menstrual flow lighter.  Surgery. You may need repeat blood tests to determine whether treatment is working. Depending on the underlying cause, the anemia should be corrected within 2 months of starting treatment. If the treatment does not seem to be working, you may need more testing. Follow these instructions at home: Medicines  Take over-the-counter and prescription medicines only as told by your health care provider. This includes iron supplements and vitamins.  If you cannot tolerate taking iron supplements by mouth, talk with your health care provider about taking them through a vein (intravenously) or an injection into a muscle.  For the best iron absorption, you should take iron supplements when your stomach is empty. If you cannot tolerate them on an empty stomach, you may need to take them with food.  Do not drink milk or take antacids at the same time as your iron supplements. Milk and antacids may interfere with iron absorption.  Iron supplements can cause constipation. To prevent constipation, include fiber in your diet as told   by your health care provider. A stool softener may also be recommended. Eating and drinking  Talk with your health care provider before changing your diet. He or she may recommend that you eat foods that contain a lot of iron, such as:  Liver.  Low-fat (lean) beef.  Breads and cereals that have iron added to them (are  fortified).  Eggs.  Dried fruit.  Dark green, leafy vegetables.  To help your body use the iron from iron-rich foods, eat those foods at the same time as fresh fruits and vegetables that are high in vitamin C. Foods that are high in vitamin C include:  Oranges.  Peppers.  Tomatoes.  Mangoes.  Drinkenoughfluid to keep your urine clear or pale yellow. General instructions  Return to your normal activities as told by your health care provider. Ask your health care provider what activities are safe for you.  Practice good hygiene. Anemia can make you more prone to illness and infection.  Keep all follow-up visits as told by your health care provider. This is important. Contact a health care provider if:  You feel nauseous or you vomit.  You feel weak.  You have unexplained sweating.  You develop symptoms of constipation, such as:  Having fewer than three bowel movements a week.  Straining to have a bowel movement.  Having stools that are hard, dry, or larger than normal.  Feeling full or bloated.  Pain in the lower abdomen.  Not feeling relief after having a bowel movement. Get help right away if:  You faint. If this happens, do not drive yourself to the hospital. Call your local emergency services (911 in the [place]  You have chest pain.  You have shortness of breath that:  Is severe.  Gets worse with physical activity.  You have a rapid heartbeat.  You become light-headed when getting up from a sitting or lying down position. This information is not intended to replace advice given to you by your health care provider. Make sure you discuss any questions you have with your health care provider. Document Released: 10/27/2000 Document Revised: 07/19/2016 Document Reviewed: 07/19/2016 Elsevier Interactive Patient Education  2017 Elsevier Inc[employer]  

## 2017-02-13 NOTE — Progress Notes (Signed)
Pre visit review using our clinic review tool, if applicable. No additional management support is needed unless otherwise documented below in the visit note. 

## 2017-02-15 ENCOUNTER — Encounter: Payer: Self-pay | Admitting: Internal Medicine

## 2017-02-23 ENCOUNTER — Ambulatory Visit (INDEPENDENT_AMBULATORY_CARE_PROVIDER_SITE_OTHER)
Admission: RE | Admit: 2017-02-23 | Discharge: 2017-02-23 | Disposition: A | Discharge status: Home / Self Care | Payer: Medicare Other | Source: Ambulatory Visit | Attending: Internal Medicine | Admitting: Internal Medicine

## 2017-02-23 DIAGNOSIS — M818 Other osteoporosis without current pathological fracture: Secondary | ICD-10-CM

## 2017-02-27 ENCOUNTER — Encounter: Payer: Self-pay | Admitting: Internal Medicine

## 2017-02-27 LAB — HM DEXA SCAN

## 2017-03-05 ENCOUNTER — Encounter: Payer: Self-pay | Admitting: Internal Medicine

## 2017-03-05 ENCOUNTER — Ambulatory Visit (INDEPENDENT_AMBULATORY_CARE_PROVIDER_SITE_OTHER): Payer: Medicare Other | Admitting: Internal Medicine

## 2017-03-05 VITALS — BP 130/72 | HR 78 | Ht 65.0 in | Wt 176.0 lb

## 2017-03-05 DIAGNOSIS — I481 Persistent atrial fibrillation: Secondary | ICD-10-CM | POA: Diagnosis not present

## 2017-03-05 DIAGNOSIS — I10 Essential (primary) hypertension: Secondary | ICD-10-CM | POA: Diagnosis not present

## 2017-03-05 DIAGNOSIS — I4819 Other persistent atrial fibrillation: Secondary | ICD-10-CM

## 2017-03-05 NOTE — Patient Instructions (Signed)
Medication Instructions:  Your physician recommends that you continue on your current medications as directed. Please refer to the Current Medication list given to you today.   Labwork: None Ordered   Testing/Procedures: None Ordered   Follow-Up: Your physician wants you to follow-up in: 1 year with Dr. Taylor.  You will receive a reminder letter in the mail two months in advance. If you don't receive a letter, please call our office to schedule the follow-up appointment.   If you need a refill on your cardiac medications before your next appointment, please call your pharmacy.   Thank you for choosing [facility]! Michelle Swinyer, RN [phone]    

## 2017-03-05 NOTE — Progress Notes (Signed)
HPI [name] returns today for followup. He is a pleasant 71 yo man with pulmonary sarcoidosis, in remission, with HTN, diastolic heart failure, and persistent atrial fib. He underwent DCCV last week and had ERAF. His heart failure symptoms are much improved on medical therapy. He has tried to reduce his salt intake. Allergies  Allergen Reactions  . Amlodipine Other (See Comments)    Edema   . Welchol [Colesevelam Hcl] Other (See Comments)    Stomach cramps  . Other Swelling    Hair dye - facial swelling   . Prolia [Denosumab] Other (See Comments)    Pain at injection sight     Current Outpatient Prescriptions  Medication Sig Dispense Refill  . acetaminophen (TYLENOL) 500 MG tablet Take 500-1,000 mg by mouth every 6 (six) hours as needed (for pain from muscle spasms).    . Dietary Management Product (FOSTEUM PLUS) CAPS Take 1 capsule by mouth 2 (two) times daily. 60 capsule 11  . ergocalciferol (VITAMIN D2) 50000 units capsule Take 50,000 Units by mouth once a week. On Mondays and Thursday    . famotidine (PEPCID) 20 MG tablet Take 20 mg by mouth at bedtime as needed for heartburn or indigestion.     . Iron Combinations (CHROMAGEN) capsule Take 1 capsule by mouth daily. 30 capsule 11  . losartan (COZAAR) 100 MG tablet Take 1 tablet (100 mg total) by mouth daily. 90 tablet 3  . Rivaroxaban (XARELTO) 15 MG TABS tablet Take 1 tablet (15 mg total) by mouth daily with supper. 90 tablet 1  . rosuvastatin (CRESTOR) 10 MG tablet take 1 tablet by mouth once daily 90 tablet 1  . torsemide (DEMADEX) 20 MG tablet Take 1 tablet (20 mg total) by mouth 2 (two) times daily. 60 tablet 3  . vitamin B-12 (CYANOCOBALAMIN) 250 MCG tablet Take 250 mcg by mouth daily.      . metoprolol tartrate (LOPRESSOR) 25 MG tablet Take 1 tablet (25 mg total) by mouth 2 (two) times daily. 180 tablet 3   No current facility-administered medications for this visit.      Past Medical History:  Diagnosis  Date  . A-fib ([place]) 11/2016   Tx with po meds at this time  . Anemia    Microcytic with Fe Sat 4% 10/31/10  . Atypical chest pain   . Cataract    removed bilaterally  . Chronic rhinitis   . Cough   . Diverticulosis   . Dyspnea    " WITH MY AFIB "  . GERD (gastroesophageal reflux disease)   . Hiatal hernia   . Hiatal hernia   . HLD (hyperlipidemia)   . HTN (hypertension)   . Interstitial lung disease ([place])   . Kidney stones   . Osteoarthritis   . Sarcoidosis 8/99   Transbronchial biopsy, taking daily prednisone since  November 08 on and off since 1999 w/cough every time he relapses off it for more than several weeks  . 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   Past Surgical History:  Procedure Laterality Date  . CARDIOVERSION N/A 12/21/2016   Procedure: CARDIOVERSION;  Surgeon: Evans Lance, MD;  Location: [place];  Service: Cardiovascular;  Laterality: N/A;  . CARDIOVERSION N/A 01/31/2017   Procedure: CARDIOVERSION;  Surgeon: Pixie Casino, MD;  Location: [place];  Service: Cardiovascular;  Laterality:  N/A;  . CATARACT EXTRACTION Left 10/14/2013  . CATARACT EXTRACTION Right 11/24/2013  . CERVICAL FUSION     pt states he did not have a cervical fusion, just a biopsy   . COLONOSCOPY  01/12/2006   tics only   . INGUINAL HERNIA REPAIR     right  . LIPOMA EXCISION Left 09/16/2004   forearm  . spinal cord biopsyOther]  1996   sarcoid     Family History  Problem Relation Age of Onset  . Asthma Mother   . Diabetes Mother   . Arthritis Paternal Grandmother   . Heart disease Paternal Grandmother   . Hypertension Father     Fathers family most  . Prostate cancer Father   . Kidney disease Father   . Colon polyps Sister   . Multiple myeloma Sister   . Heart disease Paternal Uncle   . Kidney disease Sister   . Sarcoidosis Neg Hx   . Other Neg Hx      osteoporosis  . Colon cancer Neg Hx   . Esophageal cancer Neg Hx   . Rectal cancer Neg Hx   . Stomach cancer Neg Hx      Social History   Social History  . Marital status: Single    Spouse name: N/A  . Number of children: 0  . Years of education: N/A   Occupational History  . Retired [employer]  . Smoking status: Never Smoker  . Smokeless tobacco: Never Used  . Alcohol use No  . Drug use: No  . Sexual activity: Not Currently   Other Topics Concern  . Not on file   Social History Narrative   Regular Exercise -  NO     BP 130/72   Pulse 78   Ht '5\' 5"'  (1.651 m)   Wt 176 lb (79.8 kg)   SpO2 98%   BMI 29.29 kg/m   Physical Exam:  Well appearing 71 yo man, NAD HEENT: Unremarkable Neck:  6 cm JVD, no thyromegally Lymphatics:  No adenopathy Back:  No CVA tenderness Lungs:  Clear with no wheezes HEART:  Regular rate rhythm, no murmurs, no rubs, no clicks Abd:  soft, positive bowel sounds, no organomegally, no rebound, no guarding Ext:  2 plus pulses, no edema, no cyanosis, no clubbing Skin:  No rashes no nodules Neuro:  CN II through XII intact, motor grossly intact  EKG - atrial fib with a controlled VR  Assess/Plan: 1. Persistent atrial fib - we discussed the treatment options in detail. He has failed dofetilide and I have recommended her continue a strategy of rate control. Will follow 2. coags - he appears to be tolerating Xarelto. 3. Peripheral edema - I suspect this is multifactorial. He will continue the torsemide for now 4. Dyslipidemia - he will continue his statin therapy.  Mikle Bosworth.D.

## 2017-04-13 NOTE — Addendum Note (Signed)
Addendum  created 04/13/17 1054 by Rica Koyanagi, MD   Sign clinical note

## 2017-04-13 NOTE — Addendum Note (Signed)
Addendum  created 04/13/17 1053 by Rica Koyanagi, MD   Sign clinical note

## 2017-05-24 NOTE — Addendum Note (Signed)
Addendum  created 05/24/17 1415 by Montez Hageman, MD   Sign clinical note

## 2017-05-24 NOTE — Anesthesia Postprocedure Evaluation (Signed)
Anesthesia Post Note  Patient: Richard Blake  Procedure(s) Performed: Procedure(s) (LRB): CARDIOVERSION (N/A)     Anesthesia Post Evaluation  Last Vitals:  Vitals:   01/31/17 2139 02/01/17 0516  BP: 125/78 119/78  Pulse: 81 94  Resp: 18 20  Temp: 36.9 C 36.4 C    Last Pain:  Vitals:   02/01/17 0516  TempSrc: Oral  PainSc:                  Montez Hageman

## 2017-06-05 DIAGNOSIS — H1859 Other hereditary corneal dystrophies: Secondary | ICD-10-CM | POA: Diagnosis not present

## 2017-06-19 ENCOUNTER — Other Ambulatory Visit: Payer: Self-pay | Admitting: Internal Medicine

## 2017-06-19 DIAGNOSIS — I4819 Other persistent atrial fibrillation: Secondary | ICD-10-CM

## 2017-08-28 DIAGNOSIS — Z23 Encounter for immunization: Secondary | ICD-10-CM | POA: Diagnosis not present

## 2017-09-13 ENCOUNTER — Ambulatory Visit (INDEPENDENT_AMBULATORY_CARE_PROVIDER_SITE_OTHER): Payer: Medicare Other | Admitting: *Deleted

## 2017-09-13 VITALS — BP 136/74 | HR 74 | Resp 20 | Ht 65.0 in | Wt 172.0 lb

## 2017-09-13 DIAGNOSIS — Z0000 Encounter for general adult medical examination without abnormal findings: Secondary | ICD-10-CM

## 2017-09-13 NOTE — Progress Notes (Signed)
Pre visit review using our clinic review tool, if applicable. No additional management support is needed unless otherwise documented below in the visit note. 

## 2017-09-13 NOTE — Patient Instructions (Addendum)
[name] , Thank you for taking time to come for your Medicare Wellness Visit. I appreciate your ongoing commitment to your health goals. Please review the following plan we discussed and let me know if I can assist you in the future.   These are the goals we discussed: Goals    . Stay as healthy and as independent as possible          Think about down sizing and moving to independent living. Continue to enjoy reading, travel, and enjoy life.       This is a list of the screening recommended for you and due dates:  Health Maintenance  Topic Date Due  . Tetanus Vaccine  10/31/2020  . Colon Cancer Screening  07/13/2026  . Flu Shot  Completed  .  Hepatitis C: One time screening is recommended by Center for Disease Control  (CDC) for  adults born from [birth year] through 1964.   Completed  . Pneumonia vaccines  Completed         Health Maintenance, Male A healthy lifestyle and preventive care is important for your health and wellness. Ask your health care provider about what schedule of regular examinations is right for you. What should I know about weight and diet? Eat a Healthy Diet  Eat plenty of vegetables, fruits, whole grains, low-fat dairy products, and lean protein.  Do not eat a lot of foods high in solid fats, added sugars, or salt.  Maintain a Healthy Weight Regular exercise can help you achieve or maintain a healthy weight. You should:  Do at least 150 minutes of exercise each week. The exercise should increase your heart rate and make you sweat (moderate-intensity exercise).  Do strength-training exercises at least twice a week.  Watch Your Levels of Cholesterol and Blood Lipids  Have your blood tested for lipids and cholesterol every 5 years starting at 70 years of age. If you are at high risk for heart disease, you should start having your blood tested when you are 70 years old. You may need to have your cholesterol levels checked more often if: ? Your lipid or  cholesterol levels are high. ? You are older than 70 years of age. ? You are at high risk for heart disease.  What should I know about cancer screening? Many types of cancers can be detected early and may often be prevented. Lung Cancer  You should be screened every year for lung cancer if: ? You are a current smoker who has smoked for at least 30 years. ? You are a former smoker who has quit within the past 15 years.  Talk to your health care provider about your screening options, when you should start screening, and how often you should be screened.  Colorectal Cancer  Routine colorectal cancer screening usually begins at 70 years of age and should be repeated every 5-10 years until you are 70 years old. You may need to be screened more often if early forms of precancerous polyps or small growths are found. Your health care provider may recommend screening at an earlier age if you have risk factors for colon cancer.  Your health care provider may recommend using home test kits to check for hidden blood in the stool.  A small camera at the end of a tube can be used to examine your colon (sigmoidoscopy or colonoscopy). This checks for the earliest forms of colorectal cancer.  Prostate and Testicular Cancer  Depending on your age and overall  health, your health care provider may do certain tests to screen for prostate and testicular cancer.  Talk to your health care provider about any symptoms or concerns you have about testicular or prostate cancer.  Skin Cancer  Check your skin from head to toe regularly.  Tell your health care provider about any new moles or changes in moles, especially if: ? There is a change in a mole's size, shape, or color. ? You have a mole that is larger than a pencil eraser.  Always use sunscreen. Apply sunscreen liberally and repeat throughout the day.  Protect yourself by wearing long sleeves, pants, a wide-brimmed hat, and sunglasses when  outside.  What should I know about heart disease, diabetes, and high blood pressure?  If you are 76-74 years of age, have your blood pressure checked every 3-5 years. If you are 38 years of age or older, have your blood pressure checked every year. You should have your blood pressure measured twice-once when you are at a hospital or clinic, and once when you are not at a hospital or clinic. Record the average of the two measurements. To check your blood pressure when you are not at a hospital or clinic, you can use: ? An automated blood pressure machine at a pharmacy. ? A home blood pressure monitor.  Talk to your health care provider about your target blood pressure.  If you are between 51-3 years old, ask your health care provider if you should take aspirin to prevent heart disease.  Have regular diabetes screenings by checking your fasting blood sugar level. ? If you are at a normal weight and have a low risk for diabetes, have this test once every three years after the age of 27. ? If you are overweight and have a high risk for diabetes, consider being tested at a younger age or more often.  A one-time screening for abdominal aortic aneurysm (AAA) by ultrasound is recommended for men aged 41-75 years who are current or former smokers. What should I know about preventing infection? Hepatitis B If you have a higher risk for hepatitis B, you should be screened for this virus. Talk with your health care provider to find out if you are at risk for hepatitis B infection. Hepatitis C Blood testing is recommended for:  Everyone born from [birth year] through 1964.  Anyone with known risk factors for hepatitis C.  Sexually Transmitted Diseases (STDs)  You should be screened each year for STDs including gonorrhea and chlamydia if: ? You are sexually active and are younger than 70 years of age. ? You are older than 70 years of age and your health care provider tells you that you are at risk for this  type of infection. ? Your sexual activity has changed since you were last screened and you are at an increased risk for chlamydia or gonorrhea. Ask your health care provider if you are at risk.  Talk with your health care provider about whether you are at high risk of being infected with HIV. Your health care provider may recommend a prescription medicine to help prevent HIV infection.  What else can I do?  Schedule regular health, dental, and eye exams.  Stay current with your vaccines (immunizations).  Do not use any tobacco products, such as cigarettes, chewing tobacco, and e-cigarettes. If you need help quitting, ask your health care provider.  Limit alcohol intake to no more than 2 drinks per day. One drink equals 12 ounces of beer, 5  ounces of Colton Engdahl, or 1 ounces of hard liquor.  Do not use street drugs.  Do not share needles.  Ask your health care provider for help if you need support or information about quitting drugs.  Tell your health care provider if you often feel depressed.  Tell your health care provider if you have ever been abused or do not feel safe at home. This information is not intended to replace advice given to you by your health care provider. Make sure you discuss any questions you have with your health care provider. Document Released: 04/27/2008 Document Revised: 06/28/2016 Document Reviewed: 08/03/2015 Elsevier Interactive Patient Education  [employer].

## 2017-09-13 NOTE — Progress Notes (Addendum)
Subjective:   Richard Blake is a 71 y.o. male who presents for Medicare Annual/Subsequent preventive examination.  Review of Systems:  No ROS.  Medicare Wellness Visit. Additional risk factors are reflected in the social history.  Cardiac Risk Factors include: advanced age (>32mn, >>23women);dyslipidemia;hypertension;male gender Sleep patterns: feels rested on waking, gets up 1-2 times nightly to void and sleeps 5-6 hours nightly.    Home Safety/Smoke Alarms: Feels safe in home. Smoke alarms in place.  Living environment; residence and Firearm Safety: 1-story house/ trailer, no firearms Lives alone, no needs for DME, good support system. Seat Belt Safety/Bike Helmet: Wears seat belt.   Objective:    Vitals: BP 136/74   Pulse 74   Resp 20   Ht _0  (1.651 m)   Wt 172 lb (78 kg)   SpO2 98%   BMI 28.62 kg/m   Body mass index is 28.62 kg/m.  Tobacco History  Smoking Status  . Never Smoker  Smokeless Tobacco  . Never Used     Counseling given: Not Answered   Past Medical History:  Diagnosis Date  . [place]-fib [place]   Tx with po meds at this time  . Anemia    Microcytic with Fe Sat 4% 10/31/10  . Atypical chest pain   . Cataract    removed bilaterally  . Chronic rhinitis   . Cough   . Diverticulosis   . Dyspnea    " WITH MY AFIB "  . GERD (gastroesophageal reflux disease)   . Hiatal hernia   . Hiatal hernia   . HLD (hyperlipidemia)   . HTN (hypertension)   . Interstitial lung disease ([place]   . Kidney stones   . Osteoarthritis   . Sarcoidosis 8/99   Transbronchial biopsy, taking daily prednisone since  November 08 on and off since 1999 w/cough every time he relapses off it for more than several weeks  . 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 CARDIOVERSION N/A 12/21/2016   Procedure: CARDIOVERSION;  Surgeon: GEvans Lance MD;  [place]   Service: Cardiovascular;  Laterality: N/A;  . CARDIOVERSION N/A 01/31/2017   Procedure: CARDIOVERSION;  Surgeon: KPixie Casino MD;  [facility];  Service: Cardiovascular;  Laterality: N/A;  . CATARACT EXTRACTION Left 10/14/2013  . CATARACT EXTRACTION Right 11/24/2013  . CERVICAL FUSION     pt states he did not have a cervical fusion, just a biopsy   . COLONOSCOPY  01/12/2006   tics only   . INGUINAL HERNIA REPAIR     right  . LIPOMA EXCISION Left 09/16/2004   forearm  . spinal cord biopsyOther]  1996   sarcoid   Family History  Problem Relation Age of Onset  . Asthma Mother   . Diabetes Mother   . Arthritis Paternal Grandmother   . Heart disease Paternal Grandmother   . Hypertension Father        Fathers family most  . Prostate cancer Father   . Kidney disease Father   . Colon polyps Sister   . Multiple myeloma Sister   . Heart disease Paternal Uncle   . Kidney disease Sister   . Sarcoidosis Neg Hx   . Other Neg Hx        osteoporosis  . Colon cancer Neg Hx   . Esophageal cancer Neg Hx   . Rectal cancer Neg Hx   .  Stomach cancer Neg Hx    History  Sexual Activity  . Sexual activity: Not Currently    Outpatient Encounter Prescriptions as of 09/13/2017  Medication Sig  . acetaminophen (TYLENOL) 500 MG tablet Take 500-1,000 mg by mouth every 6 (six) hours as needed (for pain from muscle spasms).  . Dietary Management Product (FOSTEUM PLUS) CAPS Take 1 capsule by mouth 2 (two) times daily.  . ergocalciferol (VITAMIN D2) 50000 units capsule Take 50,000 Units by mouth once a week. On Mondays and Thursday  . famotidine (PEPCID) 20 MG tablet Take 20 mg by mouth at bedtime as needed for heartburn or indigestion.   . Iron Combinations (CHROMAGEN) capsule Take 1 capsule by mouth daily.  Marland Kitchen losartan (COZAAR) 100 MG tablet Take 1 tablet (100 mg total) by mouth daily.  . rosuvastatin (CRESTOR) 10 MG tablet take 1 tablet by mouth once daily  . torsemide (DEMADEX) 20 MG  tablet Take 1 tablet (20 mg total) by mouth 2 (two) times daily.  . vitamin B-12 (CYANOCOBALAMIN) 250 MCG tablet Take 250 mcg by mouth daily.    Alveda Reasons 15 MG TABS tablet take 1 tablet by mouth once daily WITH SUPPER  . metoprolol tartrate (LOPRESSOR) 25 MG tablet Take 1 tablet (25 mg total) by mouth 2 (two) times daily.   No facility-administered encounter medications on file as of 09/13/2017.     Activities of Daily Living In your present state of health, do you have any difficulty performing the following activities: 09/13/2017 01/29/2017  Hearing? N -  Vision? N -  Difficulty concentrating or making decisions? N -  Walking or climbing stairs? N -  Dressing or bathing? N -  Doing errands, shopping? N N  Preparing Food and eating ? N -  Using the Toilet? N -  In the past six months, have you accidently leaked urine? N -  Do you have problems with loss of bowel control? N -  Managing your Medications? N -  Managing your Finances? N -  Housekeeping or managing your Housekeeping? N -  Some recent data might be hidden    Patient Care Team: Janith Lima, MD as PCP - General Love, Alyson Locket, MD (Neurology) Tanda Rockers, MD as Attending Physician (Pulmonary Disease) Evans Lance, MD as Consulting Physician (Cardiology)   Assessment:    Physical assessment deferred to PCP.  Exercise Activities and Dietary recommendations Current Exercise Habits: Home exercise routine, Type of exercise: calisthenics, Time (Minutes): 60, Frequency (Times/Week): 4, Weekly Exercise (Minutes/Week): 240, Intensity: Mild, Exercise limited by: orthopedic condition(s)  Diet (meal preparation, eat out, water intake, caffeinated beverages, dairy products, fruits and vegetables): in general, a "healthy" diet  , well balanced, eats a variety of fruits and vegetables daily, limits salt, fat/cholesterol, sugar, caffeine, drinks 6-8 glasses of water daily.      Goals    . Stay as healthy and as independent  as possible          Think about down sizing and moving to independent living. Continue to enjoy reading, travel, and enjoy life.      Fall Risk Fall Risk  04/20/2016 01/12/2015 08/29/2013 08/28/2013  Falls in the past year? No No No No   Depression Screen PHQ 2/9 Scores 09/13/2017 04/20/2016 01/12/2015 08/29/2013  PHQ - 2 Score 2 0 0 0  PHQ- 9 Score 3 - - -    Cognitive Function MMSE - Mini Mental State Exam 09/13/2017  Orientation to time 5  Orientation  to Place 5  Registration 3  Attention/ Calculation 5  Recall 2  Language- name 2 objects 2  Language- repeat 1  Language- follow 3 step command 3  Language- read & follow direction 1  Write a sentence 1  Copy design 1  Total score 29        Immunization History  Administered Date(s) Administered  . Influenza Whole 10/31/2010, 09/14/2011, 10/16/2012  . Influenza, High Dose Seasonal PF 07/20/2013, 08/28/2017  . Influenza,inj,Quad PF,6+ Mos 01/12/2015, 11/02/2015  . Influenza-Unspecified 08/22/2016  . Pneumococcal Conjugate-13 01/12/2015  . Pneumococcal Polysaccharide-23 12/14/2000, 01/19/2016  . Td 10/31/2010  . Zoster 04/20/2016   Screening Tests Health Maintenance  Topic Date Due  . TETANUS/TDAP  10/31/2020  . COLONOSCOPY  07/13/2026  . INFLUENZA VACCINE  Completed  . Hepatitis C Screening  Completed  . PNA vac Low Risk Adult  Completed      Plan:    Continue doing brain stimulating activities (puzzles, reading, adult coloring books, staying active) to keep memory sharp.   Continue to eat heart healthy diet (full of fruits, vegetables, whole grains, lean protein, water--limit salt, fat, and sugar intake) and increase physical activity as tolerated.  I have personally reviewed and noted the following in the patient's chart:   . Medical and social history . Use of alcohol, tobacco or illicit drugs  . Current medications and supplements . Functional ability and status . Nutritional status . Physical  activity . Advanced directives . List of other physicians . Vitals . Screenings to include cognitive, depression, and falls . Referrals and appointments  In addition, I have reviewed and discussed with patient certain preventive protocols, quality metrics, and best practice recommendations. A written personalized care plan for preventive services as well as general preventive health recommendations were provided to patient.   Medical screening examination/treatment/procedure(s) were performed by non-physician practitioner and as supervising physician I was immediately available for consultation/collaboration. I agree with above. Scarlette Calico, MD   Michiel Cowboy, RN  09/13/2017

## 2017-09-25 ENCOUNTER — Other Ambulatory Visit (INDEPENDENT_AMBULATORY_CARE_PROVIDER_SITE_OTHER): Payer: Medicare Other

## 2017-09-25 ENCOUNTER — Encounter: Payer: Self-pay | Admitting: Internal Medicine

## 2017-09-25 ENCOUNTER — Ambulatory Visit (INDEPENDENT_AMBULATORY_CARE_PROVIDER_SITE_OTHER): Payer: Medicare Other | Admitting: Internal Medicine

## 2017-09-25 VITALS — BP 120/74 | HR 75 | Temp 98.9°F | Resp 16 | Ht 65.0 in | Wt 172.0 lb

## 2017-09-25 DIAGNOSIS — N138 Other obstructive and reflux uropathy: Secondary | ICD-10-CM

## 2017-09-25 DIAGNOSIS — R972 Elevated prostate specific antigen [PSA]: Secondary | ICD-10-CM

## 2017-09-25 DIAGNOSIS — N401 Enlarged prostate with lower urinary tract symptoms: Secondary | ICD-10-CM | POA: Diagnosis not present

## 2017-09-25 DIAGNOSIS — I10 Essential (primary) hypertension: Secondary | ICD-10-CM

## 2017-09-25 DIAGNOSIS — N200 Calculus of kidney: Secondary | ICD-10-CM | POA: Diagnosis not present

## 2017-09-25 LAB — BASIC METABOLIC PANEL
BUN: 26 mg/dL — ABNORMAL HIGH (ref 6–23)
CO2: 31 mEq/L (ref 19–32)
Calcium: 9.6 mg/dL (ref 8.4–10.5)
Chloride: 100 mEq/L (ref 96–112)
Creatinine, Ser: 1.96 mg/dL — ABNORMAL HIGH (ref 0.40–1.50)
GFR: 43.69 mL/min — ABNORMAL LOW (ref >60.00)
Glucose, Bld: 88 mg/dL (ref 70–99)
Potassium: 3.8 mEq/L (ref 3.5–5.1)
Sodium: 140 mEq/L (ref 135–145)

## 2017-09-25 LAB — CBC WITH DIFFERENTIAL/PLATELET
BASOS ABS: 0.1 10*3/uL (ref 0.0–0.1)
Basophils Relative: 1.4 % (ref 0.0–3.0)
EOS ABS: 0.2 10*3/uL (ref 0.0–0.7)
Eosinophils Relative: 3.9 % (ref 0.0–5.0)
HEMATOCRIT: 32.8 % — AB (ref 39.0–52.0)
LYMPHS PCT: 23.5 % (ref 12.0–46.0)
Lymphs Abs: 1 10*3/uL (ref 0.7–4.0)
MCHC: 30.5 g/dL (ref 30.0–36.0)
MCV: 78.7 fl (ref 78.0–100.0)
MONO ABS: 0.5 10*3/uL (ref 0.1–1.0)
Monocytes Relative: 11.1 % (ref 3.0–12.0)
Neutro Abs: 2.7 10*3/uL (ref 1.4–7.7)
Neutrophils Relative %: 60.1 % (ref 43.0–77.0)
Platelets: 266 10*3/uL (ref 150.0–400.0)
RBC: 4.17 Mil/uL — AB (ref 4.22–5.81)
RDW: 15.5 % (ref 11.5–15.5)
WBC: 4.5 10*3/uL (ref 4.0–10.5)

## 2017-09-25 LAB — PSA: PSA: 4.21 ng/mL — ABNORMAL HIGH (ref 0.10–4.00)

## 2017-09-25 LAB — URINALYSIS, ROUTINE W REFLEX MICROSCOPIC
Bilirubin Urine: NEGATIVE
Hgb urine dipstick: NEGATIVE
KETONES UR: NEGATIVE
Leukocytes, UA: NEGATIVE
NITRITE: NEGATIVE
PH: 7 (ref 5.0–8.0)
RBC / HPF: NONE SEEN (ref 0–?)
SPECIFIC GRAVITY, URINE: 1.01 (ref 1.000–1.030)
TOTAL PROTEIN, URINE-UPE24: NEGATIVE
URINE GLUCOSE: NEGATIVE
Urobilinogen, UA: 0.2 (ref 0.0–1.0)

## 2017-09-25 MED ORDER — FINASTERIDE 5 MG PO TABS: 5.0000 mg | ORAL_TABLET | Freq: Every day | ORAL | 1 refills | Status: DC

## 2017-09-25 NOTE — Patient Instructions (Signed)
Benign Prostatic Hyperplasia °Benign prostatic hyperplasia is when the prostate gland is bigger than normal (enlarged). The prostate is a gland that produces the fluid that goes into semen. It is near the opening to the bladder and it surrounds the tube that drains urine out of the body (urethra). Benign prostatic hyperplasia is common among older men and it typically causes problems with urinating. °The prostate grows slowly as you age. As the prostate grows, it can pinch the urethra. This causes the bladder to work too hard to pass urine, which leads to a thickened bladder wall. The bladder may eventually become weak and unable to empty completely. °What are the causes? °The exact cause of this condition is not known. It may be related to changes in hormones as the body ages. °What increases the risk? °You are more likely to develop this condition if: °· You have a family history of the condition. °· You are age 40 or older. °· You have a history of erectile dysfunction. °· You do not exercise. °· You have certain medical conditions, including: °¨ Type 2 diabetes. °¨ Obesity. °¨ Heart and circulatory disease. °What are the signs or symptoms? °Symptoms of this condition include: °· Weak or interrupted urine stream. °· Dribbling or leaking urine. °· Feeling like the bladder has not emptied completely. °· Difficulty starting urination. °· Getting up frequently at night to urinate. °· Urinating more often (8 or more times a day). °· Accidental loss of urine (urinary incontinence). °· Pain during urination or ejaculation. °· Urine with an unusual smell or color. °The size of the prostate does not always determine the severity of the symptoms. For example, a man with a large prostate may experience minor symptoms, or a man with a smaller prostate may experience a severe blockage. °How is this diagnosed? °This condition may be diagnosed based on: °· Your medical history and symptoms. °· A physical exam. This usually  includes a digital rectal exam. During this exam, your health care provider places a gloved, lubricated finger into the rectum to feel the size of the prostate. °· A blood test. This test checks for high levels of a protein that is produced by the prostate (prostate specific antigen, PSA). °· Tests to examine how well the urethra and bladder are functioning (urodynamic tests). °· Cystoscopy. For this test, a small, tube-shaped instrument (cystoscope) is used to look inside the urethra and bladder. The cystoscope is placed into the urinary tract through the opening at the tip of the penis. °· Urine tests. °· Ultrasound. °How is this treated? °Treatment for this condition depends on how severe your symptoms are. Treatment may include: °· Active surveillance or "watchful waiting." If your symptoms are mild, your health care provider may delay treatment and ask you to keep track of your symptoms. You will have regular checkups to examine the size of your prostate, discuss symptoms, and determine whether treatment is needed. °· Medicines. These may be used to: °¨ Stop prostate growth. °¨ Shrink the prostate. °¨ Relieve symptoms. °· Lifestyle changes, including: °¨ Pelvic floor muscle exercises. The pelvic floor muscles are a group of muscles that relax when you urinate. °¨ Bladder training. This involves exercises that train the bladder to hold more urine for longer periods. °¨ Reducing the amount of liquid that you drink. This is especially important before sleeping and before long periods of time spent in public. °¨ Reducing the amount of caffeine and alcohol that you drink. °¨ Treating or preventing constipation. °·   Surgery to reduce the size of the prostate or widen the urethra. This is typically done if your symptoms are severe or there are serious complications from the enlarged prostate. °Follow these instructions at home: °Medicines  °· Take over-the-counter and prescription medicines as told by your health care  provider. °· Avoid certain medicines, such as decongestants, antihistamines, and some prescription medicines as told by your health care provider. Ask your health care provider which medicines you should avoid. °General instructions  °· Monitor your symptoms for any changes. Tell your health care provider about any changes. °· Give yourself time when you urinate. °· Avoid certain beverages that can irritate the bladder, such as: °¨ Alcohol. °¨ Caffeinated drinks like coffee, tea, and cola. °· Avoid drinking large amounts of liquid before bed or before going out in public. °· Do pelvic floor muscle or bladder training exercises as told by your health care provider. °· Keep all follow-up visits as told by your health care provider. This is important. °Contact a health care provider if: °· Your develop new or worse symptoms. °· You have trouble getting or maintaining an erection. °· You have a fever. °· You have pain or burning during urination. °· You have blood in your urine. °Get help right away if: °· You have severe pain when urinating. °· You cannot urinate. °· You have severe pain in your abdomen. °· You are dizzy. °· You faint. °· You have severe back pain. °· Your urine is dark red and difficult to see through. °· You have large blood clots in your urine. °· You have severe pain after an erection. °· You have chest pain, dizziness, or nausea during sexual activity. °Summary °· The prostate is a gland that produces the fluid that goes into semen. It is near the opening to the bladder and it surrounds the tube that drains urine out of the body (urethra). °· Benign prostatic hyperplasia is common among older men and it typically causes problems with urinating. °· If your symptoms are mild, your health care provider may delay treatment and ask you to keep track of your symptoms. You will have regular checkups to examine the size of your prostate, discuss symptoms, and determine whether treatment is needed. °· If  directed, you may need to avoid certain medicines, such as decongestants, antihistamines, and some prescription medicines. °· Contact your health care provider if you develop new or worse symptoms. °This information is not intended to replace advice given to you by your health care provider. Make sure you discuss any questions you have with your health care provider. °Document Released: 10/30/2005 Document Revised: 09/18/2016 Document Reviewed: 09/18/2016 °Elsevier Interactive Patient Education © 2017 [employer] ° °

## 2017-09-25 NOTE — Progress Notes (Signed)
Subjective:  Patient ID: Richard Blake, male    DOB: Jan 07, 1946  Age: 71 y.o. MRN: [id number]  CC: Atrial Fibrillation and Benign Prostatic Hypertrophy   HPI Richard Blake presents for f/up - he complains for the last few months that his urine stream has been split and he has had some dribbling.  He has nocturia about 1-2 times per night.  Outpatient Medications Prior to Visit  Medication Sig Dispense Refill  . acetaminophen (TYLENOL) 500 MG tablet Take 500-1,000 mg by mouth every 6 (six) hours as needed (for pain from muscle spasms).    . Dietary Management Product (FOSTEUM PLUS) CAPS Take 1 capsule by mouth 2 (two) times daily. 60 capsule 11  . ergocalciferol (VITAMIN D2) 50000 units capsule Take 50,000 Units by mouth once a week. On Mondays and Thursday    . famotidine (PEPCID) 20 MG tablet Take 20 mg by mouth at bedtime as needed for heartburn or indigestion.     . Iron Combinations (CHROMAGEN) capsule Take 1 capsule by mouth daily. 30 capsule 11  . losartan (COZAAR) 100 MG tablet Take 1 tablet (100 mg total) by mouth daily. 90 tablet 3  . rosuvastatin (CRESTOR) 10 MG tablet take 1 tablet by mouth once daily 90 tablet 1  . torsemide (DEMADEX) 20 MG tablet Take 1 tablet (20 mg total) by mouth 2 (two) times daily. 60 tablet 3  . vitamin B-12 (CYANOCOBALAMIN) 250 MCG tablet Take 250 mcg by mouth daily.      Alveda Reasons 15 MG TABS tablet take 1 tablet by mouth once daily WITH SUPPER 90 tablet 0  . metoprolol tartrate (LOPRESSOR) 25 MG tablet Take 1 tablet (25 mg total) by mouth 2 (two) times daily. 180 tablet 3   No facility-administered medications prior to visit.     ROS Review of Systems  Constitutional: Negative.  Negative for chills and fatigue.  HENT: Negative.   Eyes: Negative.   Respiratory: Negative for cough, chest tightness and shortness of breath.   Cardiovascular: Negative.  Negative for chest pain, palpitations and leg swelling.  Gastrointestinal: Negative for  abdominal pain, constipation, diarrhea, nausea and vomiting.  Endocrine: Negative.   Genitourinary: Positive for difficulty urinating. Negative for decreased urine volume, discharge, dysuria, flank pain, frequency, hematuria, penile swelling, scrotal swelling, testicular pain and urgency.  Musculoskeletal: Negative.  Negative for back pain, myalgias and neck pain.  Skin: Negative.  Negative for color change and rash.  Neurological: Negative.  Negative for dizziness.  Hematological: Negative for adenopathy. Does not bruise/bleed easily.  Psychiatric/Behavioral: Negative.     Objective:  BP 120/74 (BP Location: Left Arm, Patient Position: Sitting, Cuff Size: Normal)   Pulse 75   Temp 98.9 F (37.2 C) (Oral)   Resp 16   Ht 5\' 5"  (1.651 m)   Wt 172 lb (78 kg)   SpO2 97%   BMI 28.62 kg/m   BP Readings from Last 3 Encounters:  09/25/17 120/74  09/13/17 136/74  03/05/17 130/72    Wt Readings from Last 3 Encounters:  09/25/17 172 lb (78 kg)  09/13/17 172 lb (78 kg)  03/05/17 176 lb (79.8 kg)    Physical Exam  Constitutional: He is oriented to person, place, and time. No distress.  HENT:  Mouth/Throat: Oropharynx is clear and moist. No oropharyngeal exudate.  Eyes: Conjunctivae are normal. Right eye exhibits no discharge. No scleral icterus.  Neck: Normal range of motion. Neck supple. No JVD present. No thyromegaly present.  Cardiovascular: Normal  rate. An irregularly irregular rhythm present. Exam reveals no gallop.  No murmur heard. Pulmonary/Chest: Effort normal and breath sounds normal. He has no wheezes.  Abdominal: Soft. Bowel sounds are normal. He exhibits no distension and no mass. There is no tenderness. There is no rebound and no guarding. Hernia confirmed negative in the right inguinal area and confirmed negative in the left inguinal area.  Genitourinary: Rectum normal, testes normal and penis normal. Rectal exam shows no external hemorrhoid, no internal hemorrhoid, no  fissure, no mass, no tenderness, anal tone normal and guaiac negative stool. Prostate is enlarged (2+ BPH rt lobe > left lobe). Prostate is not tender. Right testis shows no mass, no swelling and no tenderness. Right testis is descended. Left testis shows no mass, no swelling and no tenderness. Left testis is descended. Circumcised. No penile erythema or penile tenderness. No discharge found.  Musculoskeletal: Normal range of motion. He exhibits edema (1+ pitting edema in BLE). He exhibits no tenderness or deformity.  Lymphadenopathy:    He has no cervical adenopathy.       Right: No inguinal adenopathy present.       Left: No inguinal adenopathy present.  Neurological: He is alert and oriented to person, place, and time.  Skin: Skin is warm and dry. No rash noted. He is not diaphoretic. No erythema. No pallor.  Vitals reviewed.   Lab Results  Component Value Date   WBC 4.5 09/25/2017   HGB 10.0 Repeated and verified X2. (L) 09/25/2017   HCT 32.8 (L) 09/25/2017   PLT 266.0 09/25/2017   GLUCOSE 88 09/25/2017   CHOL 174 02/13/2017   TRIG 107.0 02/13/2017   HDL 48.00 02/13/2017   LDLDIRECT 186.5 10/23/2012   LDLCALC 104 (H) 02/13/2017   ALT 21 11/23/2016   AST 24 11/23/2016   NA 140 09/25/2017   K 3.8 09/25/2017   CL 100 09/25/2017   CREATININE 1.96 (H) 09/25/2017   BUN 26 (H) 09/25/2017   CO2 31 09/25/2017   TSH 0.76 11/23/2016   PSA 4.21 (H) 09/25/2017   INR 1.1 12/14/2016    Dg Bone Density  Result Date: 02/26/2017 Date of study: 02/23/2017 Exam: DUAL X-RAY ABSORPTIOMETRY (DXA) FOR BONE MINERAL DENSITY (BMD) Instrument: [employer] Requesting Provider: PCP Indication: follow up for osteoporosis Comparison: 01/20/2015 Clinical data: Pt is a 71 y.o. male with previous history of wrist fracture. On calcium and vitamin D. Results:  Lumbar spine L1-L4 (L3) Femoral neck (FN) T-score -1.0 RFN: -2.4 LFN: -2.1 Change in BMD from previous DXA test (%) -1.3% -1.5% (*) statistically  significant Assessment: the BMD is low according to the [facility] classification for osteoporosis (see below). Fracture risk: moderate FRAX score: 10 year major osteoporotic risk: 5.9%. 10 year hip fracture risk: 1.6%. These are under the thresholds for treatment of 20% and 3%, respectively. Comments: the technical quality of the study is good, however, the spine is scoliotic and arthritic. Calcium accumulation in arthritic sites can confound the results of the bone density scan.  L3 vertebra had to be excluded from analysis due to degenerative changes. Evaluation for secondary causes should be considered if clinically indicated. Recommend optimizing calcium (1200 mg/day) and vitamin D (800 IU/day) intake. Followup: Repeat BMD is appropriate after 2 years or after 1-2 years if starting treatment. WHO criteria for diagnosis of osteoporosis in postmenopausal women and in men 11 y/o or older: - normal: T-score -1.0 to + 1.0 - osteopenia/low bone density: T-score between -2.5 and -1.0 - osteoporosis:  T-score below -2.5 - severe osteoporosis: T-score below -2.5 with history of fragility fracture Note: although not part of the WHO classification, the presence of a fragility fracture, regardless of the T-score, should be considered diagnostic of osteoporosis, provided other causes for the fracture have been excluded. Treatment: The National Osteoporosis Foundation recommends that treatment be considered in postmenopausal women and men age 53 or older with: 1. Hip or vertebral (clinical or morphometric) fracture 2. T-score of - 2.5 or lower at the spine or hip 3. 10-year fracture probability by FRAX of at least 20% for a major osteoporotic fracture and 3% for a hip fracture Philemon Kingdom, MD [facility]    Assessment & Plan:   Virgil was seen today for atrial fibrillation and benign prostatic hypertrophy.  Diagnoses and all orders for this visit:  BPH with obstruction/lower urinary tract symptoms- He has  worsening symptoms and a rising PSA.  I have asked him to start taking finasteride for symptom relief and to hopefully avoid the need for surgery or a urinary catheter in the future. -     PSA; Future -     Urinalysis, Routine w reflex microscopic; Future -     finasteride (PROSCAR) 5 MG tablet; Take 1 tablet (5 mg total) daily by mouth.  Calculus of kidney- His UA is normal.  I do not think his current symptoms are related to a stone. -     Urinalysis, Routine w reflex microscopic; Future  PSA elevation- as above -     PSA; Future  Essential hypertension- His blood pressure is well controlled.  Electrolytes and renal function are normal. -     CBC with Differential/Platelet; Future -     Basic metabolic panel; Future   I am having Michail Sermon. Rudnicki start on finasteride. I am also having him maintain his vitamin B-12, famotidine, metoprolol tartrate, rosuvastatin, acetaminophen, torsemide, ergocalciferol, losartan, FOSTEUM PLUS, chromagen, and XARELTO.  Meds ordered this encounter  Medications  . finasteride (PROSCAR) 5 MG tablet    Sig: Take 1 tablet (5 mg total) daily by mouth.    Dispense:  90 tablet    Refill:  1     Follow-up: Return in about 6 months (around 03/25/2018).  Scarlette Calico, MD

## 2017-10-06 IMAGING — DX DG WRIST COMPLETE 3+V*L*
4 series · 4 of 4 positions shown · non-contrast
Comparison: None.

CLINICAL DATA: Tripped in bedroom, left wrist pain after fall

EXAM:
LEFT WRIST - COMPLETE 3+ VIEW

[wrist pa]
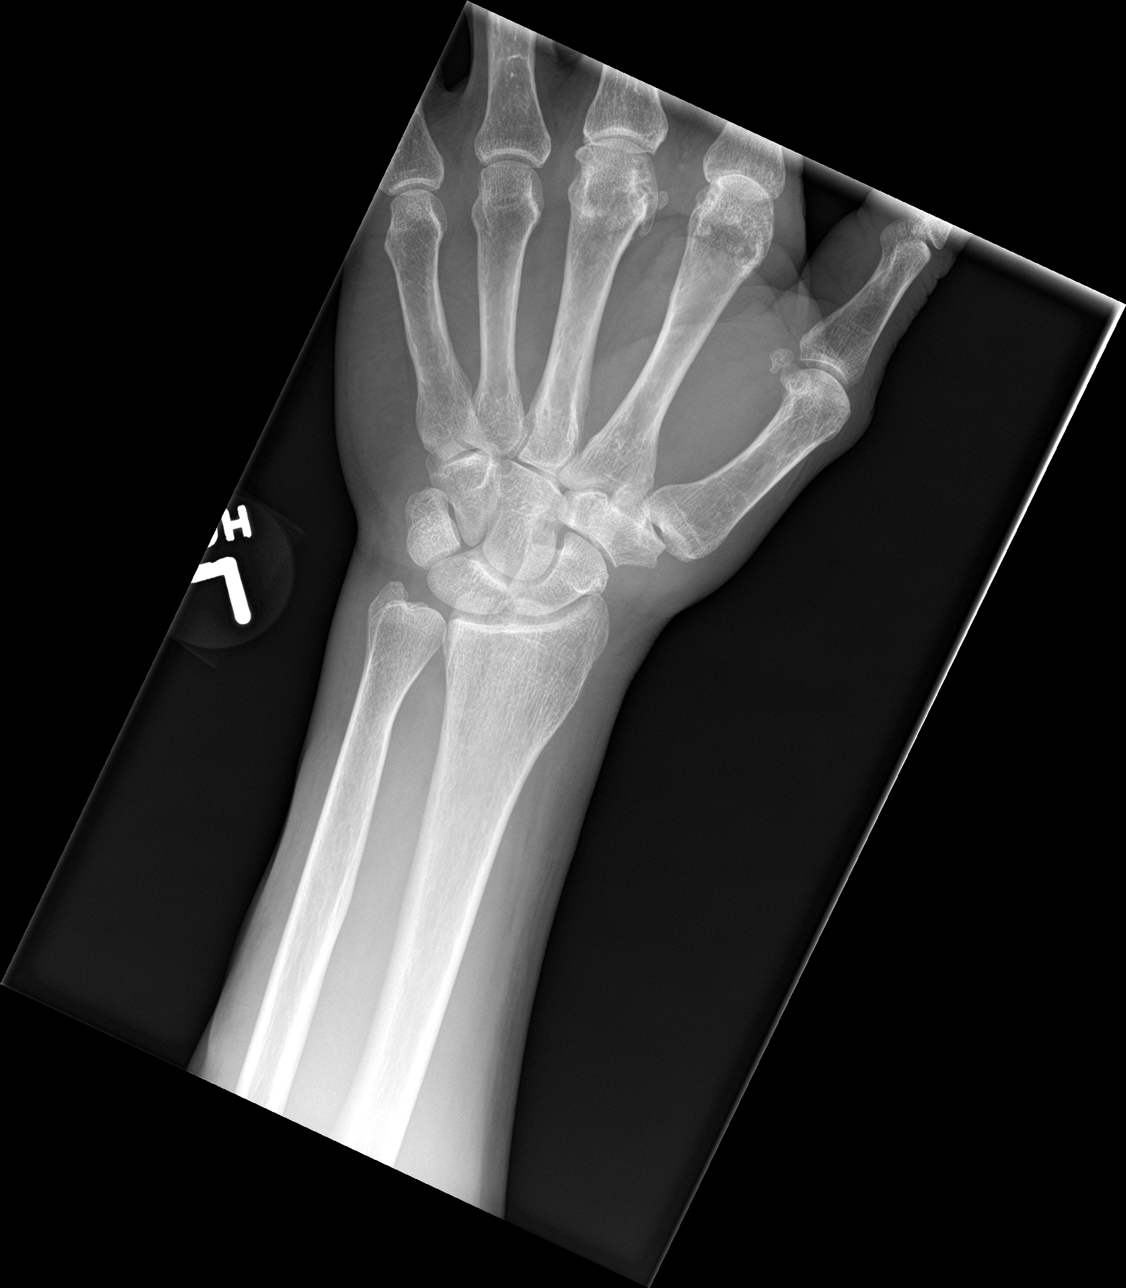

[wrist obl]
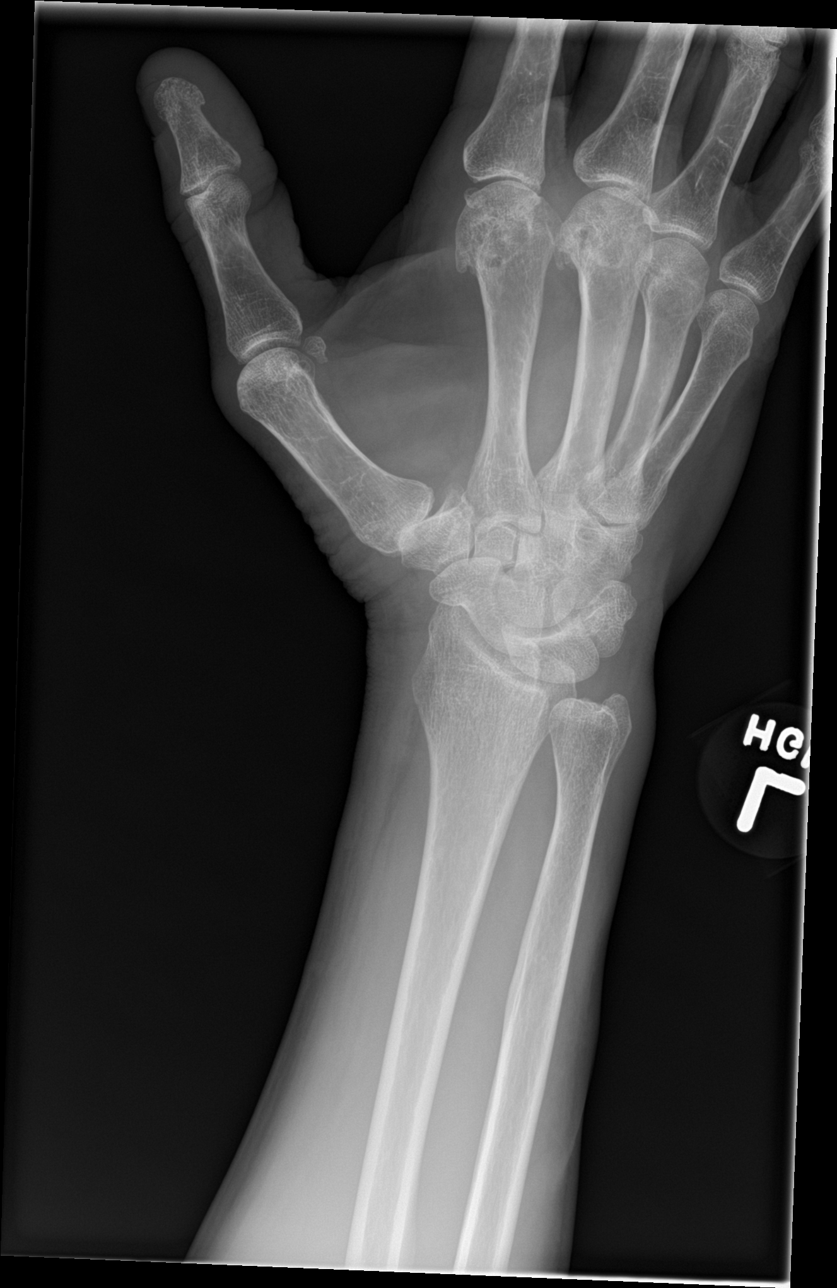

[wrist lat]
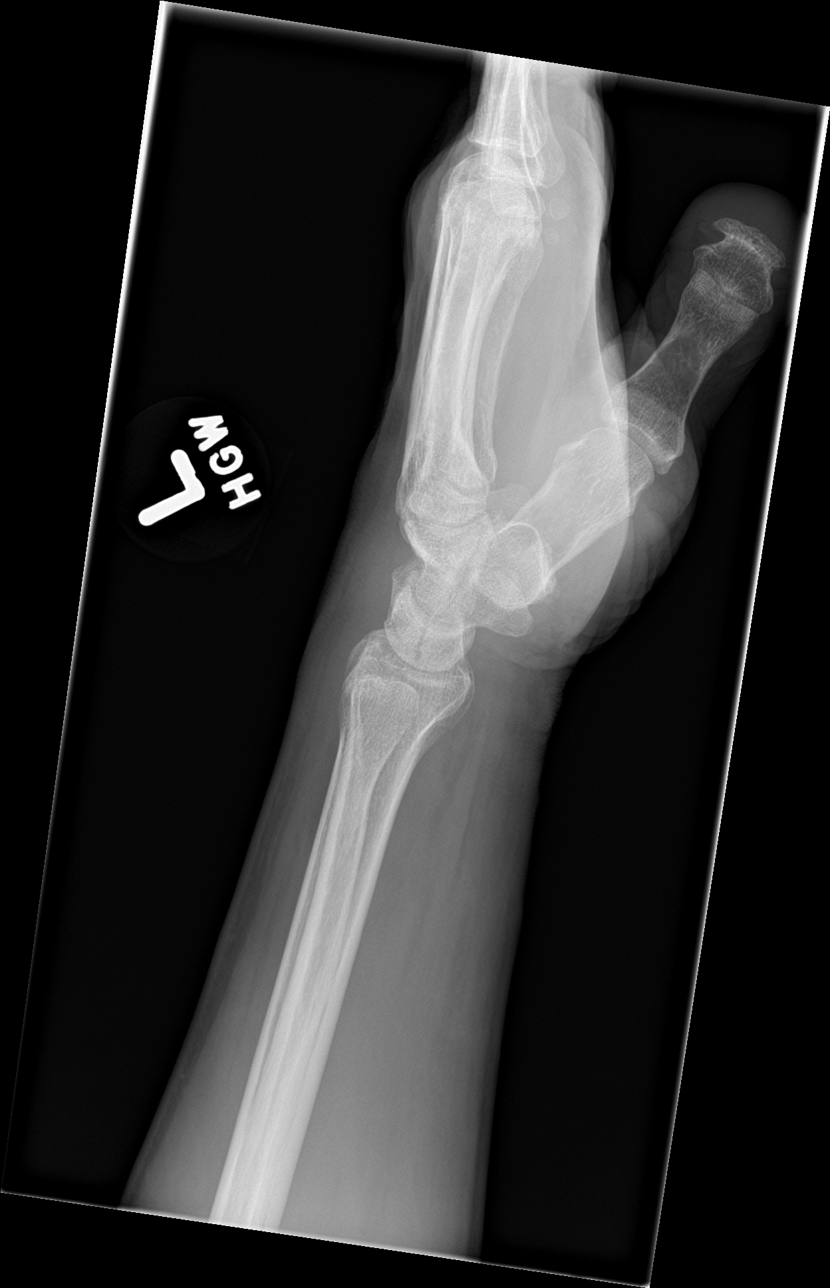

[navicular]
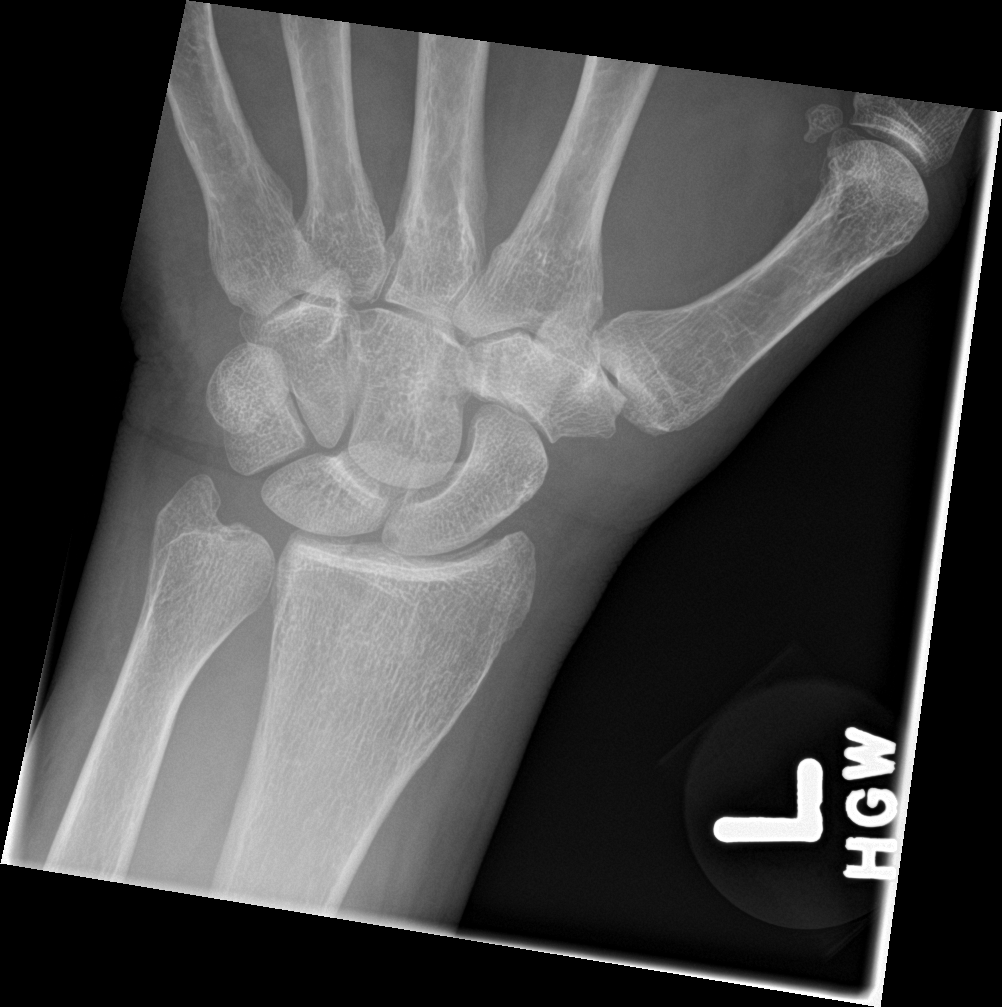

[4 of 4 positions shown; findings below may reference images not displayed]

FINDINGS: Mild first carpal metacarpal joint arthritis. Nondisplaced fracture
involves the articulating surface of the radius. There are no other
acute abnormalities.
IMPRESSION: Nondisplaced distal radius fracture

## 2017-10-18 ENCOUNTER — Other Ambulatory Visit: Payer: Self-pay

## 2017-11-07 ENCOUNTER — Other Ambulatory Visit: Payer: Self-pay

## 2017-11-07 MED ORDER — TORSEMIDE 20 MG PO TABS: 20.0000 mg | ORAL_TABLET | Freq: Two times a day (BID) | ORAL | 3 refills | Status: DC

## 2017-11-21 ENCOUNTER — Other Ambulatory Visit: Payer: Self-pay | Admitting: Internal Medicine

## 2017-11-21 DIAGNOSIS — I4819 Other persistent atrial fibrillation: Secondary | ICD-10-CM

## 2017-11-21 MED ORDER — RIVAROXABAN 15 MG PO TABS: ORAL_TABLET | ORAL | 0 refills | Status: DC

## 2017-11-21 NOTE — Telephone Encounter (Signed)
Copied from [place] [id number]. Topic: Quick Communication - Rx Refill/Question >> Nov 21, 2017  1:21 PM Aurelio Brash B wrote: Medication: XARELTO 15 MG TABS tablet   Has the patient contacted their pharmacy? yes   (Agent: If no, request that the patient contact the pharmacy for the refill.)   Preferred Pharmacy (with phone number or street name): [id number], [place] [address] [phone] (Phone) [phone] (Fax)     Agent: Please be advised that RX refills may take up to 3 business days. We ask that you follow-up with your pharmacy.

## 2017-11-21 NOTE — Telephone Encounter (Signed)
OV: 09/25/17 LR: 06/19/17 #90 0 refills Refilled for 3 months per protocol

## 2017-12-06 ENCOUNTER — Other Ambulatory Visit: Payer: Self-pay | Admitting: Physician Assistant

## 2017-12-06 DIAGNOSIS — I4819 Other persistent atrial fibrillation: Secondary | ICD-10-CM

## 2017-12-06 DIAGNOSIS — I10 Essential (primary) hypertension: Secondary | ICD-10-CM

## 2018-01-15 DIAGNOSIS — M9902 Segmental and somatic dysfunction of thoracic region: Secondary | ICD-10-CM | POA: Diagnosis not present

## 2018-01-15 DIAGNOSIS — M5134 Other intervertebral disc degeneration, thoracic region: Secondary | ICD-10-CM | POA: Diagnosis not present

## 2018-01-15 DIAGNOSIS — M5136 Other intervertebral disc degeneration, lumbar region: Secondary | ICD-10-CM | POA: Diagnosis not present

## 2018-01-15 DIAGNOSIS — M9903 Segmental and somatic dysfunction of lumbar region: Secondary | ICD-10-CM | POA: Diagnosis not present

## 2018-01-16 DIAGNOSIS — M5136 Other intervertebral disc degeneration, lumbar region: Secondary | ICD-10-CM | POA: Diagnosis not present

## 2018-01-16 DIAGNOSIS — M5134 Other intervertebral disc degeneration, thoracic region: Secondary | ICD-10-CM | POA: Diagnosis not present

## 2018-01-16 DIAGNOSIS — M9902 Segmental and somatic dysfunction of thoracic region: Secondary | ICD-10-CM | POA: Diagnosis not present

## 2018-01-16 DIAGNOSIS — M9903 Segmental and somatic dysfunction of lumbar region: Secondary | ICD-10-CM | POA: Diagnosis not present

## 2018-01-18 DIAGNOSIS — M9903 Segmental and somatic dysfunction of lumbar region: Secondary | ICD-10-CM | POA: Diagnosis not present

## 2018-01-18 DIAGNOSIS — M5136 Other intervertebral disc degeneration, lumbar region: Secondary | ICD-10-CM | POA: Diagnosis not present

## 2018-01-18 DIAGNOSIS — M5134 Other intervertebral disc degeneration, thoracic region: Secondary | ICD-10-CM | POA: Diagnosis not present

## 2018-01-18 DIAGNOSIS — M9902 Segmental and somatic dysfunction of thoracic region: Secondary | ICD-10-CM | POA: Diagnosis not present

## 2018-01-21 DIAGNOSIS — M5134 Other intervertebral disc degeneration, thoracic region: Secondary | ICD-10-CM | POA: Diagnosis not present

## 2018-01-21 DIAGNOSIS — M5136 Other intervertebral disc degeneration, lumbar region: Secondary | ICD-10-CM | POA: Diagnosis not present

## 2018-01-21 DIAGNOSIS — M9903 Segmental and somatic dysfunction of lumbar region: Secondary | ICD-10-CM | POA: Diagnosis not present

## 2018-01-21 DIAGNOSIS — M9902 Segmental and somatic dysfunction of thoracic region: Secondary | ICD-10-CM | POA: Diagnosis not present

## 2018-01-23 DIAGNOSIS — M5136 Other intervertebral disc degeneration, lumbar region: Secondary | ICD-10-CM | POA: Diagnosis not present

## 2018-01-23 DIAGNOSIS — M5134 Other intervertebral disc degeneration, thoracic region: Secondary | ICD-10-CM | POA: Diagnosis not present

## 2018-01-23 DIAGNOSIS — M9902 Segmental and somatic dysfunction of thoracic region: Secondary | ICD-10-CM | POA: Diagnosis not present

## 2018-01-23 DIAGNOSIS — M9903 Segmental and somatic dysfunction of lumbar region: Secondary | ICD-10-CM | POA: Diagnosis not present

## 2018-01-25 DIAGNOSIS — M9902 Segmental and somatic dysfunction of thoracic region: Secondary | ICD-10-CM | POA: Diagnosis not present

## 2018-01-25 DIAGNOSIS — M5136 Other intervertebral disc degeneration, lumbar region: Secondary | ICD-10-CM | POA: Diagnosis not present

## 2018-01-25 DIAGNOSIS — M5134 Other intervertebral disc degeneration, thoracic region: Secondary | ICD-10-CM | POA: Diagnosis not present

## 2018-01-25 DIAGNOSIS — M9903 Segmental and somatic dysfunction of lumbar region: Secondary | ICD-10-CM | POA: Diagnosis not present

## 2018-01-28 DIAGNOSIS — M9903 Segmental and somatic dysfunction of lumbar region: Secondary | ICD-10-CM | POA: Diagnosis not present

## 2018-01-28 DIAGNOSIS — M9902 Segmental and somatic dysfunction of thoracic region: Secondary | ICD-10-CM | POA: Diagnosis not present

## 2018-01-28 DIAGNOSIS — M5134 Other intervertebral disc degeneration, thoracic region: Secondary | ICD-10-CM | POA: Diagnosis not present

## 2018-01-28 DIAGNOSIS — M5136 Other intervertebral disc degeneration, lumbar region: Secondary | ICD-10-CM | POA: Diagnosis not present

## 2018-01-31 DIAGNOSIS — M9903 Segmental and somatic dysfunction of lumbar region: Secondary | ICD-10-CM | POA: Diagnosis not present

## 2018-01-31 DIAGNOSIS — M5134 Other intervertebral disc degeneration, thoracic region: Secondary | ICD-10-CM | POA: Diagnosis not present

## 2018-01-31 DIAGNOSIS — M5136 Other intervertebral disc degeneration, lumbar region: Secondary | ICD-10-CM | POA: Diagnosis not present

## 2018-01-31 DIAGNOSIS — M9902 Segmental and somatic dysfunction of thoracic region: Secondary | ICD-10-CM | POA: Diagnosis not present

## 2018-02-05 DIAGNOSIS — M5134 Other intervertebral disc degeneration, thoracic region: Secondary | ICD-10-CM | POA: Diagnosis not present

## 2018-02-05 DIAGNOSIS — M9902 Segmental and somatic dysfunction of thoracic region: Secondary | ICD-10-CM | POA: Diagnosis not present

## 2018-02-05 DIAGNOSIS — M5136 Other intervertebral disc degeneration, lumbar region: Secondary | ICD-10-CM | POA: Diagnosis not present

## 2018-02-05 DIAGNOSIS — M9903 Segmental and somatic dysfunction of lumbar region: Secondary | ICD-10-CM | POA: Diagnosis not present

## 2018-02-07 ENCOUNTER — Other Ambulatory Visit: Payer: Self-pay | Admitting: Internal Medicine

## 2018-02-07 ENCOUNTER — Other Ambulatory Visit: Payer: Self-pay | Admitting: Physician Assistant

## 2018-02-07 DIAGNOSIS — I4819 Other persistent atrial fibrillation: Secondary | ICD-10-CM

## 2018-02-07 DIAGNOSIS — I10 Essential (primary) hypertension: Secondary | ICD-10-CM

## 2018-02-08 DIAGNOSIS — M9902 Segmental and somatic dysfunction of thoracic region: Secondary | ICD-10-CM | POA: Diagnosis not present

## 2018-02-08 DIAGNOSIS — M5136 Other intervertebral disc degeneration, lumbar region: Secondary | ICD-10-CM | POA: Diagnosis not present

## 2018-02-08 DIAGNOSIS — M5134 Other intervertebral disc degeneration, thoracic region: Secondary | ICD-10-CM | POA: Diagnosis not present

## 2018-02-08 DIAGNOSIS — M9903 Segmental and somatic dysfunction of lumbar region: Secondary | ICD-10-CM | POA: Diagnosis not present

## 2018-02-13 DIAGNOSIS — M9902 Segmental and somatic dysfunction of thoracic region: Secondary | ICD-10-CM | POA: Diagnosis not present

## 2018-02-13 DIAGNOSIS — M9903 Segmental and somatic dysfunction of lumbar region: Secondary | ICD-10-CM | POA: Diagnosis not present

## 2018-02-13 DIAGNOSIS — M5136 Other intervertebral disc degeneration, lumbar region: Secondary | ICD-10-CM | POA: Diagnosis not present

## 2018-02-13 DIAGNOSIS — M5134 Other intervertebral disc degeneration, thoracic region: Secondary | ICD-10-CM | POA: Diagnosis not present

## 2018-02-20 DIAGNOSIS — M9903 Segmental and somatic dysfunction of lumbar region: Secondary | ICD-10-CM | POA: Diagnosis not present

## 2018-02-20 DIAGNOSIS — M5136 Other intervertebral disc degeneration, lumbar region: Secondary | ICD-10-CM | POA: Diagnosis not present

## 2018-02-20 DIAGNOSIS — M9902 Segmental and somatic dysfunction of thoracic region: Secondary | ICD-10-CM | POA: Diagnosis not present

## 2018-02-20 DIAGNOSIS — M5134 Other intervertebral disc degeneration, thoracic region: Secondary | ICD-10-CM | POA: Diagnosis not present

## 2018-02-26 ENCOUNTER — Encounter: Payer: Self-pay | Admitting: Internal Medicine

## 2018-02-27 DIAGNOSIS — M9902 Segmental and somatic dysfunction of thoracic region: Secondary | ICD-10-CM | POA: Diagnosis not present

## 2018-02-27 DIAGNOSIS — M9903 Segmental and somatic dysfunction of lumbar region: Secondary | ICD-10-CM | POA: Diagnosis not present

## 2018-02-27 DIAGNOSIS — M5136 Other intervertebral disc degeneration, lumbar region: Secondary | ICD-10-CM | POA: Diagnosis not present

## 2018-02-27 DIAGNOSIS — M5134 Other intervertebral disc degeneration, thoracic region: Secondary | ICD-10-CM | POA: Diagnosis not present

## 2018-03-06 DIAGNOSIS — M9902 Segmental and somatic dysfunction of thoracic region: Secondary | ICD-10-CM | POA: Diagnosis not present

## 2018-03-06 DIAGNOSIS — M5134 Other intervertebral disc degeneration, thoracic region: Secondary | ICD-10-CM | POA: Diagnosis not present

## 2018-03-06 DIAGNOSIS — M9903 Segmental and somatic dysfunction of lumbar region: Secondary | ICD-10-CM | POA: Diagnosis not present

## 2018-03-06 DIAGNOSIS — M5136 Other intervertebral disc degeneration, lumbar region: Secondary | ICD-10-CM | POA: Diagnosis not present

## 2018-03-08 ENCOUNTER — Ambulatory Visit (INDEPENDENT_AMBULATORY_CARE_PROVIDER_SITE_OTHER): Payer: Medicare Other | Admitting: Internal Medicine

## 2018-03-08 ENCOUNTER — Encounter: Payer: Self-pay | Admitting: Internal Medicine

## 2018-03-08 VITALS — BP 118/68 | HR 76 | Ht 65.0 in | Wt 166.0 lb

## 2018-03-08 DIAGNOSIS — I481 Persistent atrial fibrillation: Secondary | ICD-10-CM

## 2018-03-08 DIAGNOSIS — I4819 Other persistent atrial fibrillation: Secondary | ICD-10-CM

## 2018-03-08 DIAGNOSIS — R600 Localized edema: Secondary | ICD-10-CM | POA: Diagnosis not present

## 2018-03-08 NOTE — Patient Instructions (Signed)

## 2018-03-08 NOTE — Progress Notes (Signed)
HPI [name] returns today for ongoing evaluation and management of his atrial fib. 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 The patient also has dylipidemia. In the interim, he has managed to do well. He remains active. Allergies  Allergen Reactions  . Amlodipine Other (See Comments)    Edema   . Welchol [Colesevelam Hcl] Other (See Comments)    Stomach cramps  . Other Swelling    Hair dye - facial swelling   . Prolia [Denosumab] Other (See Comments)    Pain at injection sight     Current Outpatient Medications  Medication Sig Dispense Refill  . acetaminophen (TYLENOL) 500 MG tablet Take 500-1,000 mg by mouth every 6 (six) hours as needed (for pain from muscle spasms).    . Dietary Management Product (FOSTEUM PLUS) CAPS Take 1 capsule by mouth 2 (two) times daily. 60 capsule 11  . ergocalciferol (VITAMIN D2) 50000 units capsule Take 50,000 Units by mouth once a week. On Mondays and Thursday    . famotidine (PEPCID) 20 MG tablet Take 20 mg by mouth at bedtime as needed for heartburn or indigestion.     . finasteride (PROSCAR) 5 MG tablet Take 1 tablet (5 mg total) daily by mouth. 90 tablet 1  . losartan (COZAAR) 100 MG tablet TAKE 1 TABLET BY MOUTH ONCE DAILY 90 tablet 0  . metoprolol tartrate (LOPRESSOR) 25 MG tablet Take 1 tablet (25 mg total) by mouth 2 (two) times daily. Please make yearly appt with Dr. Lovena Le for April. 1st attempt 180 tablet 0  . rosuvastatin (CRESTOR) 10 MG tablet take 1 tablet by mouth once daily 90 tablet 1  . torsemide (DEMADEX) 20 MG tablet Take 1 tablet (20 mg total) by mouth 2 (two) times daily. 180 tablet 3  . vitamin B-12 (CYANOCOBALAMIN) 250 MCG tablet Take 250 mcg by mouth daily.      Alveda Reasons 15 MG TABS tablet TAKE 1 TABLET BY MOUTH ONCE DAILY WITH SUPPER 90 tablet 0   No current facility-administered medications for this visit.      Past Medical History:  Diagnosis Date  . A-fib ([place]) 11/2016     Tx with po meds at this time  . Anemia    Microcytic with Fe Sat 4% 10/31/10  . Atypical chest pain   . Cataract    removed bilaterally  . Chronic rhinitis   . Cough   . Diverticulosis   . Dyspnea    " WITH MY AFIB "  . GERD (gastroesophageal reflux disease)   . Hiatal hernia   . Hiatal hernia   . HLD (hyperlipidemia)   . HTN (hypertension)   . Interstitial lung disease ([place])   . Kidney stones   . Osteoarthritis   . Sarcoidosis 8/99   Transbronchial biopsy, taking daily prednisone since  November 08 on and off since 1999 w/cough every time he relapses off it for more than several weeks  . 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   Past Surgical History:  Procedure Laterality Date  . CARDIOVERSION N/A 12/21/2016   Procedure: CARDIOVERSION;  Surgeon: Evans Lance, MD;  Location: [facility];  Service: Cardiovascular;  Laterality: N/A;  . CARDIOVERSION N/A 01/31/2017   Procedure: CARDIOVERSION;  Surgeon: Pixie Casino, MD;  Location: [place] [facility];  Service:  Cardiovascular;  Laterality: N/A;  . CATARACT EXTRACTION Left 10/14/2013  . CATARACT EXTRACTION Right 11/24/2013  . CERVICAL FUSION     pt states he did not have a cervical fusion, just a biopsy   . COLONOSCOPY  01/12/2006   tics only   . INGUINAL HERNIA REPAIR     right  . LIPOMA EXCISION Left 09/16/2004   forearm  . spinal cord biopsyOther]  1996   sarcoid     Family History  Problem Relation Age of Onset  . Asthma Mother   . Diabetes Mother   . Arthritis Paternal Grandmother   . Heart disease Paternal Grandmother   . Hypertension Father        Fathers family most  . Prostate cancer Father   . Kidney disease Father   . Colon polyps Sister   . Multiple myeloma Sister   . Heart disease Paternal Uncle   . Kidney disease Sister   . Sarcoidosis Neg Hx   . Other Neg Hx        osteoporosis  .  Colon cancer Neg Hx   . Esophageal cancer Neg Hx   . Rectal cancer Neg Hx   . Stomach cancer Neg Hx      Social History   Socioeconomic History  . Marital status: Single    Spouse name: Not on file  . Number of children: 0  . Years of education: Not on file  . Highest education level: Not on file  Occupational History  . Occupation: Retired    Fish farm manager: [employer]  . Financial resource strain: Not on file  . Food insecurity:    Worry: Not on file    Inability: Not on file  . Transportation needs:    Medical: Not on file    Non-medical: Not on file  Tobacco Use  . Smoking status: Never Smoker  . Smokeless tobacco: Never Used  Substance and Sexual Activity  . Alcohol use: No    Alcohol/week: 0.0 oz  . Drug use: No  . Sexual activity: Not Currently  Lifestyle  . Physical activity:    Days per week: Not on file    Minutes per session: Not on file  . Stress: Not on file  Relationships  . Social connections:    Talks on phone: Not on file    Gets together: Not on file    Attends religious service: Not on file    Active member of club or organization: Not on file    Attends meetings of clubs or organizations: Not on file    Relationship status: Not on file  . Intimate partner violence:    Fear of current or ex partner: Not on file    Emotionally abused: Not on file    Physically abused: Not on file    Forced sexual activity: Not on file  Other Topics Concern  . Not on file  Social History Narrative   Regular Exercise -  NO     BP 118/68   Pulse 76   Ht '5\' 5"'  (1.651 m)   Wt 166 lb (75.3 kg)   SpO2 (!) 84%   BMI 27.62 kg/m   Physical Exam:  Well appearing NAD HEENT: Unremarkable Neck:  No JVD, no thyromegally Lymphatics:  No adenopathy Back:  No CVA tenderness Lungs:  Clear except for scattered rales. HEART:  Regular rate rhythm, no murmurs, no rubs, no clicks Abd:  soft, positive bowel sounds, no organomegally, no rebound,  no  guarding Ext:  2 plus pulses, trace peripheral edema, no cyanosis, no clubbing Skin:  No rashes no nodules Neuro:  CN II through XII intact, motor grossly intact  EKG - atrial fib  Assess/Plan: 1. Atrial fib - his rate is well controlled. We discussed rate vs rhythm control. He will continue systemic anti-coagulation 2. Chronic diastolic heart failure - he has some edema and notes that when he goes without the torsemide, he will get more sob.  3. HTN - his blood pressure is controlled nicely on his medical therapy. Will follow.  Mikle Bosworth.D.

## 2018-03-20 DIAGNOSIS — M9903 Segmental and somatic dysfunction of lumbar region: Secondary | ICD-10-CM | POA: Diagnosis not present

## 2018-03-20 DIAGNOSIS — M9905 Segmental and somatic dysfunction of pelvic region: Secondary | ICD-10-CM | POA: Diagnosis not present

## 2018-03-20 DIAGNOSIS — M9904 Segmental and somatic dysfunction of sacral region: Secondary | ICD-10-CM | POA: Diagnosis not present

## 2018-03-20 DIAGNOSIS — M5136 Other intervertebral disc degeneration, lumbar region: Secondary | ICD-10-CM | POA: Diagnosis not present

## 2018-03-25 IMAGING — DX DG CHEST 2V
2 series · 2 of 2 positions shown · non-contrast
Comparison: January 12, 2015

CLINICAL DATA: Cough for 1 month.  History of sarcoid.

EXAM:
CHEST  2 VIEW

[chest pa]
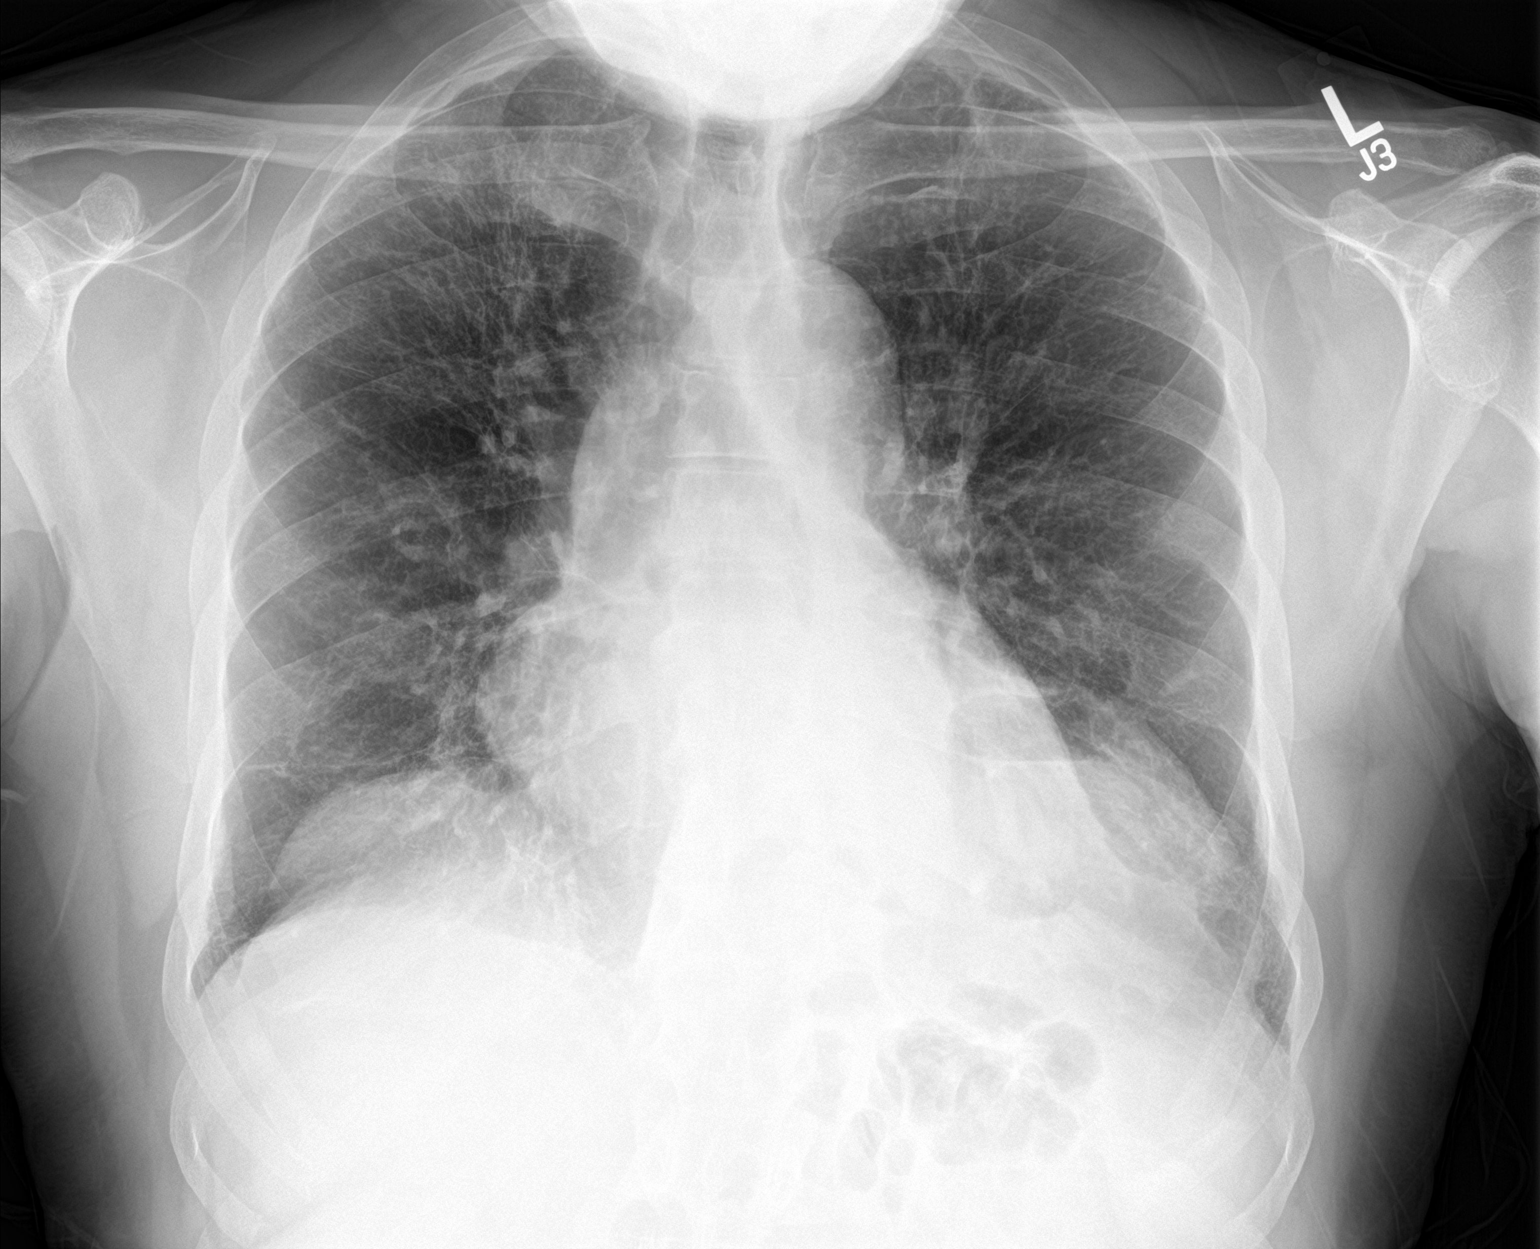

[chest lat]
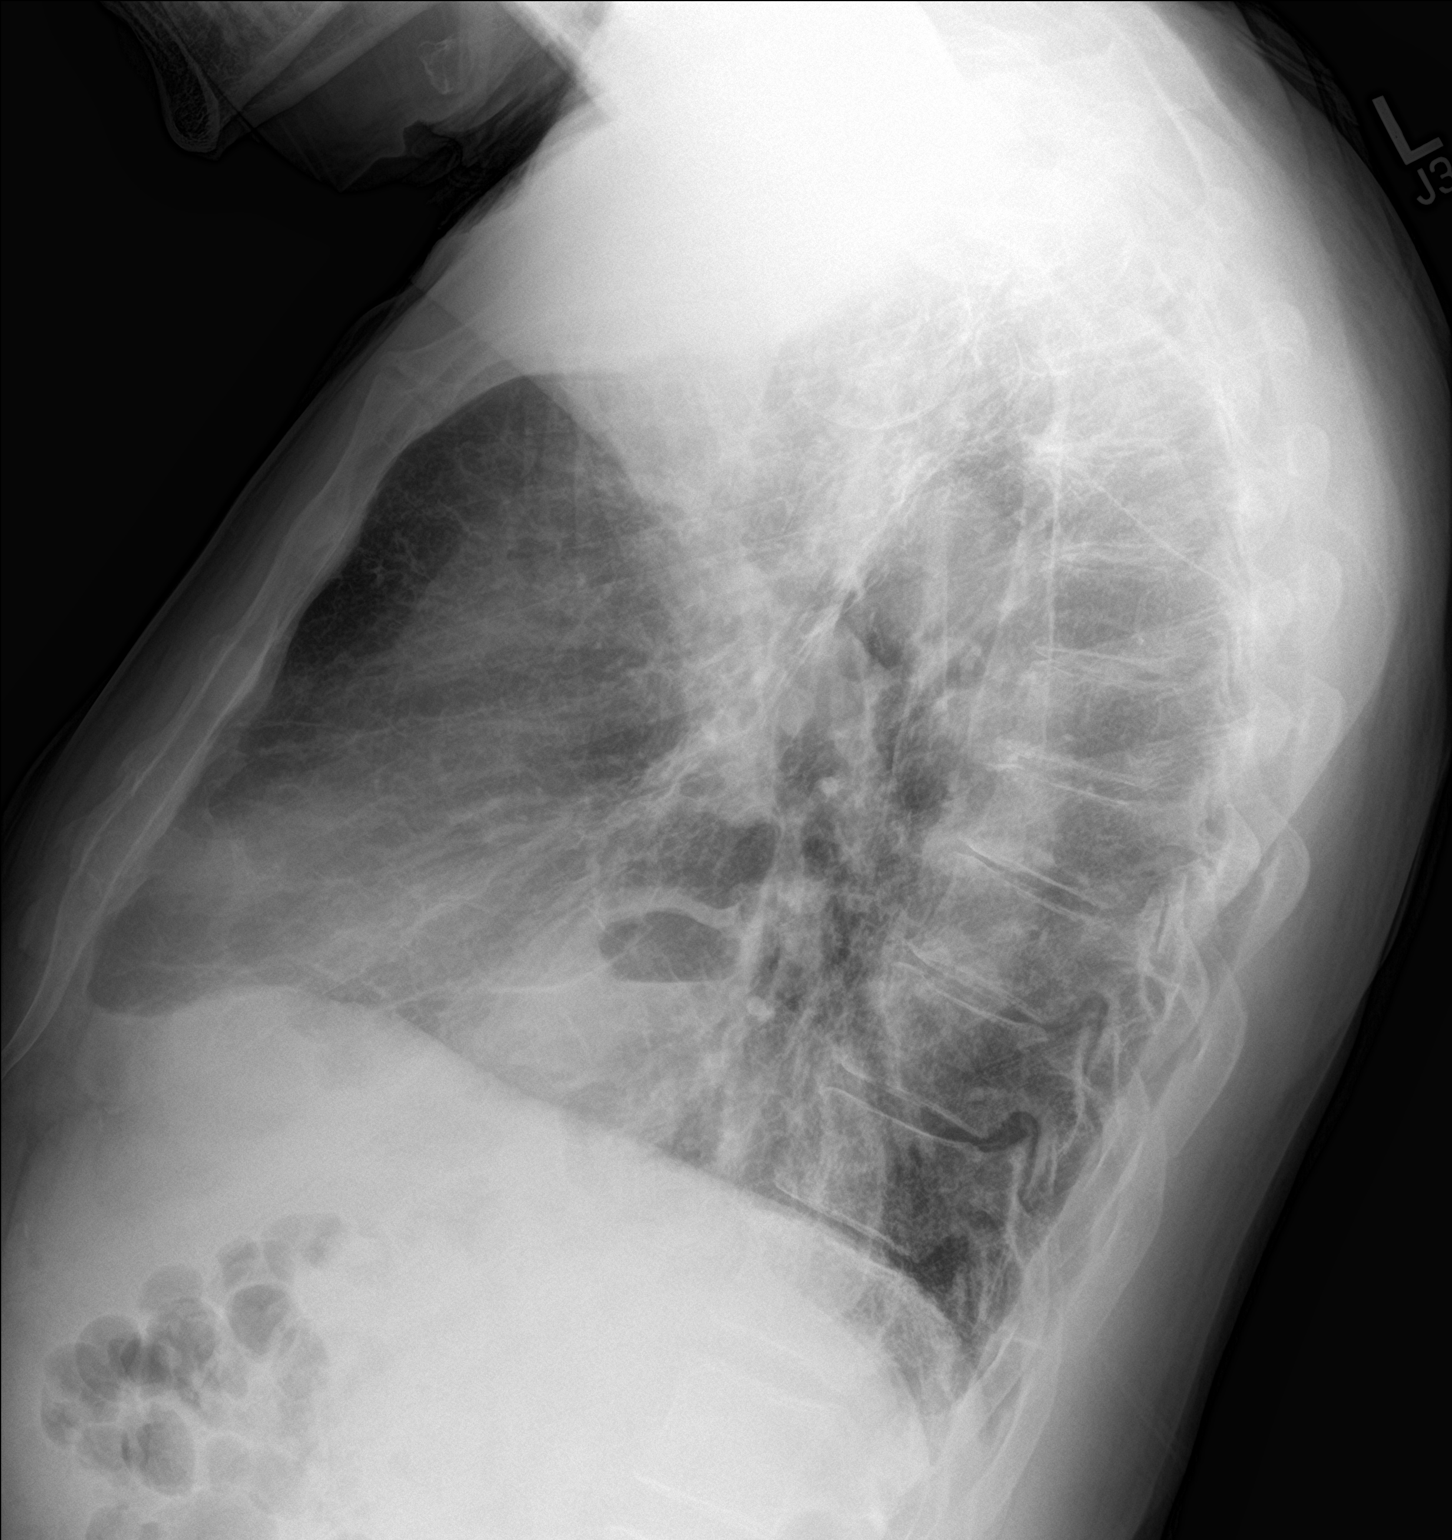

[2 of 2 positions shown; findings below may reference images not displayed]

FINDINGS: The heart size and mediastinal contours are stable. Bilateral hilum
are stable consistent with patient's known sarcoid. There is mild
scarring of bilateral apices unchanged. There is no focal
infiltrate, pulmonary edema, or pleural effusion. There is a large
hiatal hernia. The visualized skeletal structures are unremarkable.
IMPRESSION: No active cardiopulmonary disease. Chronic changes stable compared
to prior chest x-ray January 2015.

## 2018-04-03 DIAGNOSIS — M9903 Segmental and somatic dysfunction of lumbar region: Secondary | ICD-10-CM | POA: Diagnosis not present

## 2018-04-03 DIAGNOSIS — M5136 Other intervertebral disc degeneration, lumbar region: Secondary | ICD-10-CM | POA: Diagnosis not present

## 2018-04-03 DIAGNOSIS — M9905 Segmental and somatic dysfunction of pelvic region: Secondary | ICD-10-CM | POA: Diagnosis not present

## 2018-04-03 DIAGNOSIS — M9904 Segmental and somatic dysfunction of sacral region: Secondary | ICD-10-CM | POA: Diagnosis not present

## 2018-04-05 ENCOUNTER — Emergency Department (HOSPITAL_COMMUNITY)
Admission: EM | Admit: 2018-04-05 | Discharge: 2018-04-05 | Disposition: A | Discharge status: Home / Self Care | Payer: Medicare Other | Attending: Emergency Medicine | Admitting: Emergency Medicine

## 2018-04-05 ENCOUNTER — Encounter (HOSPITAL_COMMUNITY): Payer: Self-pay | Admitting: Emergency Medicine

## 2018-04-05 DIAGNOSIS — Z79899 Other long term (current) drug therapy: Secondary | ICD-10-CM | POA: Insufficient documentation

## 2018-04-05 DIAGNOSIS — I10 Essential (primary) hypertension: Secondary | ICD-10-CM | POA: Insufficient documentation

## 2018-04-05 DIAGNOSIS — R58 Hemorrhage, not elsewhere classified: Secondary | ICD-10-CM | POA: Diagnosis not present

## 2018-04-05 DIAGNOSIS — S99922A Unspecified injury of left foot, initial encounter: Secondary | ICD-10-CM | POA: Diagnosis not present

## 2018-04-05 NOTE — Discharge Instructions (Signed)
If you experience any more bleeding, apply constant steady pressure and elevate for 10 minutes.  If you are unable to control the bleeding, you may present to an urgent care or the emergency department or your primary care office for reevaluation.  Return to the ED sooner if any concerning signs or symptoms develop.

## 2018-04-05 NOTE — ED Provider Notes (Signed)
[facility] EMERGENCY DEPARTMENT Provider Note   CSN: [id number] Arrival date & time: 04/05/18  1730     History   Chief Complaint Chief Complaint  Patient presents with  . Bleeding Foot    HPI Richard Blake is a 72 y.o. male with history of A. fib, GERD, hypertension, hyperlipidemia, sarcoidosis on chronic anticoagulation with Xarelto presents for evaluation of acute onset, resolved episode of bleeding to the dorsum of the left foot.  He states that 1 hour prior to my assessment he was getting into a bath and noticed blood in the water.  He notes that the blood was coming from a very small cut to the dorsum of the left foot.  He denies any known injury and denies any bleeding prior to his bath.  He applied pressure for several minutes without relief of the bleeding.  He then applied a Band-Aid without relief of the bleeding.  He then applied surgical tape which did alleviate the bleeding.  He denies any bleeding at this time.  He denies any lightheadedness or shortness of breath.  He does not think that he lost a lot of blood.  He denies any pain at this time.  He states that he presented to the ED "just to get checked out ".  He has been compliant with his Xarelto but did not have his dose today.  The history is provided by the patient.    Past Medical History:  Diagnosis Date  . A-fib ([place]) 11/2016   Tx with po meds at this time  . Anemia    Microcytic with Fe Sat 4% 10/31/10  . Atypical chest pain   . Cataract    removed bilaterally  . Chronic rhinitis   . Cough   . Diverticulosis   . Dyspnea    " WITH MY AFIB "  . GERD (gastroesophageal reflux disease)   . Hiatal hernia   . Hiatal hernia   . HLD (hyperlipidemia)   . HTN (hypertension)   . Interstitial lung disease ([place])   . Kidney stones   . Osteoarthritis   . Sarcoidosis 8/99   Transbronchial biopsy, taking daily prednisone since  November 08 on and off since 1999 w/cough every time he relapses  off it for more than several weeks  . Undescended right testicle    "shrivled" in childhood from 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    Patient Active Problem List   Diagnosis Date Noted  . Visit for monitoring Tikosyn therapy 01/29/2017  . Persistent atrial fibrillation ([place]) 11/23/2016  . Colon cancer screening 01/19/2016  . Urolithiasis 01/20/2015  . Vitamin D deficiency 01/20/2015  . Hiatal hernia 01/14/2015  . PSA elevation 02/19/2014  . Hyperlipidemia with target LDL less than 130 01/22/2013  . Osteoporosis 01/22/2013  . Routine general medical examination at a health care facility 01/28/2012  . 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 Iron deficiency anemia 10/31/2010  . Essential hypertension 10/31/2010  . OSTEOARTHRITIS 10/31/2010  . Sarcoidosis 09/12/2007  . INTERSTITIAL LUNG DISEASE 09/12/2007    Past Surgical History:  Procedure Laterality Date  . CARDIOVERSION N/A 12/21/2016   Procedure: CARDIOVERSION;  Surgeon: Evans Lance, MD;  Location: [facility];  Service: Cardiovascular;  Laterality: N/A;  . CARDIOVERSION N/A 01/31/2017   Procedure: CARDIOVERSION;  Surgeon: Pixie Casino, MD;  Location: [facility] ENDOSCOPY;  Service: Cardiovascular;  Laterality: N/A;  . CATARACT EXTRACTION Left 10/14/2013  . CATARACT EXTRACTION Right 11/24/2013  . CERVICAL FUSION  pt states he did not have a cervical fusion, just a biopsy   . COLONOSCOPY  01/12/2006   tics only   . INGUINAL HERNIA REPAIR     right  . LIPOMA EXCISION Left 09/16/2004   forearm  . spinal cord biopsyOther]  1996   sarcoid        Home Medications    Prior to Admission medications   Medication Sig Start Date End Date Taking? Authorizing Provider  acetaminophen (TYLENOL) 500 MG tablet Take 500-1,000 mg by mouth every 6 (six) hours as needed (for pain from muscle spasms).    [provider]  Dietary Management Product (FOSTEUM PLUS) CAPS Take 1 capsule by mouth 2 (two) times daily. 02/13/17    Janith Lima, MD  ergocalciferol (VITAMIN D2) 50000 units capsule Take 50,000 Units by mouth once a week. On Mondays and Thursday    [provider]  famotidine (PEPCID) 20 MG tablet Take 20 mg by mouth at bedtime as needed for heartburn or indigestion.     [provider]  finasteride (PROSCAR) 5 MG tablet Take 1 tablet (5 mg total) daily by mouth. 09/25/17   Janith Lima, MD  losartan (COZAAR) 100 MG tablet TAKE 1 TABLET BY MOUTH ONCE DAILY 02/08/18   Bhagat, Bhavinkumar, PA  metoprolol tartrate (LOPRESSOR) 25 MG tablet Take 1 tablet (25 mg total) by mouth 2 (two) times daily. Please make yearly appt with Dr. Lovena Le for April. 1st attempt 12/06/17   Leanor Kail, PA  rosuvastatin (CRESTOR) 10 MG tablet take 1 tablet by mouth once daily 01/18/17   Janith Lima, MD  torsemide (DEMADEX) 20 MG tablet Take 1 tablet (20 mg total) by mouth 2 (two) times daily. 11/07/17   Evans Lance, MD  vitamin B-12 (CYANOCOBALAMIN) 250 MCG tablet Take 250 mcg by mouth daily.      [provider]  XARELTO 15 MG TABS tablet TAKE 1 TABLET BY MOUTH ONCE DAILY WITH SUPPER 02/08/18   Janith Lima, MD  omeprazole (PRILOSEC) 20 MG capsule Take 20 mg by mouth daily.    01/28/12  [provider]    Family History Family History  Problem Relation Age of Onset  . Asthma Mother   . Diabetes Mother   . Arthritis Paternal Grandmother   . Heart disease Paternal Grandmother   . Hypertension Father        Fathers family most  . Prostate cancer Father   . Kidney disease Father   . Colon polyps Sister   . Multiple myeloma Sister   . Heart disease Paternal Uncle   . Kidney disease Sister   . Sarcoidosis Neg Hx   . Other Neg Hx        osteoporosis  . Colon cancer Neg Hx   . Esophageal cancer Neg Hx   . Rectal cancer Neg Hx   . Stomach cancer Neg Hx     Social History Social History   Tobacco Use  . Smoking status: Never Smoker  . Smokeless tobacco: Never Used    Substance Use Topics  . Alcohol use: No    Alcohol/week: 0.0 oz  . Drug use: No     Allergies   Amlodipine; Welchol [colesevelam hcl]; Other; and Prolia [denosumab]   Review of Systems Review of Systems  Musculoskeletal: Negative for arthralgias.  Skin: Positive for wound.  Neurological: Negative for syncope, weakness and numbness.  Hematological: Bruises/bleeds easily.     Physical Exam Updated Vital Signs  BP 124/70 (BP Location: Right Arm)   Pulse 73   Temp 98.8 F (37.1 C) (Oral)   Resp 16   SpO2 100%   Physical Exam  Constitutional: He appears well-developed and well-nourished. No distress.  HENT:  Head: Normocephalic and atraumatic.  Eyes: Conjunctivae are normal. Right eye exhibits no discharge. Left eye exhibits no discharge.  Neck: No JVD present. No tracheal deviation present.  Cardiovascular: Normal rate and intact distal pulses.  2+ DP/PT pulses bilaterally, trace nonpitting lower extremity edema bilaterally, patient states this is chronic and unchanged.  Pulmonary/Chest: Effort normal.  Abdominal: He exhibits no distension.  Musculoskeletal: Normal range of motion. He exhibits no edema.  5/5 strength of bilateral lower extremities with plantarflexion and dorsiflexion  Neurological: He is alert.  Fluent speech, no facial droop, sensation intact to soft touch of bilateral feet.  Skin: Skin is warm and dry. No erythema.  Pinpoint scab noted overlying a varicose vein along the dorsum of the left foot.  There is absolutely no bleeding at this time.  No pain on palpation of this area, no erythema or induration.  Psychiatric: He has a normal mood and affect. His behavior is normal.  Nursing note and vitals reviewed.    ED Treatments / Results  Labs (all labs ordered are listed, but only abnormal results are displayed) Labs Reviewed - No data to display  EKG None  Radiology No results found.  Procedures Procedures (including critical care  time)  Medications Ordered in ED Medications - No data to display   Initial Impression / Assessment and Plan / ED Course  I have reviewed the triage vital signs and the nursing notes.  Pertinent labs & imaging results that were available during my care of the patient were reviewed by me and considered in my medical decision making (see chart for details).     Patient presents for evaluation of resolved episode of bleeding just prior to arrival.  He is afebrile, vital signs are stable.  He has a very small scab overlying a varicose vein in his left foot.  He is neurovascularly intact and ambulatory without difficulty.  He does not think he lost a lot of blood and denies any lightheadedness or shortness of breath and he is not tachycardic and I highly doubt that he is anemic at this time.  He has been compliant with his home medications.  Discussed conservative management if the patient were to begin bleeding again by applying direct pressure for 10 minutes continuously and elevating the extremity.  Discussed indications for return to the ED. Pt verbalized understanding of and agreement with plan and is safe for discharge home at this time.  Patient has no complaints prior to discharge. Final Clinical Impressions(s) / ED Diagnoses   Final diagnoses:  Bleeding    ED Discharge Orders    None       Debroah Baller 04/05/18 Everrett Coombe, MD 04/05/18 2103

## 2018-04-05 NOTE — ED Triage Notes (Signed)
Patient to ED c/o bleeding to L foot - was in the bathtub and noticed blood in the water. Patient denies injury. Small hole to top of L foot noted, bleeding controlled. Patient is on Xarelto. Patient has no complaints.

## 2018-04-09 ENCOUNTER — Other Ambulatory Visit: Payer: Self-pay | Admitting: Internal Medicine

## 2018-04-09 DIAGNOSIS — N138 Other obstructive and reflux uropathy: Secondary | ICD-10-CM

## 2018-04-09 DIAGNOSIS — N401 Enlarged prostate with lower urinary tract symptoms: Principal | ICD-10-CM

## 2018-04-22 ENCOUNTER — Encounter: Payer: Self-pay | Admitting: Internal Medicine

## 2018-04-22 ENCOUNTER — Ambulatory Visit (INDEPENDENT_AMBULATORY_CARE_PROVIDER_SITE_OTHER): Payer: Medicare Other | Admitting: Internal Medicine

## 2018-04-22 ENCOUNTER — Other Ambulatory Visit (INDEPENDENT_AMBULATORY_CARE_PROVIDER_SITE_OTHER): Payer: Medicare Other

## 2018-04-22 VITALS — BP 118/60 | HR 67 | Temp 98.6°F | Ht 65.0 in | Wt 163.0 lb

## 2018-04-22 DIAGNOSIS — E559 Vitamin D deficiency, unspecified: Secondary | ICD-10-CM

## 2018-04-22 DIAGNOSIS — Z0000 Encounter for general adult medical examination without abnormal findings: Secondary | ICD-10-CM | POA: Diagnosis not present

## 2018-04-22 DIAGNOSIS — R972 Elevated prostate specific antigen [PSA]: Secondary | ICD-10-CM

## 2018-04-22 DIAGNOSIS — N138 Other obstructive and reflux uropathy: Secondary | ICD-10-CM

## 2018-04-22 DIAGNOSIS — E785 Hyperlipidemia, unspecified: Secondary | ICD-10-CM | POA: Diagnosis not present

## 2018-04-22 DIAGNOSIS — D508 Other iron deficiency anemias: Secondary | ICD-10-CM | POA: Diagnosis not present

## 2018-04-22 DIAGNOSIS — N401 Enlarged prostate with lower urinary tract symptoms: Secondary | ICD-10-CM

## 2018-04-22 DIAGNOSIS — I10 Essential (primary) hypertension: Secondary | ICD-10-CM

## 2018-04-22 DIAGNOSIS — I4891 Unspecified atrial fibrillation: Secondary | ICD-10-CM | POA: Diagnosis not present

## 2018-04-22 DIAGNOSIS — I83893 Varicose veins of bilateral lower extremities with other complications: Secondary | ICD-10-CM

## 2018-04-22 LAB — CBC WITH DIFFERENTIAL/PLATELET
BASOS PCT: 1.8 % (ref 0.0–3.0)
Basophils Absolute: 0.1 10*3/uL (ref 0.0–0.1)
Eosinophils Absolute: 0.1 10*3/uL (ref 0.0–0.7)
Eosinophils Relative: 1.6 % (ref 0.0–5.0)
HEMATOCRIT: 18.6 % — AB (ref 39.0–52.0)
Hemoglobin: 5.5 g/dL — CL (ref 13.0–17.0)
LYMPHS PCT: 18.8 % (ref 12.0–46.0)
Lymphs Abs: 0.9 10*3/uL (ref 0.7–4.0)
MCHC: 29.4 g/dL — AB (ref 30.0–36.0)
MCV: 62.8 fl — AB (ref 78.0–100.0)
MONOS PCT: 11 % (ref 3.0–12.0)
Monocytes Absolute: 0.5 10*3/uL (ref 0.1–1.0)
NEUTROS ABS: 3.2 10*3/uL (ref 1.4–7.7)
Neutrophils Relative %: 66.8 % (ref 43.0–77.0)
PLATELETS: 281 10*3/uL (ref 150.0–400.0)
RBC: 2.96 Mil/uL — ABNORMAL LOW (ref 4.22–5.81)
RDW: 19.7 % — AB (ref 11.5–15.5)
WBC: 4.8 10*3/uL (ref 4.0–10.5)

## 2018-04-22 LAB — VITAMIN D 25 HYDROXY (VIT D DEFICIENCY, FRACTURES): VITD: 27.55 ng/mL — AB (ref 30.00–100.00)

## 2018-04-22 LAB — LIPID PANEL
CHOLESTEROL: 115 mg/dL (ref 0–200)
HDL: 35.6 mg/dL — ABNORMAL LOW (ref >39.00)
LDL Cholesterol: 63 mg/dL (ref 0–99)
NonHDL: 78.93
TRIGLYCERIDES: 81 mg/dL (ref 0.0–149.0)
Total CHOL/HDL Ratio: 3
VLDL: 16.2 mg/dL (ref 0.0–40.0)

## 2018-04-22 LAB — IBC PANEL
Iron: 14 ug/dL — ABNORMAL LOW (ref 42–165)
Saturation Ratios: 2.4 % — ABNORMAL LOW (ref 20.0–50.0)
Transferrin: 420 mg/dL — ABNORMAL HIGH (ref 212.0–360.0)

## 2018-04-22 LAB — FERRITIN: Ferritin: 2.6 ng/mL — ABNORMAL LOW (ref 22.0–322.0)

## 2018-04-22 LAB — COMPREHENSIVE METABOLIC PANEL
ALK PHOS: 85 U/L (ref 39–117)
ALT: 15 U/L (ref 0–53)
AST: 15 U/L (ref 0–37)
Albumin: 4 g/dL (ref 3.5–5.2)
BILIRUBIN TOTAL: 1 mg/dL (ref 0.2–1.2)
BUN: 36 mg/dL — AB (ref 6–23)
CO2: 27 meq/L (ref 19–32)
Calcium: 9.5 mg/dL (ref 8.4–10.5)
Chloride: 104 mEq/L (ref 96–112)
Creatinine, Ser: 1.98 mg/dL — ABNORMAL HIGH (ref 0.40–1.50)
GFR: 43.11 mL/min — AB (ref >60.00)
GLUCOSE: 94 mg/dL (ref 70–99)
POTASSIUM: 3.7 meq/L (ref 3.5–5.1)
Sodium: 141 mEq/L (ref 135–145)
TOTAL PROTEIN: 7.4 g/dL (ref 6.0–8.3)

## 2018-04-22 LAB — TSH: TSH: 1.84 u[IU]/mL (ref 0.35–4.50)

## 2018-04-22 MED ORDER — CHOLECALCIFEROL 50 MCG (2000 UT) PO TABS
1.0000 | ORAL_TABLET | Freq: Every day | ORAL | 1 refills | Status: DC
Start: 2018-04-22 — End: 2019-08-19

## 2018-04-22 MED ORDER — FERROUS SULFATE 325 (65 FE) MG PO TABS: 325.0000 mg | ORAL_TABLET | Freq: Three times a day (TID) | ORAL | 5 refills | Status: DC

## 2018-04-22 NOTE — Patient Instructions (Signed)

## 2018-04-22 NOTE — Progress Notes (Signed)
Subjective:  Patient ID: Richard Blake, male    DOB: 07/07/1946  Age: 72 y.o. MRN: [id number]  CC: Annual Exam and Anemia   HPI TRISTAIN DAILY presents for a CPX.  He was seen in the ED about 2 months ago for a bleeding varicose vein on his left lower extremity.  He continues to be concerned about the varicose veins but there has been no recurrent bleeding.  He wants to see a vascular surgeon to see if something can be done about this.  He also tells me for the last few months he has been feeling poorly with worsening edema, fatigue, DOE, nonproductive cough, and shortness of breath.  For some reason he is not taking his iron supplement.  He is not aware of any sources of blood loss.  Past Medical History:  Diagnosis Date  . A-fib ([place]) 11/2016   Tx with po meds at this time  . Anemia    Microcytic with Fe Sat 4% 10/31/10  . Atypical chest pain   . Cataract    removed bilaterally  . Chronic rhinitis   . Cough   . Diverticulosis   . Dyspnea    " WITH MY AFIB "  . GERD (gastroesophageal reflux disease)   . Hiatal hernia   . Hiatal hernia   . HLD (hyperlipidemia)   . HTN (hypertension)   . Interstitial lung disease ([place])   . Kidney stones   . Osteoarthritis   . Sarcoidosis 8/99   Transbronchial biopsy, taking daily prednisone since  November 08 on and off since 1999 w/cough every time he relapses off it for more than several weeks  . 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 CARDIOVERSION N/A 12/21/2016   Procedure: CARDIOVERSION;  Surgeon: Evans Lance, MD;  Location: [place];  Service: Cardiovascular;  Laterality: N/A;  . CARDIOVERSION N/A 01/31/2017   Procedure: CARDIOVERSION;  Surgeon: Pixie Casino, MD;  Location: [facility] ENDOSCOPY;  Service: Cardiovascular;  Laterality: N/A;  . CATARACT EXTRACTION Left 10/14/2013  . CATARACT EXTRACTION Right 11/24/2013  . CERVICAL  FUSION     pt states he did not have a cervical fusion, just a biopsy   . COLONOSCOPY  01/12/2006   tics only   . INGUINAL HERNIA REPAIR     right  . LIPOMA EXCISION Left 09/16/2004   forearm  . spinal cord biopsyOther]  1996   sarcoid    reports that he has never smoked. He has never used smokeless tobacco. He reports that he does not drink alcohol or use drugs. family history includes Arthritis in his paternal grandmother; Asthma in his mother; Colon polyps in his sister; Diabetes in his mother; Heart disease in his paternal grandmother and paternal uncle; Hypertension in his father; Kidney disease in his father and sister; Multiple myeloma in his sister; Prostate cancer in his father. Allergies  Allergen Reactions  . Amlodipine Other (See Comments)    Edema   . Welchol [Colesevelam Hcl] Other (See Comments)    Stomach cramps  . Other Swelling    Hair dye - facial swelling   . Prolia [Denosumab] Other (See Comments)    Pain at injection sight    Outpatient Medications Prior to Visit  Medication Sig Dispense Refill  . acetaminophen (TYLENOL) 500 MG tablet Take 500-1,000 mg by mouth every 6 (six) hours as needed (for pain from muscle spasms).    Marland Kitchen  Dietary Management Product (FOSTEUM PLUS) CAPS Take 1 capsule by mouth 2 (two) times daily. 60 capsule 11  . ergocalciferol (VITAMIN D2) 50000 units capsule Take 50,000 Units by mouth once a week. On Mondays and Thursday    . famotidine (PEPCID) 20 MG tablet Take 20 mg by mouth at bedtime as needed for heartburn or indigestion.     . finasteride (PROSCAR) 5 MG tablet TAKE 1 TABLET BY MOUTH ONCE DAILY 90 tablet 0  . losartan (COZAAR) 100 MG tablet TAKE 1 TABLET BY MOUTH ONCE DAILY 90 tablet 0  . metoprolol tartrate (LOPRESSOR) 25 MG tablet Take 1 tablet (25 mg total) by mouth 2 (two) times daily. Please make yearly appt with Dr. Lovena Le for April. 1st attempt 180 tablet 0  . rosuvastatin (CRESTOR) 10 MG tablet take 1 tablet by mouth once  daily 90 tablet 1  . torsemide (DEMADEX) 20 MG tablet Take 1 tablet (20 mg total) by mouth 2 (two) times daily. 180 tablet 3  . vitamin B-12 (CYANOCOBALAMIN) 250 MCG tablet Take 250 mcg by mouth daily.      Alveda Reasons 15 MG TABS tablet TAKE 1 TABLET BY MOUTH ONCE DAILY WITH SUPPER 90 tablet 0   No facility-administered medications prior to visit.     ROS Review of Systems  Constitutional: Positive for fatigue. Negative for chills, diaphoresis and unexpected weight change.  HENT: Negative.   Eyes: Negative for visual disturbance.  Respiratory: Positive for cough and shortness of breath. Negative for chest tightness, wheezing and stridor.   Cardiovascular: Positive for leg swelling. Negative for chest pain and palpitations.  Gastrointestinal: Negative.  Negative for abdominal pain, constipation, nausea and vomiting.  Endocrine: Negative.   Genitourinary: Negative.  Negative for difficulty urinating, dysuria, hematuria, penile pain, penile swelling, scrotal swelling, testicular pain and urgency.  Musculoskeletal: Negative.   Skin: Positive for pallor. Negative for color change and rash.  Neurological: Negative.  Negative for dizziness, weakness, light-headedness and numbness.  Hematological: Negative for adenopathy. Does not bruise/bleed easily.  Psychiatric/Behavioral: Negative.     Objective:  BP 118/60 (BP Location: Left Arm, Patient Position: Sitting, Cuff Size: Normal)   Pulse 67   Temp 98.6 F (37 C) (Oral)   Ht _0  (1.651 m)   Wt 163 lb (73.9 kg)   SpO2 99%   BMI 27.12 kg/m   BP Readings from Last 3 Encounters:  04/22/18 118/60  04/05/18 124/70  03/08/18 118/68    Wt Readings from Last 3 Encounters:  04/22/18 163 lb (73.9 kg)  03/08/18 166 lb (75.3 kg)  09/25/17 172 lb (78 kg)    Physical Exam  Constitutional: He is oriented to person, place, and time.  HENT:  Mouth/Throat: Oropharynx is clear and moist. Mucous membranes are pale, not dry and not cyanotic. No  oropharyngeal exudate, posterior oropharyngeal edema, posterior oropharyngeal erythema or tonsillar abscesses.  Eyes: Conjunctivae are normal. No scleral icterus.  Neck: Normal range of motion. Neck supple. No JVD present. No thyromegaly present.  Cardiovascular: Normal rate. An irregularly irregular rhythm present.  Occasional extrasystoles are present. Exam reveals no gallop.  No murmur heard. EKG ---  Atrial fibrillation  - occasional ectopic ventricular beat    -Prominent R(V1) -nonspecific.   ABNORMAL RHYTHM  Pulmonary/Chest: Effort normal and breath sounds normal. No respiratory distress. He has no wheezes. He has no rales.  Abdominal: Soft. Normal appearance and bowel sounds are normal. He exhibits no mass. There is no hepatosplenomegaly. There is no tenderness. Hernia  confirmed negative in the right inguinal area and confirmed negative in the left inguinal area.  Genitourinary: Rectum normal, testes normal and penis normal. Rectal exam shows no external hemorrhoid, no internal hemorrhoid, no fissure, no mass, no tenderness, anal tone normal and guaiac negative stool. Prostate is enlarged (2+ smooth symm BPH). Prostate is not tender. Right testis shows no mass, no swelling and no tenderness. Left testis shows no mass, no swelling and no tenderness. No penile erythema or penile tenderness. No discharge found.  Genitourinary Comments: The left hemiscrotum is empty  Musculoskeletal: He exhibits edema and deformity. He exhibits no tenderness.  There is 2+ pitting edema over both lower extremities.  There are diffuse, scattered, superficial varicosities over both lower extremities with no ulcers/bleeding/or complications.  Lymphadenopathy:    He has no cervical adenopathy. No inguinal adenopathy noted on the right or left side.  Neurological: He is alert and oriented to person, place, and time.  Skin: Skin is warm and dry. No rash noted. There is pallor.    Lab Results  Component Value Date    WBC 4.8 04/22/2018   HGB 5.5 Repeated and verified X2. (LL) 04/22/2018   HCT 18.6 (LL) 04/22/2018   PLT 281.0 04/22/2018   GLUCOSE 94 04/22/2018   CHOL 115 04/22/2018   TRIG 81.0 04/22/2018   HDL 35.60 (L) 04/22/2018   LDLDIRECT 186.5 10/23/2012   LDLCALC 63 04/22/2018   ALT 15 04/22/2018   AST 15 04/22/2018   NA 141 04/22/2018   K 3.7 04/22/2018   CL 104 04/22/2018   CREATININE 1.98 (H) 04/22/2018   BUN 36 (H) 04/22/2018   CO2 27 04/22/2018   TSH 1.84 04/22/2018   PSA 1.2 04/23/2018   INR 1.1 12/14/2016    No results found.  Assessment & Plan:   Asser was seen today for annual exam and anemia.  Diagnoses and all orders for this visit:  Essential hypertension- His blood pressure is adequately well controlled.  His renal function is stable.  Electrolytes are normal. -     Comprehensive metabolic panel; Future  Other iron deficiency anemia- His hemoglobin and hematocrit are down to 5.5 and 18.6 respectively.  I think this is driving his symptoms.  His iron level and ferritin are also low.  I have asked him to restart iron replacement therapy.  I have also asked him to see GI to see if he needs to be investigated for GI sources of blood loss. -     CBC with Differential/Platelet; Future -     IBC panel; Future -     Ferritin; Future -     ferrous sulfate 325 (65 FE) MG tablet; Take 1 tablet (325 mg total) by mouth 3 (three) times daily with meals. -     Ambulatory referral to Gastroenterology  Hyperlipidemia with target LDL less than 130- He has achieved his LDL goal and is doing well on the statin. -     Lipid panel; Future -     TSH; Future  Vitamin D deficiency- I have asked him to restart vitamin D replacement therapy. -     VITAMIN D 25 Hydroxy (Vit-D Deficiency, Fractures); Future -     Cholecalciferol 2000 units TABS; Take 1 tablet (2,000 Units total) by mouth daily.  PSA elevation- His PSA is down to 1.2.  This is reassuring that he does not have prostate  cancer.  He has no symptoms at this time the need to be treated. -  PSA, total and free; Future  BPH with obstruction/lower urinary tract symptoms-as above. -     PSA, total and free; Future  Atrial fibrillation, unspecified type ([place])- he has adequate rate control.  This may also be contributing to his symptoms I have asked him to follow-up with cardiology at his earliest convenience. -     EKG 12-Lead  Routine general medical examination at a health care facility  Varicose veins of both legs with edema -     Ambulatory referral to Vascular Surgery   I am having Michail Sermon. Harpole start on ferrous sulfate and Cholecalciferol. I am also having him maintain his vitamin B-12, famotidine, rosuvastatin, acetaminophen, ergocalciferol, FOSTEUM PLUS, torsemide, metoprolol tartrate, XARELTO, losartan, and finasteride.  Meds ordered this encounter  Medications  . ferrous sulfate 325 (65 FE) MG tablet    Sig: Take 1 tablet (325 mg total) by mouth 3 (three) times daily with meals.    Dispense:  90 tablet    Refill:  5  . Cholecalciferol 2000 units TABS    Sig: Take 1 tablet (2,000 Units total) by mouth daily.    Dispense:  90 tablet    Refill:  1   See AVS for instructions about healthy living and anticipatory guidance.  Follow-up: Return in about 3 months (around 07/23/2018).  Scarlette Calico, MD

## 2018-04-23 ENCOUNTER — Encounter: Payer: Self-pay | Admitting: Internal Medicine

## 2018-04-23 LAB — PSA, TOTAL AND FREE
PSA, % Free: 33 % (calc) (ref >25)
PSA, Free: 0.4 ng/mL
PSA, Total: 1.2 ng/mL (ref <=4.0)

## 2018-04-23 LAB — PSA: PSA: 1.2

## 2018-04-23 NOTE — Assessment & Plan Note (Signed)

## 2018-04-25 ENCOUNTER — Other Ambulatory Visit: Payer: Self-pay

## 2018-04-25 DIAGNOSIS — I83893 Varicose veins of bilateral lower extremities with other complications: Secondary | ICD-10-CM

## 2018-05-01 DIAGNOSIS — M5136 Other intervertebral disc degeneration, lumbar region: Secondary | ICD-10-CM | POA: Diagnosis not present

## 2018-05-01 DIAGNOSIS — M9903 Segmental and somatic dysfunction of lumbar region: Secondary | ICD-10-CM | POA: Diagnosis not present

## 2018-05-01 DIAGNOSIS — M9905 Segmental and somatic dysfunction of pelvic region: Secondary | ICD-10-CM | POA: Diagnosis not present

## 2018-05-01 DIAGNOSIS — M9904 Segmental and somatic dysfunction of sacral region: Secondary | ICD-10-CM | POA: Diagnosis not present

## 2018-05-07 ENCOUNTER — Other Ambulatory Visit: Payer: Self-pay | Admitting: Physician Assistant

## 2018-05-07 ENCOUNTER — Other Ambulatory Visit: Payer: Self-pay | Admitting: Internal Medicine

## 2018-05-07 DIAGNOSIS — I4819 Other persistent atrial fibrillation: Secondary | ICD-10-CM

## 2018-05-07 DIAGNOSIS — I10 Essential (primary) hypertension: Secondary | ICD-10-CM

## 2018-05-29 ENCOUNTER — Telehealth: Payer: Self-pay

## 2018-05-29 ENCOUNTER — Ambulatory Visit (INDEPENDENT_AMBULATORY_CARE_PROVIDER_SITE_OTHER): Payer: Medicare Other | Admitting: Physician Assistant

## 2018-05-29 ENCOUNTER — Telehealth: Payer: Self-pay | Admitting: Emergency Medicine

## 2018-05-29 ENCOUNTER — Encounter: Payer: Self-pay | Admitting: Physician Assistant

## 2018-05-29 ENCOUNTER — Other Ambulatory Visit (INDEPENDENT_AMBULATORY_CARE_PROVIDER_SITE_OTHER): Payer: Medicare Other

## 2018-05-29 VITALS — BP 100/60 | HR 64 | Ht 65.0 in | Wt 161.0 lb

## 2018-05-29 DIAGNOSIS — K449 Diaphragmatic hernia without obstruction or gangrene: Secondary | ICD-10-CM | POA: Diagnosis not present

## 2018-05-29 DIAGNOSIS — Z7901 Long term (current) use of anticoagulants: Secondary | ICD-10-CM | POA: Diagnosis not present

## 2018-05-29 DIAGNOSIS — E559 Vitamin D deficiency, unspecified: Secondary | ICD-10-CM | POA: Diagnosis not present

## 2018-05-29 DIAGNOSIS — D508 Other iron deficiency anemias: Secondary | ICD-10-CM

## 2018-05-29 LAB — CBC WITH DIFFERENTIAL/PLATELET
BASOS ABS: 0 10*3/uL (ref 0.0–0.1)
Basophils Relative: 1.3 % (ref 0.0–3.0)
Eosinophils Absolute: 0.1 10*3/uL (ref 0.0–0.7)
Eosinophils Relative: 3.2 % (ref 0.0–5.0)
HCT: 22.6 % — CL (ref 39.0–52.0)
Hemoglobin: 6.5 g/dL — CL (ref 13.0–17.0)
LYMPHS ABS: 0.8 10*3/uL (ref 0.7–4.0)
Lymphocytes Relative: 21.7 % (ref 12.0–46.0)
MCHC: 28.5 g/dL — ABNORMAL LOW (ref 30.0–36.0)
MCV: 65.1 fl — ABNORMAL LOW (ref 78.0–100.0)
MONO ABS: 0.4 10*3/uL (ref 0.1–1.0)
MONOS PCT: 12 % (ref 3.0–12.0)
NEUTROS PCT: 61.8 % (ref 43.0–77.0)
Neutro Abs: 2.2 10*3/uL (ref 1.4–7.7)
Platelets: 306 10*3/uL (ref 150.0–400.0)
RBC: 3.48 Mil/uL — AB (ref 4.22–5.81)
RDW: 24.2 % — ABNORMAL HIGH (ref 11.5–15.5)
WBC: 3.5 10*3/uL — ABNORMAL LOW (ref 4.0–10.5)

## 2018-05-29 LAB — FERRITIN: FERRITIN: 2.9 ng/mL — AB (ref 22.0–322.0)

## 2018-05-29 LAB — IBC PANEL
IRON: 36 ug/dL — AB (ref 42–165)
SATURATION RATIOS: 5.9 % — AB (ref 20.0–50.0)
Transferrin: 437 mg/dL — ABNORMAL HIGH (ref 212.0–360.0)

## 2018-05-29 NOTE — Telephone Encounter (Signed)
 [facility] Pre-operative Risk Assessment     Request for surgical clearance:     Endoscopy Procedure  What type of surgery is being performed?     endoscopy  When is this surgery scheduled?    06-14-18   What type of clearance is required ?   Pharmacy  Are there any medications that need to be held prior to surgery and how long? Xarelto 2 days  Practice name and name of physician performing surgery?      [facility]  What is your office phone and fax number?      Phone- [phone]  [phone]  Anesthesia type (None, local, MAC, general) ?       MAC

## 2018-05-29 NOTE — Patient Instructions (Signed)
Your provider has requested that you go to the basement level for lab work before leaving today. Press "B" on the elevator. The lab is located at the first door on the left as you exit the elevator.  You have been scheduled for an endoscopy. Please follow written instructions given to you at your visit today. If you use inhalers (even only as needed), please bring them with you on the day of your procedure. Your physician has requested that you go to www.[email or web address] and enter the access code given to you at your visit today. This web site gives a general overview about your procedure. However, you should still follow specific instructions given to you by our office regarding your preparation for the procedure.

## 2018-05-29 NOTE — Progress Notes (Signed)
Chief Complaint: Iron deficiency anemia  HPI:    Richard Blake is a 72 year old male with a past medical history as listed below including microcytic anemia, diverticulosis, GERD, hyperlipidemia, hypertension, interstitial lung disease, sarcoidosis and A. fib on Xarelto (11/28/2016 LVEF 50-55%) who was referred to me by Janith Lima, MD for a complaint of iron deficiency anemia.     07/10/2016 colonoscopy Dr. Ardis Hughs with diverticulosis in the entire examined colon and otherwise normal.  Repeat recommended in 10 years.    12/05/2016 office visit with me to discuss pressure in his abdomen/chest and into his diaphragm.  Try to arrange for EGD at that time.  This was never done.    04/22/2018 saw PCP and described worsening fatigue, that time and not been taking his iron was recommended he restart this 325 mg tablets 1 tablet by mouth 3 times daily and he was referred to [place].  CBC with a hemoglobin of 5.5.  Previously 10 7 months prior to that.  Iron panel shows an iron low at 14, transferrin 4, 20% saturation 2.4.  Ferritin 2.6.    Today, explains that he has always been slightly anemic, but has never seen a 5 before.  Tells me that prior to restarting iron over the past month he was feeling dyspnea on exertion and generalized fatigue.  He has been taking his iron 3 times a day for the past month and now feels better.  Still has a low level of fatigue and exhaustion, but is no longer short of breath walking short distances.    Does describe an incident of a bleeding varicose vein which was "squirting out blood" for about 15 minutes after he hit this while getting into the shower.  Believes he lost about a pint of blood at that time.  Denies seeing any other signs of blood loss including melena, hematochezia, hematuria or having any nosebleeds.    Does tell me he feels as though his hiatal hernia has been "more under control" lately.  Although he does admit to a decrease in appetite.    Denies fever, chills,  weight loss, anorexia, nausea, vomiting, heartburn, reflux or symptoms that awaken him at night.  Past Medical History:  Diagnosis Date  . A-fib ([place]) 11/2016   Tx with po meds at this time  . Anemia    Microcytic with Fe Sat 4% 10/31/10  . Atypical chest pain   . Cataract    removed bilaterally  . Chronic rhinitis   . Cough   . Diverticulosis   . Dyspnea    " WITH MY AFIB "  . GERD (gastroesophageal reflux disease)   . Hiatal hernia   . Hiatal hernia   . HLD (hyperlipidemia)   . HTN (hypertension)   . Interstitial lung disease ([place])   . Kidney stones   . Osteoarthritis   . Sarcoidosis 8/99   Transbronchial biopsy, taking daily prednisone since  November 08 on and off since 1999 w/cough every time he relapses off it for more than several weeks  . 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 CARDIOVERSION N/A 12/21/2016   Procedure: CARDIOVERSION;  Surgeon: Evans Lance, MD;  Location: [place];  Service: Cardiovascular;  Laterality: N/A;  . CARDIOVERSION N/A 01/31/2017   Procedure: CARDIOVERSION;  Surgeon: Pixie Casino, MD;  Location: [place];  Service: Cardiovascular;  Laterality: N/A;  . CATARACT EXTRACTION  Left 10/14/2013  . CATARACT EXTRACTION Right 11/24/2013  . CERVICAL FUSION     pt states he did not have a cervical fusion, just a biopsy   . COLONOSCOPY  01/12/2006   tics only   . INGUINAL HERNIA REPAIR     right  . LIPOMA EXCISION Left 09/16/2004   forearm  . spinal cord biopsyOther]  1996   sarcoid    Current Outpatient Medications  Medication Sig Dispense Refill  . acetaminophen (TYLENOL) 500 MG tablet Take 500-1,000 mg by mouth every 6 (six) hours as needed (for pain from muscle spasms).    . Cholecalciferol 2000 units TABS Take 1 tablet (2,000 Units total) by mouth daily. 90 tablet 1  . Dietary Management Product (FOSTEUM PLUS) CAPS Take 1 capsule by  mouth 2 (two) times daily. 60 capsule 11  . ergocalciferol (VITAMIN D2) 50000 units capsule Take 50,000 Units by mouth once a week. On Mondays and Thursday    . famotidine (PEPCID) 20 MG tablet Take 20 mg by mouth at bedtime as needed for heartburn or indigestion.     . ferrous sulfate 325 (65 FE) MG tablet Take 1 tablet (325 mg total) by mouth 3 (three) times daily with meals. 90 tablet 5  . finasteride (PROSCAR) 5 MG tablet TAKE 1 TABLET BY MOUTH ONCE DAILY 90 tablet 0  . losartan (COZAAR) 100 MG tablet TAKE 1 TABLET BY MOUTH ONCE DAILY 90 tablet 2  . metoprolol tartrate (LOPRESSOR) 25 MG tablet Take 1 tablet (25 mg total) by mouth 2 (two) times daily. 180 tablet 2  . rosuvastatin (CRESTOR) 10 MG tablet take 1 tablet by mouth once daily 90 tablet 1  . torsemide (DEMADEX) 20 MG tablet Take 1 tablet (20 mg total) by mouth 2 (two) times daily. 180 tablet 3  . vitamin B-12 (CYANOCOBALAMIN) 250 MCG tablet Take 250 mcg by mouth daily.      Alveda Reasons 15 MG TABS tablet TAKE 1 TABLET BY MOUTH ONCE DAILY WITH SUPPER 90 tablet 0   No current facility-administered medications for this visit.     Allergies as of 05/29/2018 - Review Complete 04/22/2018  Allergen Reaction Noted  . Amlodipine Other (See Comments) 11/23/2016  . Welchol [colesevelam hcl] Other (See Comments) 08/28/2013  . Other Swelling 06/28/2016  . Prolia [denosumab] Other (See Comments) 01/22/2013    Family History  Problem Relation Age of Onset  . Asthma Mother   . Diabetes Mother   . Arthritis Paternal Grandmother   . Heart disease Paternal Grandmother   . Hypertension Father        Fathers family most  . Prostate cancer Father   . Kidney disease Father   . Colon polyps Sister   . Multiple myeloma Sister   . Heart disease Paternal Uncle   . Kidney disease Sister   . Sarcoidosis Neg Hx   . Other Neg Hx        osteoporosis  . Colon cancer Neg Hx   . Esophageal cancer Neg Hx   . Rectal cancer Neg Hx   . Stomach cancer  Neg Hx     Social History   Socioeconomic History  . Marital status: Single    Spouse name: Not on file  . Number of children: 0  . Years of education: Not on file  . Highest education level: Not on file  Occupational History  . Occupation: Retired    Fish farm manager: [employer]  . Financial resource strain: Not  on file  . Food insecurity:    Worry: Not on file    Inability: Not on file  . Transportation needs:    Medical: Not on file    Non-medical: Not on file  Tobacco Use  . Smoking status: Never Smoker  . Smokeless tobacco: Never Used  Substance and Sexual Activity  . Alcohol use: No    Alcohol/week: 0.0 oz  . Drug use: No  . Sexual activity: Not Currently  Lifestyle  . Physical activity:    Days per week: Not on file    Minutes per session: Not on file  . Stress: Not on file  Relationships  . Social connections:    Talks on phone: Not on file    Gets together: Not on file    Attends religious service: Not on file    Active member of club or organization: Not on file    Attends meetings of clubs or organizations: Not on file    Relationship status: Not on file  . Intimate partner violence:    Fear of current or ex partner: Not on file    Emotionally abused: Not on file    Physically abused: Not on file    Forced sexual activity: Not on file  Other Topics Concern  . Not on file  Social History Narrative   Regular Exercise -  NO    Review of Systems:    Constitutional: No weight loss, fever or chills Cardiovascular: No chest pain Respiratory: +DOE Gastrointestinal: See HPI and otherwise negative   Physical Exam:  Vital signs: BP 100/60   Pulse 64   Ht _0  (1.651 m)   Wt 161 lb (73 kg)   BMI 26.79 kg/m   Constitutional:   Pleasant AA male appears to be in NAD, Well developed, Well nourished, alert and cooperative Head:  Normocephalic and atraumatic. Eyes:   PEERL, EOMI. No icterus. Conjunctiva pink. Ears:  Normal auditory  acuity. Neck:  Supple Throat: Oral cavity and pharynx without inflammation, swelling or lesion.  Respiratory: Respirations even and unlabored. Lungs clear to auscultation bilaterally.   No wheezes, crackles, or rhonchi.  Cardiovascular: Normal S1, S2. No MRG. Regular rate and rhythm. No peripheral edema, cyanosis or pallor.  Gastrointestinal:  Soft, nondistended, nontender. No rebound or guarding. Normal bowel sounds. No appreciable masses or hepatomegaly. Rectal:  Not performed.  Msk:  Symmetrical without gross deformities. Without edema, no deformity or joint abnormality.  Neurologic:  Alert and  oriented x4;  grossly normal neurologically.  Skin:   Dry and intact without significant lesions or rashes. Psychiatric: Demonstrates good judgement and reason without abnormal affect or behaviors.  RELEVANT LABS AND IMAGING: CBC    Component Value Date/Time   WBC 4.8 04/22/2018 1419   RBC 2.96 (L) 04/22/2018 1419   HGB 5.5 Repeated and verified X2. (LL) 04/22/2018 1419   HGB 10.1 (L) 12/14/2016 1000   HCT 18.6 (LL) 04/22/2018 1419   HCT 34.0 (L) 12/14/2016 1000   PLT 281.0 04/22/2018 1419   PLT 298 12/14/2016 1000   MCV 62.8 (L) 04/22/2018 1419   MCV 83 12/14/2016 1000   MCH 24.8 (L) 12/14/2016 1000   MCHC 29.4 (L) 04/22/2018 1419   RDW 19.7 (H) 04/22/2018 1419   RDW 15.5 (H) 12/14/2016 1000   LYMPHSABS 0.9 04/22/2018 1419   LYMPHSABS 1.1 12/14/2016 1000   MONOABS 0.5 04/22/2018 1419   EOSABS 0.1 04/22/2018 1419   EOSABS 0.2 12/14/2016 1000   BASOSABS 0.1 04/22/2018  1419   BASOSABS 0.0 12/14/2016 1000    CMP     Component Value Date/Time   NA 141 04/22/2018 1419   NA 143 02/08/2017 0910   K 3.7 04/22/2018 1419   CL 104 04/22/2018 1419   CO2 27 04/22/2018 1419   GLUCOSE 94 04/22/2018 1419   BUN 36 (H) 04/22/2018 1419   BUN 29 (H) 02/08/2017 0910   CREATININE 1.98 (H) 04/22/2018 1419   CALCIUM 9.5 04/22/2018 1419   PROT 7.4 04/22/2018 1419   ALBUMIN 4.0 04/22/2018 1419    AST 15 04/22/2018 1419   ALT 15 04/22/2018 1419   ALKPHOS 85 04/22/2018 1419   BILITOT 1.0 04/22/2018 1419   GFRNONAA 30 (L) 02/08/2017 0910   GFRAA 35 (L) 02/08/2017 0910   Iron/TIBC/Ferritin/ %Sat    Component Value Date/Time   IRON 14 (L) 04/22/2018 1419   FERRITIN 2.6 (L) 04/22/2018 1419   IRONPCTSAT 2.4 (L) 04/22/2018 1419   Assessment: 1.  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  Chronic anticoagulation: With Xarelto for A. fib 3.  Large hiatal hernia  Plan: 1.  Scheduled patient for an EGD.  Dr. Ardis Hughs did not have availability until September.  Patient requested to be placed with another provider.  Scheduled with Dr. Lyndel Safe in 2 weeks.  Did discuss risk, benefits, limitations and alternatives and patient agrees to proceed.  Patient will resume care with his primary GI physician Dr. Ardis Hughs after time of procedure. 2.  Ordered repeat CBC, iron studies and vitamin D today.  If patient remains with a hemoglobin below 7 and low iron will order for Feraheme infusion and blood transfusion prior to time of EGD.  Did discuss this with the patient today. 3.  Recommend patient continue iron 3 times a day as prescribed by his PCP. 4.  Advised patient to hold his Xarelto for 2 days prior to time procedure.  We will discuss with his physician to ensure that holding his Xarelto is acceptable for him. 5.  Patient to follow in clinic per recommendations after time of EGD.  Ellouise Newer, PA-C [facility] 05/29/2018, 1:46 PM  Cc: Janith Lima, MD

## 2018-05-29 NOTE — Telephone Encounter (Signed)
-----   Message from Levin Erp, [place] sent at 05/29/2018  3:34 PM EDT ----- Regarding: IDA Hgb 6.5 per report from lab. Please send patient for blood transfusion-2 u prbcs. Also Faraheme infusion x2 a week apart. Recheck iron/hgb in 1.5 weeks-before EGD report  Thanks-JLL

## 2018-05-30 ENCOUNTER — Other Ambulatory Visit: Payer: Self-pay

## 2018-05-30 DIAGNOSIS — D508 Other iron deficiency anemias: Secondary | ICD-10-CM

## 2018-05-30 NOTE — Progress Notes (Signed)
I agree with the above note, plan 

## 2018-05-30 NOTE — Telephone Encounter (Signed)
The pt has been scheduled for blood and iron transfusion on 06/05/18 at 8 am at patient care center The pt has been advised of the information and verbalized understanding.

## 2018-05-30 NOTE — Telephone Encounter (Signed)
To pharm 

## 2018-05-30 NOTE — Telephone Encounter (Signed)
Patient with diagnosis of Afib on Xarelto for anticoagulation.    Procedure: endoscopy Date of procedure: 06/14/18  CHADS2-VASc score of  2 (CHF, HTN, AGE, DM2, stroke/tia x 2, CAD, AGE, male)  CrCl 39ml/min  Per office protocol, patient can hold Xarelto for 2 days prior to procedure.

## 2018-05-31 NOTE — Telephone Encounter (Signed)
Pt informed to hold Xarelto 2 days prior to procedure and to resume per discharge instructions. He verbalized understanding.

## 2018-06-01 LAB — VITAMIN D 1,25 DIHYDROXY
VITAMIN D 1, 25 (OH) TOTAL: 43 pg/mL (ref 18–72)
VITAMIN D2 1, 25 (OH): 21 pg/mL
VITAMIN D3 1, 25 (OH): 22 pg/mL

## 2018-06-02 NOTE — Progress Notes (Signed)
Thanks for letting me know Merrie Roof

## 2018-06-05 ENCOUNTER — Encounter: Payer: Self-pay | Admitting: Physician Assistant

## 2018-06-05 ENCOUNTER — Ambulatory Visit (HOSPITAL_COMMUNITY)
Admission: RE | Admit: 2018-06-05 | Discharge: 2018-06-05 | Disposition: A | Discharge status: Home / Self Care | Payer: Medicare Other | Source: Ambulatory Visit | Attending: Internal Medicine | Admitting: Internal Medicine

## 2018-06-05 DIAGNOSIS — Z9289 Personal history of other medical treatment: Secondary | ICD-10-CM

## 2018-06-05 DIAGNOSIS — D508 Other iron deficiency anemias: Secondary | ICD-10-CM | POA: Insufficient documentation

## 2018-06-05 HISTORY — DX: Personal history of other medical treatment: Z92.89

## 2018-06-05 LAB — ABO/RH: ABO/RH(D): O POS

## 2018-06-05 LAB — PREPARE RBC (CROSSMATCH)

## 2018-06-05 MED ORDER — SODIUM CHLORIDE 0.9% IV SOLUTION
Freq: Once | INTRAVENOUS | Status: AC
  Administered 2018-06-05: 10 mL/h via INTRAVENOUS

## 2018-06-05 MED ORDER — SODIUM CHLORIDE 0.9 % IV SOLN
510.0000 mg | INTRAVENOUS | Status: DC
  Administered 2018-06-05: 510 mg via INTRAVENOUS
  Filled 2018-06-05: qty 17

## 2018-06-05 NOTE — Progress Notes (Signed)
Spoke with Shirlean Mylar, RN at [facility]; per previous note and confirmed, pt to receive infusion of IV feraheme and 2 units PRBCs today; pt to come back in one week for 2nd infusion of IV feraheme; will continue to monitor

## 2018-06-05 NOTE — Progress Notes (Signed)
Patient received Feraheme and 2 units of PRBC after a type and screen of the blood was done via PIV. Patient was observed for at at least 30 minutes post Feraheme infusion.Tolerated well, vitals stable, discharge instructions given, verbalized understanding. Patient alert, oriented and ambulatory at the time of discharge.

## 2018-06-05 NOTE — Discharge Instructions (Signed)
Blood Transfusion, Care After This sheet gives you information about how to care for yourself after your procedure. Your doctor may also give you more specific instructions. If you have problems or questions, contact your doctor. Follow these instructions at home:  Take over-the-counter and prescription medicines only as told by your doctor.  Go back to your normal activities as told by your doctor.  Follow instructions from your doctor about how to take care of the area where an IV tube was put into your vein (insertion site). Make sure you: ? Wash your hands with soap and water before you change your bandage (dressing). If there is no soap and water, use hand sanitizer. ? Change your bandage as told by your doctor.  Check your IV insertion site every day for signs of infection. Check for: ? More redness, swelling, or pain. ? More fluid or blood. ? Warmth. ? Pus or a bad smell. Contact a doctor if:  You have more redness, swelling, or pain around the IV insertion site..  You have more fluid or blood coming from the IV insertion site.  Your IV insertion site feels warm to the touch.  You have pus or a bad smell coming from the IV insertion site.  Your pee (urine) turns pink, red, or brown.  You feel weak after doing your normal activities. Get help right away if:  You have signs of a serious allergic or body defense (immune) system reaction, including: ? Itchiness. ? Hives. ? Trouble breathing. ? Anxiety. ? Pain in your chest or lower back. ? Fever, flushing, and chills. ? Fast pulse. ? Rash. ? Watery poop (diarrhea). ? Throwing up (vomiting). ? Dark pee. ? Serious headache. ? Dizziness. ? Stiff neck. ? Yellow color in your face or the white parts of your eyes (jaundice). Summary  After a blood transfusion, return to your normal activities as told by your doctor.  Every day, check for signs of infection where the IV tube was put into your vein.  Some signs of  infection are warm skin, more redness and pain, more fluid or blood, and pus or a bad smell where the needle went in.  Contact your doctor if you feel weak or have any unusual symptoms. This information is not intended to replace advice given to you by your health care provider. Make sure you discuss any questions you have with your health care provider. Document Released: 11/20/2014 Document Revised: 06/23/2016 Document Reviewed: 06/23/2016 Elsevier Interactive Patient Education  2017 Elsevier Inc.   Ferumoxytol injection What is this medicine? FERUMOXYTOL is an iron complex. Iron is used to make healthy red blood cells, which carry oxygen and nutrients throughout the body. This medicine is used to treat iron deficiency anemia in people with chronic kidney disease. This medicine may be used for other purposes; ask your health care provider or pharmacist if you have questions. COMMON BRAND NAME(S): Feraheme What should I tell my health care provider before I take this medicine? They need to know if you have any of these conditions: -anemia not caused by low iron levels -high levels of iron in the blood -magnetic resonance imaging (MRI) test scheduled -an unusual or allergic reaction to iron, other medicines, foods, dyes, or preservatives -pregnant or trying to get pregnant -breast-feeding How should I use this medicine? This medicine is for injection into a vein. It is given by a health care professional in a hospital or clinic setting. Talk to your pediatrician regarding the use of this   medicine in children. Special care may be needed. Overdosage: If you think you have taken too much of this medicine contact a poison control center or emergency room at once. NOTE: This medicine is only for you. Do not share this medicine with others. What if I miss a dose? It is important not to miss your dose. Call your doctor or health care professional if you are unable to keep an appointment. What  may interact with this medicine? This medicine may interact with the following medications: -other iron products This list may not describe all possible interactions. Give your health care provider a list of all the medicines, herbs, non-prescription drugs, or dietary supplements you use. Also tell them if you smoke, drink alcohol, or use illegal drugs. Some items may interact with your medicine. What should I watch for while using this medicine? Visit your doctor or healthcare professional regularly. Tell your doctor or healthcare professional if your symptoms do not start to get better or if they get worse. You may need blood work done while you are taking this medicine. You may need to follow a special diet. Talk to your doctor. Foods that contain iron include: whole grains/cereals, dried fruits, beans, or peas, leafy green vegetables, and organ meats (liver, kidney). What side effects may I notice from receiving this medicine? Side effects that you should report to your doctor or health care professional as soon as possible: -allergic reactions like skin rash, itching or hives, swelling of the face, lips, or tongue -breathing problems -changes in blood pressure -feeling faint or lightheaded, falls -fever or chills -flushing, sweating, or hot feelings -swelling of the ankles or feet Side effects that usually do not require medical attention (report to your doctor or health care professional if they continue or are bothersome): -diarrhea -headache -nausea, vomiting -stomach pain This list may not describe all possible side effects. Call your doctor for medical advice about side effects. You may report side effects to FDA at 1-800-FDA-1088. Where should I keep my medicine? This drug is given in a hospital or clinic and will not be stored at home. NOTE: This sheet is a summary. It may not cover all possible information. If you have questions about this medicine, talk to your doctor,  pharmacist, or health care provider.  2018 Elsevier/Gold Standard (2015-12-02 12:41:49)   

## 2018-06-06 LAB — BPAM RBC
BLOOD PRODUCT EXPIRATION DATE: 201908192359
BLOOD PRODUCT EXPIRATION DATE: 201908192359
ISSUE DATE / TIME: 201907240941
ISSUE DATE / TIME: 201907240941
UNIT TYPE AND RH: 5100
Unit Type and Rh: 5100

## 2018-06-06 LAB — TYPE AND SCREEN
ABO/RH(D): O POS
ANTIBODY SCREEN: NEGATIVE
UNIT DIVISION: 0
Unit division: 0

## 2018-06-11 ENCOUNTER — Ambulatory Visit (HOSPITAL_COMMUNITY)
Admission: RE | Admit: 2018-06-11 | Discharge: 2018-06-11 | Disposition: A | Discharge status: Home / Self Care | Payer: Medicare Other | Source: Ambulatory Visit | Attending: Internal Medicine | Admitting: Internal Medicine

## 2018-06-11 DIAGNOSIS — D509 Iron deficiency anemia, unspecified: Secondary | ICD-10-CM | POA: Diagnosis not present

## 2018-06-11 MED ORDER — SODIUM CHLORIDE 0.9 % IV SOLN
510.0000 mg | Freq: Once | INTRAVENOUS | Status: AC
  Administered 2018-06-11: 510 mg via INTRAVENOUS
  Filled 2018-06-11: qty 17

## 2018-06-11 MED ORDER — SODIUM CHLORIDE 0.9 % IV SOLN
INTRAVENOUS | Status: DC
  Administered 2018-06-11: 09:00:00 via INTRAVENOUS

## 2018-06-11 NOTE — Discharge Instructions (Signed)

## 2018-06-11 NOTE — Progress Notes (Signed)
PATIENT CARE CENTER NOTE  Diagnosis: Iron Deficiency Anemia    Provider: Dr. Scarlette Calico   Procedure: IV Feraheme    Note: Patient received infusion of Feraheme. Monitored patient 30 minutes post infusion. Vitals signs stable. No adverse reaction noted. Discharge instructions given. Patient alert, oriented and ambulatory at discharge.

## 2018-06-14 ENCOUNTER — Encounter: Payer: Self-pay | Admitting: Gastroenterology

## 2018-06-14 ENCOUNTER — Ambulatory Visit: Payer: Medicare Other | Admitting: Gastroenterology

## 2018-06-14 VITALS — BP 128/64 | HR 64 | Temp 98.2°F | Resp 16 | Ht 65.0 in | Wt 161.0 lb

## 2018-06-14 DIAGNOSIS — I4891 Unspecified atrial fibrillation: Secondary | ICD-10-CM | POA: Diagnosis not present

## 2018-06-14 DIAGNOSIS — D869 Sarcoidosis, unspecified: Secondary | ICD-10-CM | POA: Diagnosis not present

## 2018-06-14 DIAGNOSIS — K219 Gastro-esophageal reflux disease without esophagitis: Secondary | ICD-10-CM | POA: Diagnosis not present

## 2018-06-14 DIAGNOSIS — I10 Essential (primary) hypertension: Secondary | ICD-10-CM | POA: Diagnosis not present

## 2018-06-14 DIAGNOSIS — K259 Gastric ulcer, unspecified as acute or chronic, without hemorrhage or perforation: Secondary | ICD-10-CM

## 2018-06-14 DIAGNOSIS — D508 Other iron deficiency anemias: Secondary | ICD-10-CM

## 2018-06-14 DIAGNOSIS — D509 Iron deficiency anemia, unspecified: Secondary | ICD-10-CM | POA: Diagnosis not present

## 2018-06-14 MED ORDER — SODIUM CHLORIDE 0.9 % IV SOLN: 500.0000 mL | Freq: Once | INTRAVENOUS | Status: DC

## 2018-06-14 MED ORDER — PANTOPRAZOLE SODIUM 40 MG PO TBEC: 40.0000 mg | DELAYED_RELEASE_TABLET | Freq: Every day | ORAL | 3 refills | Status: DC

## 2018-06-14 NOTE — Progress Notes (Signed)
Pt. Had first iron transfusion 06/11/2018.

## 2018-06-14 NOTE — Progress Notes (Signed)
Called to room to assist during endoscopic procedure.  Patient ID and intended procedure confirmed with present staff. Received instructions for my participation in the procedure from the performing physician.  

## 2018-06-14 NOTE — Op Note (Signed)
[facility] Patient Name: Richard Blake Procedure Date: 06/14/2018 2:19 PM MRN: [id number] Endoscopist: Jackquline Denmark , MD Age: 72 Referring MD:  Date of Birth: 04/01/1946 Gender: Male Account #: [id number] Procedure:                Upper GI endoscopy Indications:              Iron deficiency anemia secondary to chronic blood                            loss Medicines:                Monitored Anesthesia Care Procedure:                Pre-Anesthesia Assessment:                           - Prior to the procedure, a History and Physical                            was performed, and patient medications and                            allergies were reviewed. The patient's tolerance of                            previous anesthesia was also reviewed. The risks                            and benefits of the procedure and the sedation                            options and risks were discussed with the patient.                            All questions were answered, and informed consent                            was obtained. Prior Anticoagulants: The patient has                            taken Xarelto (rivaroxaban), last dose was 2 days                            prior to procedure. ASA Grade Assessment: III - A                            patient with severe systemic disease. After                            reviewing the risks and benefits, the patient was                            deemed in satisfactory condition to undergo the  procedure.                           After obtaining informed consent, the endoscope was                            passed under direct vision. Throughout the                            procedure, the patient's blood pressure, pulse, and                            oxygen saturations were monitored continuously. The                            Endoscope was introduced through the mouth, and                            advanced  to the third part of duodenum. The upper                            GI endoscopy was accomplished without difficulty.                            The patient tolerated the procedure well. Scope In: Scope Out: Findings:                 The distal esophagus was moderately tortuous.                           A large hiatal hernia was found with 2 cameron                            erosions at the diaphragmatic hiatus. The proximal                            extent of the gastric folds (end of tubular                            esophagus) was 36 cm from the incisors. The hiatal                            narrowing was 46 cm from the incisors. Biopsies                            were taken with a cold forceps for histology from                            the erosions. No active bleeding. Estimated blood                            loss was minimal.                           Two  4-6 mm angiodysplastic lesions without bleeding                            were found in the second portion of the duodenum.                            Duodenal biopsies were not performed since previous                            biopsies were negative for celiac disease. Complications:            No immediate complications. Estimated Blood Loss:     Estimated blood loss was minimal. Impression:               - Large hiatal hernia with Lysbeth Galas erosions.                            Biopsied.                           - Two non-bleeding angiodysplastic lesions in the                            duodenum. Recommendation:           - Patient has a contact number available for                            emergencies. The signs and symptoms of potential                            delayed complications were discussed with the                            patient. Return to normal activities tomorrow.                            Written discharge instructions were provided to the                            patient.                            - Resume previous diet.                           - Continue present medications.                           - Resume Xarelto (rivaroxaban) at prior dose in 1                            day.                           - Await pathology results.                           -  start Protonix 40 mg by mouth once a day #30 with                            6 refills.                           -continue to monitor hemoglobin/hematocrit and                            transfuse as needed.                           -FU in the GI clinic in 3-4 weeks. May need Capsule                            Endoscopy. Will discuss with Dr. Ardis Hughs. Jackquline Denmark, MD 06/14/2018 2:52:54 PM This report has been signed electronically.

## 2018-06-14 NOTE — Patient Instructions (Signed)
Thank you for allowing [place] to care for you today!  Await pathology results by mail, 2-3 weeks.  Follow up in GI clinic in 3-4 weeks.  Resume Xarelto at prior dose tomorrow (Saturday 06/15/2018)  Start Protonix 40 mg by mouth once a day. Dispense 30 with 6 refills.     YOU HAD AN ENDOSCOPIC PROCEDURE TODAY AT [facility]:   Refer to the procedure report that was given to you for any specific questions about what was found during the examination.  If the procedure report does not answer your questions, please call your gastroenterologist to clarify.  If you requested that your care partner not be given the details of your procedure findings, then the procedure report has been included in a sealed envelope for you to review at your convenience later.  YOU SHOULD EXPECT: Some feelings of bloating in the abdomen. Passage of more gas than usual.  Walking can help get rid of the air that was put into your GI tract during the procedure and reduce the bloating. If you had a lower endoscopy (such as a colonoscopy or flexible sigmoidoscopy) you may notice spotting of blood in your stool or on the toilet paper. If you underwent a bowel prep for your procedure, you may not have a normal bowel movement for a few days.  Please Note:  You might notice some irritation and congestion in your nose or some drainage.  This is from the oxygen used during your procedure.  There is no need for concern and it should clear up in a day or so.  SYMPTOMS TO REPORT IMMEDIATELY:   Following lower endoscopy (colonoscopy or flexible sigmoidoscopy):  Excessive amounts of blood in the stool  Significant tenderness or worsening of abdominal pains  Swelling of the abdomen that is new, acute  Fever of 100F or higher   Following upper endoscopy (EGD)  Vomiting of blood or coffee ground material  New chest pain or pain under the shoulder blades  Painful or persistently difficult swallowing  New shortness of  breath  Fever of 100F or higher  Black, tarry-looking stools  For urgent or emergent issues, a gastroenterologist can be reached at any hour by calling [phone].   DIET:  We do recommend a small meal at first, but then you may proceed to your regular diet.  Drink plenty of fluids but you should avoid alcoholic beverages for 24 hours.  ACTIVITY:  You should plan to take it easy for the rest of today and you should NOT DRIVE or use heavy machinery until tomorrow (because of the sedation medicines used during the test).    FOLLOW UP: Our staff will call the number listed on your records the next business day following your procedure to check on you and address any questions or concerns that you may have regarding the information given to you following your procedure. If we do not reach you, we will leave a message.  However, if you are feeling well and you are not experiencing any problems, there is no need to return our call.  We will assume that you have returned to your regular daily activities without incident.  If any biopsies were taken you will be contacted by phone or by letter within the next 1-3 weeks.  Please call us at [phone] if you have not heard about the biopsies in 3 weeks.    SIGNATURES/CONFIDENTIALITY: You and/or your care partner have signed paperwork which will be entered into  your electronic medical record.  These signatures attest to the fact that that the information above on your After Visit Summary has been reviewed and is understood.  Full responsibility of the confidentiality of this discharge information lies with you and/or your care-partner.

## 2018-06-17 ENCOUNTER — Telehealth: Payer: Self-pay | Admitting: *Deleted

## 2018-06-17 NOTE — Telephone Encounter (Signed)
  Follow up Call-  Call back number 06/14/2018 07/10/2016  Post procedure Call Back phone  # [phone]  Permission to leave phone message Yes Yes  Some recent data might be hidden     Patient questions:  Do you have a fever, pain , or abdominal swelling? No. Pain Score  0 *  Have you tolerated food without any problems? Yes.    Have you been able to return to your normal activities? Yes.    Do you have any questions about your discharge instructions: Diet   No. Medications  No. Follow up visit  No.  Do you have questions or concerns about your Care? No.  Actions: * If pain score is 4 or above: No action needed, pain <4.

## 2018-06-27 ENCOUNTER — Encounter: Payer: Self-pay | Admitting: Vascular Surgery

## 2018-06-27 ENCOUNTER — Ambulatory Visit (INDEPENDENT_AMBULATORY_CARE_PROVIDER_SITE_OTHER): Payer: Medicare Other | Admitting: Vascular Surgery

## 2018-06-27 ENCOUNTER — Other Ambulatory Visit: Payer: Self-pay

## 2018-06-27 ENCOUNTER — Ambulatory Visit (HOSPITAL_COMMUNITY)
Admission: RE | Admit: 2018-06-27 | Discharge: 2018-06-27 | Disposition: A | Discharge status: Home / Self Care | Payer: Medicare Other | Source: Ambulatory Visit | Attending: Vascular Surgery | Admitting: Vascular Surgery

## 2018-06-27 VITALS — BP 134/84 | HR 44 | Temp 97.4°F | Resp 18 | Ht 65.0 in | Wt 158.4 lb

## 2018-06-27 DIAGNOSIS — I83892 Varicose veins of left lower extremities with other complications: Secondary | ICD-10-CM | POA: Diagnosis not present

## 2018-06-27 DIAGNOSIS — I83893 Varicose veins of bilateral lower extremities with other complications: Secondary | ICD-10-CM | POA: Diagnosis not present

## 2018-06-27 DIAGNOSIS — I868 Varicose veins of other specified sites: Secondary | ICD-10-CM

## 2018-06-27 NOTE — Progress Notes (Signed)
Referring Physician: Dr Scarlette Calico  Patient name: Richard Blake MRN: [id number] DOB: 1945-12-17 Sex: male  REASON FOR CONSULT: Varicose veins with pain and bleeding and swelling HPI: Richard Blake is a 72 y.o. male has had varicose veins for several years.  He has had chronic intermittent swelling of both lower extremities.  This sometimes improves with diuretic.  He also gets some improvement if he elevates his legs.  Recently he had a bleeding episode in his left foot from a varicosity.  This required an emergency room visit.  This was on Apr 05, 2018.  He denies prior history of DVT.  He does have family history of varicose veins in his mother and sister.  Other medical problems include atrial fibrillation on Xarelto.  This is currently stable.  He has a history of hypertension hyperlipidemia both of which are currently stable.  Past Medical History:  Diagnosis Date  . A-fib ([place]) 11/2016   Tx with po meds at this time  . Anemia    Microcytic with Fe Sat 4% 10/31/10  . Atypical chest pain   . Cataract    removed bilaterally  . Chronic rhinitis   . Cough   . Diverticulosis   . Dyspnea    " WITH MY AFIB "  . GERD (gastroesophageal reflux disease)   . Hiatal hernia   . Hiatal hernia   . History of recent blood transfusion 06/05/2018  . HLD (hyperlipidemia)   . HTN (hypertension)   . Interstitial lung disease ([place])   . Kidney stones   . Osteoarthritis   . Sarcoidosis 8/99   Transbronchial biopsy, taking daily prednisone since  November 08 on and off since 1999 w/cough every time he relapses off it for more than several weeks  . 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 CARDIOVERSION N/A 12/21/2016   Procedure: CARDIOVERSION;  Surgeon: Evans Lance, MD;  Location: [facility];  Service: Cardiovascular;  Laterality: N/A;  . CARDIOVERSION N/A 01/31/2017   Procedure:  CARDIOVERSION;  Surgeon: Pixie Casino, MD;  Location: [facility] ENDOSCOPY;  Service: Cardiovascular;  Laterality: N/A;  . CATARACT EXTRACTION Left 10/14/2013  . CATARACT EXTRACTION Right 11/24/2013  . CERVICAL FUSION     pt states he did not have a cervical fusion, just a biopsy   . COLONOSCOPY  01/12/2006   tics only   . INGUINAL HERNIA REPAIR     right  . LIPOMA EXCISION Left 09/16/2004   forearm  . spinal cord biopsyOther]  1996   sarcoid    Family History  Problem Relation Age of Onset  . Asthma Mother   . Diabetes Mother   . Arthritis Paternal Grandmother   . Heart disease Paternal Grandmother   . Hypertension Father        Fathers family most  . Prostate cancer Father   . Kidney disease Father   . Colon polyps Sister   . Multiple myeloma Sister   . Heart disease Paternal Uncle   . Kidney disease Sister   . Sarcoidosis Neg Hx   . Other Neg Hx        osteoporosis  . Colon cancer Neg Hx   . Esophageal cancer Neg Hx   . Rectal cancer Neg Hx   . Stomach cancer Neg Hx     SOCIAL HISTORY: Social History   Socioeconomic History  . Marital status: Single  Spouse name: Not on file  . Number of children: 0  . Years of education: Not on file  . Highest education level: Not on file  Occupational History  . Occupation: Retired    Fish farm manager: [employer]  . Financial resource strain: Not on file  . Food insecurity:    Worry: Not on file    Inability: Not on file  . Transportation needs:    Medical: Not on file    Non-medical: Not on file  Tobacco Use  . Smoking status: Never Smoker  . Smokeless tobacco: Never Used  Substance and Sexual Activity  . Alcohol use: No    Alcohol/week: 0.0 standard drinks  . Drug use: No  . Sexual activity: Not Currently  Lifestyle  . Physical activity:    Days per week: Not on file    Minutes per session: Not on file  . Stress: Not on file  Relationships  . Social connections:    Talks on phone: Not on file     Gets together: Not on file    Attends religious service: Not on file    Active member of club or organization: Not on file    Attends meetings of clubs or organizations: Not on file    Relationship status: Not on file  . Intimate partner violence:    Fear of current or ex partner: Not on file    Emotionally abused: Not on file    Physically abused: Not on file    Forced sexual activity: Not on file  Other Topics Concern  . Not on file  Social History Narrative   Regular Exercise -  NO    Allergies  Allergen Reactions  . Amlodipine Other (See Comments)    Edema   . Welchol [Colesevelam Hcl] Other (See Comments)    Stomach cramps  . Other Swelling    Hair dye - facial swelling   . Prolia [Denosumab] Other (See Comments)    Pain at injection sight    Current Outpatient Medications  Medication Sig Dispense Refill  . acetaminophen (TYLENOL) 500 MG tablet Take 500-1,000 mg by mouth every 6 (six) hours as needed (for pain from muscle spasms).    . Cholecalciferol 2000 units TABS Take 1 tablet (2,000 Units total) by mouth daily. 90 tablet 1  . Dietary Management Product (FOSTEUM PLUS) CAPS Take 1 capsule by mouth 2 (two) times daily. 60 capsule 11  . famotidine (PEPCID) 20 MG tablet Take 20 mg by mouth at bedtime as needed for heartburn or indigestion.     . ferrous sulfate 325 (65 FE) MG tablet Take 1 tablet (325 mg total) by mouth 3 (three) times daily with meals. 90 tablet 5  . finasteride (PROSCAR) 5 MG tablet TAKE 1 TABLET BY MOUTH ONCE DAILY 90 tablet 0  . losartan (COZAAR) 100 MG tablet TAKE 1 TABLET BY MOUTH ONCE DAILY 90 tablet 2  . metoprolol tartrate (LOPRESSOR) 25 MG tablet Take 1 tablet (25 mg total) by mouth 2 (two) times daily. 180 tablet 2  . pantoprazole (PROTONIX) 40 MG tablet Take 1 tablet (40 mg total) by mouth daily. 90 tablet 3  . rosuvastatin (CRESTOR) 10 MG tablet take 1 tablet by mouth once daily 90 tablet 1  . torsemide (DEMADEX) 20 MG tablet Take 1 tablet  (20 mg total) by mouth 2 (two) times daily. 180 tablet 3  . vitamin B-12 (CYANOCOBALAMIN) 250 MCG tablet Take 250 mcg by mouth daily.      Marland Kitchen  XARELTO 15 MG TABS tablet TAKE 1 TABLET BY MOUTH ONCE DAILY WITH SUPPER 90 tablet 0   No current facility-administered medications for this visit.     ROS:   General:  No weight loss, Fever, chills  HEENT: No recent headaches, no nasal bleeding, no visual changes, no sore throat  Neurologic: No dizziness, blackouts, seizures. No recent symptoms of stroke or mini- stroke. No recent episodes of slurred speech, or temporary blindness.  Cardiac: No recent episodes of chest pain/pressure, no shortness of breath at rest.  + shortness of breath with exertion.  + history of atrial fibrillation or irregular heartbeat  Vascular: No history of rest pain in feet.  No history of claudication.  No history of non-healing ulcer, No history of DVT   Pulmonary: No home oxygen, no productive cough, no hemoptysis,  No asthma or wheezing  Musculoskeletal:  _0  Arthritis, _1  Low back pain,  _2  Joint pain  Hematologic:No history of hypercoagulable state.  +history of easy bleeding.  + history of anemia  Gastrointestinal: No hematochezia or melena,  No gastroesophageal reflux, no trouble swallowing  Urinary: _3  chronic Kidney disease, _4  on HD - _5  MWF or _6  TTHS, _7  Burning with urination, _8  Frequent urination, _9  Difficulty urinating;   Skin: No rashes  Psychological: No history of anxiety,  No history of depression   Physical Examination  Vitals:   06/27/18 1511  BP: 134/84  Pulse: (!) 44  Resp: 18  Temp: (!) 97.4 F (36.3 C)  TempSrc: Oral  SpO2: 100%  Weight: 158 lb 6.4 oz (71.8 kg)  Height: _10  (1.651 m)    Body mass index is 26.36 kg/m.  General:  Alert and oriented, no acute distress HEENT: Normal Neck: No bruit or JVD Pulmonary: Clear to auscultation bilaterally Cardiac: Regular Rate and Rhythm without murmur Abdomen: Soft,  non-tender, non-distended, no mass Skin: No rash, large cluster varicosities left thigh knee area as well as right knee area with some reticular veins and spider veins on the feet bilaterally Extremity Pulses:  2+ radial, brachial, femoral, dorsalis pedis, posterior tibial pulses bilaterally Musculoskeletal: No deformity trace pretibial edema  Neurologic: Upper and lower extremity motor 5/5 and symmetric  DATA:  Patient had a venous reflux exam today.  This showed deep vein reflux bilaterally.  Right greater saphenous was incompetent with reflux diameter vein 5 to 6 mm, left greater saphenous vein 4 to 9 mm also with diffuse reflux.  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  He is at very high risk for rebleeding due to his varicosities as well as the fact that he is on Xarelto for his atrial fibrillation.  PLAN:  The patient was fitted for thigh-high compression stockings today.  He will wear these during the day and see if he gets improvement of his symptoms overall.  He will return in 3 months time for consideration of laser ablation of the left and right greater saphenous veins as well as some possible some stab avulsions.  He will call us if he has recurrent bleeding episode prior to this.   Ruta Hinds, MD [facility] Office: [phone] Pager: [phone]

## 2018-06-30 ENCOUNTER — Encounter: Payer: Self-pay | Admitting: Gastroenterology

## 2018-07-03 DIAGNOSIS — I868 Varicose veins of other specified sites: Secondary | ICD-10-CM

## 2018-07-04 ENCOUNTER — Encounter (INDEPENDENT_AMBULATORY_CARE_PROVIDER_SITE_OTHER): Payer: Medicare Other | Admitting: Vascular Surgery

## 2018-07-05 ENCOUNTER — Encounter: Payer: Medicare Other | Admitting: Vascular Surgery

## 2018-07-05 ENCOUNTER — Encounter (HOSPITAL_COMMUNITY): Payer: Medicare Other

## 2018-07-10 ENCOUNTER — Encounter: Payer: Self-pay | Admitting: Gastroenterology

## 2018-07-10 ENCOUNTER — Other Ambulatory Visit (INDEPENDENT_AMBULATORY_CARE_PROVIDER_SITE_OTHER): Payer: Medicare Other

## 2018-07-10 ENCOUNTER — Other Ambulatory Visit: Payer: Self-pay

## 2018-07-10 ENCOUNTER — Ambulatory Visit (INDEPENDENT_AMBULATORY_CARE_PROVIDER_SITE_OTHER): Payer: Medicare Other | Admitting: Gastroenterology

## 2018-07-10 VITALS — BP 132/70 | HR 56 | Ht 65.0 in | Wt 158.2 lb

## 2018-07-10 DIAGNOSIS — K449 Diaphragmatic hernia without obstruction or gangrene: Secondary | ICD-10-CM

## 2018-07-10 DIAGNOSIS — D508 Other iron deficiency anemias: Secondary | ICD-10-CM

## 2018-07-10 DIAGNOSIS — K5520 Angiodysplasia of colon without hemorrhage: Secondary | ICD-10-CM

## 2018-07-10 LAB — CBC
HCT: 38.2 % — ABNORMAL LOW (ref 39.0–52.0)
HEMOGLOBIN: 11.8 g/dL — AB (ref 13.0–17.0)
MCHC: 30.9 g/dL (ref 30.0–36.0)
MCV: 79.5 fl (ref 78.0–100.0)
PLATELETS: 180 10*3/uL (ref 150.0–400.0)
RBC: 4.8 Mil/uL (ref 4.22–5.81)
RDW: 31.3 % — ABNORMAL HIGH (ref 11.5–15.5)
WBC: 4.3 10*3/uL (ref 4.0–10.5)

## 2018-07-10 NOTE — Progress Notes (Signed)
07/10/2018 Richard Blake [id number] 1946-01-31   HISTORY OF PRESENT ILLNESS:  This is a 72 year old male who is here for follow-up of acute on chronic, hemoglobin of 5.5 grams on 04/22/2018.  Has received 2 units PRBC's and 2 doses of IV iron.  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  Is feeling much better.  Is also still on ferrous sulfate 325 mg TID and is tolerating it well.  Is asking about the pantoprazole since he would prefer to take H2 blocker if he does not absolutely need PPI.  Is on Xarelto of atrial fibrillation.  Past Medical History:  Diagnosis Date  . A-fib ([place]) 11/2016   Tx with po meds at this time  . Anemia    Microcytic with Fe Sat 4% 10/31/10  . Atypical chest pain   . Cataract    removed bilaterally  . Chronic rhinitis   . Cough   . Diverticulosis   . Dyspnea    " WITH MY AFIB "  . GERD (gastroesophageal reflux disease)   . Hiatal hernia   . Hiatal hernia   . History of recent blood transfusion 06/05/2018  . HLD (hyperlipidemia)   . HTN (hypertension)   . Interstitial lung disease ([place])   . Kidney stones   . Osteoarthritis   . Sarcoidosis 8/99   Transbronchial biopsy, taking daily prednisone since  November 08 on and off since 1999 w/cough every time he relapses off it for more than several weeks  . 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 CARDIOVERSION N/A 12/21/2016   Procedure: CARDIOVERSION;  Surgeon: Evans Lance, MD;  Location: [facility];  Service: Cardiovascular;  Laterality: N/A;  . CARDIOVERSION N/A 01/31/2017   Procedure: CARDIOVERSION;  Surgeon: Pixie Casino, MD;  Location[facility] ENDOSCOPY;  Service: Cardiovascular;  Laterality: N/A;  . CATARACT EXTRACTION Left 10/14/2013  . CATARACT EXTRACTION Right 11/24/2013  . CERVICAL FUSION     pt states he did not have a cervical fusion, just  a biopsy   . COLONOSCOPY  01/12/2006   tics only   . INGUINAL HERNIA REPAIR     right  . LIPOMA EXCISION Left 09/16/2004   forearm  . spinal cord biopsyOther]  1996   sarcoid    reports that he has never smoked. He has never used smokeless tobacco. He reports that he does not drink alcohol or use drugs. family history includes Arthritis in his paternal grandmother; Asthma in his mother; Colon polyps in his sister; Diabetes in his mother; Heart disease in his paternal grandmother and paternal uncle; Hypertension in his father; Kidney disease in his father and sister; Multiple myeloma in his sister; Prostate cancer in his father. Allergies  Allergen Reactions  . Amlodipine Other (See Comments)    Edema   . Welchol [Colesevelam Hcl] Other (See Comments)    Stomach cramps  . Other Swelling    Hair dye - facial swelling   . Prolia [Denosumab] Other (See Comments)    Pain at injection sight      Outpatient Encounter Medications as of 07/10/2018  Medication Sig  . acetaminophen (TYLENOL) 500 MG tablet Take 500-1,000 mg by mouth every 6 (six) hours as needed (for pain from muscle spasms).  . Cholecalciferol 2000 units TABS Take 1 tablet (2,000 Units total) by mouth daily.  . Dietary  Management Product (FOSTEUM PLUS) CAPS Take 1 capsule by mouth 2 (two) times daily.  . famotidine (PEPCID) 20 MG tablet Take 20 mg by mouth at bedtime as needed for heartburn or indigestion.   . ferrous sulfate 325 (65 FE) MG tablet Take 1 tablet (325 mg total) by mouth 3 (three) times daily with meals.  . finasteride (PROSCAR) 5 MG tablet TAKE 1 TABLET BY MOUTH ONCE DAILY  . losartan (COZAAR) 100 MG tablet TAKE 1 TABLET BY MOUTH ONCE DAILY  . metoprolol tartrate (LOPRESSOR) 25 MG tablet Take 1 tablet (25 mg total) by mouth 2 (two) times daily.  . pantoprazole (PROTONIX) 40 MG tablet Take 1 tablet (40 mg total) by mouth daily.  . rosuvastatin (CRESTOR) 10 MG tablet take 1 tablet by mouth once daily  .  torsemide (DEMADEX) 20 MG tablet Take 1 tablet (20 mg total) by mouth 2 (two) times daily.  . vitamin B-12 (CYANOCOBALAMIN) 250 MCG tablet Take 250 mcg by mouth daily.    Alveda Reasons 15 MG TABS tablet TAKE 1 TABLET BY MOUTH ONCE DAILY WITH SUPPER  . [DISCONTINUED] omeprazole (PRILOSEC) 20 MG capsule Take 20 mg by mouth daily.     No facility-administered encounter medications on file as of 07/10/2018.      REVIEW OF SYSTEMS  : All other systems reviewed and negative except where noted in the History of Present Illness.   PHYSICAL EXAM: BP 132/70   Pulse (!) 56   Ht _0  (1.651 m)   Wt 158 lb 4 oz (71.8 kg)   BMI 26.33 kg/m  General: Well developed black male in no acute distress Head: Normocephalic and atraumatic Eyes:  Sclerae anicteric, conjunctiva pink. Ears: Normal auditory acuity Lungs: Clear throughout to auscultation; no increased WOB. Heart: Irregularly irregular. Abdomen: Soft, non-distended.  BS present.  Non-tender. Musculoskeletal: Symmetrical with no gross deformities  Skin: No lesions on visible extremities Extremities: No edema  Neurological: Alert oriented x 4, grossly non-focal Psychological:  Alert and cooperative. Normal mood and affect  ASSESSMENT AND PLAN: 1.  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  Has received 2 units PRBC's and 2 doses of IV iron.  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  Hard to know if this recurrent anemia is coming from Cameron's erosions or AVM's (could have more lower in small bowel as well and could bleed intermittently in the setting of chronic anticoagulation). 2.  Chronic anticoagulation: With Xarelto for A. Fib  *Will recheck CBC today and then will monitor it monthly for a while.  If anemia recurs then ? WCE while on anticoagulation. *He is asking if he really needs the pantoprazole.  Says that he is willing to take it if he really needs it, but  prefers to be on H2 blocker instead if PPIs not necessary.  I have advised him that he can go on Pepcid 20 mg twice daily in place of the Protonix.  Will check with Dr. Ardis Hughs, and if he wants him on PPI then will contact the patient and let him know otherwise.   CC:  Janith Lima, MD

## 2018-07-10 NOTE — Progress Notes (Signed)
With the [facility] and gastric Cameron's erosions I recommend PPI.  Otherwise I agree with the note, plan above

## 2018-07-10 NOTE — Patient Instructions (Signed)
Your provider has requested that you go to the basement level for lab work before leaving today. Press "B" on the elevator. The lab is located at the first door on the left as you exit the elevator.  Stop Pantoprazole 40 mg   Start Pepcid 20 mg twice a day

## 2018-07-17 NOTE — Telephone Encounter (Signed)
Per message from Dr. Loanne Drilling, concerning patient's refusal to return my calls, about scheduling infusion, he said to wait to see if patient returns for a follow up appointment with him.  He will discuss it than.

## 2018-07-23 DIAGNOSIS — H1859 Other hereditary corneal dystrophies: Secondary | ICD-10-CM | POA: Diagnosis not present

## 2018-07-23 DIAGNOSIS — H26493 Other secondary cataract, bilateral: Secondary | ICD-10-CM | POA: Diagnosis not present

## 2018-07-23 DIAGNOSIS — Z961 Presence of intraocular lens: Secondary | ICD-10-CM | POA: Diagnosis not present

## 2018-08-20 ENCOUNTER — Other Ambulatory Visit: Payer: Self-pay | Admitting: Internal Medicine

## 2018-08-20 DIAGNOSIS — N401 Enlarged prostate with lower urinary tract symptoms: Principal | ICD-10-CM

## 2018-08-20 DIAGNOSIS — I4819 Other persistent atrial fibrillation: Secondary | ICD-10-CM

## 2018-08-20 DIAGNOSIS — N138 Other obstructive and reflux uropathy: Secondary | ICD-10-CM

## 2018-08-20 DIAGNOSIS — M9903 Segmental and somatic dysfunction of lumbar region: Secondary | ICD-10-CM | POA: Diagnosis not present

## 2018-08-20 DIAGNOSIS — M9904 Segmental and somatic dysfunction of sacral region: Secondary | ICD-10-CM | POA: Diagnosis not present

## 2018-08-20 DIAGNOSIS — M5136 Other intervertebral disc degeneration, lumbar region: Secondary | ICD-10-CM | POA: Diagnosis not present

## 2018-08-20 DIAGNOSIS — M9905 Segmental and somatic dysfunction of pelvic region: Secondary | ICD-10-CM | POA: Diagnosis not present

## 2018-08-28 DIAGNOSIS — M9905 Segmental and somatic dysfunction of pelvic region: Secondary | ICD-10-CM | POA: Diagnosis not present

## 2018-08-28 DIAGNOSIS — M9904 Segmental and somatic dysfunction of sacral region: Secondary | ICD-10-CM | POA: Diagnosis not present

## 2018-08-28 DIAGNOSIS — M5136 Other intervertebral disc degeneration, lumbar region: Secondary | ICD-10-CM | POA: Diagnosis not present

## 2018-08-28 DIAGNOSIS — M9903 Segmental and somatic dysfunction of lumbar region: Secondary | ICD-10-CM | POA: Diagnosis not present

## 2018-09-04 DIAGNOSIS — M9904 Segmental and somatic dysfunction of sacral region: Secondary | ICD-10-CM | POA: Diagnosis not present

## 2018-09-04 DIAGNOSIS — M9905 Segmental and somatic dysfunction of pelvic region: Secondary | ICD-10-CM | POA: Diagnosis not present

## 2018-09-04 DIAGNOSIS — M5136 Other intervertebral disc degeneration, lumbar region: Secondary | ICD-10-CM | POA: Diagnosis not present

## 2018-09-04 DIAGNOSIS — M9903 Segmental and somatic dysfunction of lumbar region: Secondary | ICD-10-CM | POA: Diagnosis not present

## 2018-09-11 DIAGNOSIS — M9904 Segmental and somatic dysfunction of sacral region: Secondary | ICD-10-CM | POA: Diagnosis not present

## 2018-09-11 DIAGNOSIS — M9903 Segmental and somatic dysfunction of lumbar region: Secondary | ICD-10-CM | POA: Diagnosis not present

## 2018-09-11 DIAGNOSIS — M5136 Other intervertebral disc degeneration, lumbar region: Secondary | ICD-10-CM | POA: Diagnosis not present

## 2018-09-11 DIAGNOSIS — M9905 Segmental and somatic dysfunction of pelvic region: Secondary | ICD-10-CM | POA: Diagnosis not present

## 2018-09-16 NOTE — Progress Notes (Addendum)
Subjective:   Richard Blake is a 72 y.o. male who presents for Medicare Annual/Subsequent preventive examination.  Review of Systems:  No ROS.  Medicare Wellness Visit. Additional risk factors are reflected in the social history.  Cardiac Risk Factors include: advanced age (>57mn, >>14women);dyslipidemia;hypertension;male gender Sleep patterns: gets up 1-2 times nightly to void and sleeps 5-6 hours nightly.    Home Safety/Smoke Alarms: Feels safe in home. Smoke alarms in place.  Living environment; residence and Firearm Safety: 1-story house/ trailer, no firearms. Lives alone, no needs for DME, good support system Seat Belt Safety/Bike Helmet: Wears seat belt.   PSA-  Lab Results  Component Value Date   PSA 1.2 04/23/2018   PSA 4.21 (H) 09/25/2017   PSA 3.67 02/23/2016       Objective:    Vitals: BP 136/62   Pulse (!) 56   Resp 17   Ht _0  (1.651 m)   Wt 170 lb (77.1 kg)   SpO2 99%   BMI 28.29 kg/m   Body mass index is 28.29 kg/m.  Advanced Directives 09/17/2018 09/13/2017 01/29/2017 12/21/2016 07/10/2016 06/28/2016 03/08/2016  Does Patient Have a Medical Advance Directive? _1  No No  Would patient like information on creating a medical advance directive? Yes (ED - Information included in AVS) Yes (ED - Information included in AVS) No - Patient declined No - Patient declined - - No - patient declined information    Tobacco Social History   Tobacco Use  Smoking Status Never Smoker  Smokeless Tobacco Never Used     Counseling given: Not Answered  Past Medical History:  Diagnosis Date  . [place] 11[place]   Tx with po meds at this time  . Anemia    Microcytic with Fe Sat 4% 10/31/10  . Atypical chest pain   . Cataract    removed bilaterally  . Chronic rhinitis   . Cough   . Diverticulosis   . Dyspnea    " WITH MY AFIB "  . GERD (gastroesophageal reflux disease)   . Hiatal hernia   . Hiatal hernia   . History of recent blood transfusion  06/05/2018  . HLD (hyperlipidemia)   . HTN (hypertension)   . [place]   . Kidney stones   . Osteoarthritis   . Osteoporosis 2014  . Sarcoidosis 8/99   Transbronchial biopsy, taking daily prednisone since  November 08 on and off since 1999 w/cough every time he relapses off it for more than several weeks  . 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 CARDIOVERSION N/A 12/21/2016   Procedure: CARDIOVERSION;  Surgeon: GEvans Lance MD;  [place]  Service: Cardiovascular;  Laterality: N/A;  . CARDIOVERSION N/A 01/31/2017   Procedure: CARDIOVERSION;  Surgeon: KPixie Casino MD;  [facility]  Service: Cardiovascular;  Laterality: N/A;  . CATARACT EXTRACTION Left 10/14/2013  . CATARACT EXTRACTION Right 11/24/2013  . CERVICAL FUSION     pt states he did not have a cervical fusion, just a biopsy   . COLONOSCOPY  01/12/2006   tics only   . INGUINAL HERNIA REPAIR     right  . LIPOMA EXCISION Left 09/16/2004   forearm  . spinal cord biopsyOther]  1996   sarcoid   Family History  Problem Relation Age of Onset  . Asthma Mother   . Diabetes  Mother   . Arthritis Paternal Grandmother   . Heart disease Paternal Grandmother   . Hypertension Father        Fathers family most  . Prostate cancer Father   . Kidney disease Father   . Colon polyps Sister   . Multiple myeloma Sister   . Heart disease Paternal Uncle   . Kidney disease Sister   . Sarcoidosis Neg Hx   . Other Neg Hx        osteoporosis  . Colon cancer Neg Hx   . Esophageal cancer Neg Hx   . Rectal cancer Neg Hx   . Stomach cancer Neg Hx    Social History   Socioeconomic History  . Marital status: Single    Spouse name: Not on file  . Number of children: 0  . Years of education: Not on file  . Highest education level: Not on file  Occupational History  . Occupation: Retired     Fish farm manager: [employer]  . Financial resource strain: Not very hard  . Food insecurity:    Worry: Never true    Inability: Never true  . Transportation needs:    Medical: No    Non-medical: No  Tobacco Use  . Smoking status: Never Smoker  . Smokeless tobacco: Never Used  Substance and Sexual Activity  . Alcohol use: No    Alcohol/week: 0.0 standard drinks  . Drug use: No  . Sexual activity: Not Currently  Lifestyle  . Physical activity:    Days per week: 0 days    Minutes per session: 0 min  . Stress: Only a little  Relationships  . Social connections:    Talks on phone: More than three times a week    Gets together: More than three times a week    Attends religious service: Never    Active member of club or organization: Yes    Attends meetings of clubs or organizations: 1 to 4 times per year    Relationship status: Not on file  Other Topics Concern  . Not on file  Social History Narrative   Regular Exercise -  NO    Outpatient Encounter Medications as of 09/17/2018  Medication Sig  . acetaminophen (TYLENOL) 500 MG tablet Take 500-1,000 mg by mouth every 6 (six) hours as needed (for pain from muscle spasms).  . Cholecalciferol 2000 units TABS Take 1 tablet (2,000 Units total) by mouth daily.  . Dietary Management Product (FOSTEUM PLUS) CAPS Take 1 capsule by mouth 2 (two) times daily. (Patient taking differently: Take 1 capsule by mouth daily. )  . famotidine (PEPCID) 20 MG tablet Take 20 mg by mouth at bedtime as needed for heartburn or indigestion.   . ferrous sulfate 325 (65 FE) MG tablet Take 1 tablet (325 mg total) by mouth 3 (three) times daily with meals.  . finasteride (PROSCAR) 5 MG tablet TAKE 1 TABLET BY MOUTH ONCE DAILY  . losartan (COZAAR) 100 MG tablet TAKE 1 TABLET BY MOUTH ONCE DAILY  . metoprolol tartrate (LOPRESSOR) 25 MG tablet Take 1 tablet (25 mg total) by mouth 2 (two) times daily.  . pantoprazole (PROTONIX) 40 MG tablet Take 1  tablet (40 mg total) by mouth daily.  . rosuvastatin (CRESTOR) 10 MG tablet Take 1 tablet (10 mg total) by mouth daily.  Marland Kitchen torsemide (DEMADEX) 20 MG tablet Take 1 tablet (20 mg total) by mouth 2 (two) times daily. (Patient taking differently: Take 20 mg by mouth  daily. )  . vitamin B-12 (CYANOCOBALAMIN) 250 MCG tablet Take 250 mcg by mouth daily.    Alveda Reasons 15 MG TABS tablet TAKE 1 TABLET BY MOUTH ONCE DAILY WITH SUPPER  . [DISCONTINUED] Dietary Management Product (FOSTEUM PLUS) CAPS Take 1 capsule by mouth 2 (two) times daily.  . [DISCONTINUED] omeprazole (PRILOSEC) 20 MG capsule Take 20 mg by mouth daily.    . [DISCONTINUED] rosuvastatin (CRESTOR) 10 MG tablet take 1 tablet by mouth once daily   No facility-administered encounter medications on file as of 09/17/2018.     Activities of Daily Living In your present state of health, do you have any difficulty performing the following activities: 09/17/2018 04/23/2018  Hearing? N N  Vision? N N  Difficulty concentrating or making decisions? N Y  Walking or climbing stairs? N N  Dressing or bathing? N N  Doing errands, shopping? N N  Preparing Food and eating ? N -  Using the Toilet? N -  In the past six months, have you accidently leaked urine? N -  Do you have problems with loss of bowel control? N -  Managing your Medications? N -  Managing your Finances? N -  Housekeeping or managing your Housekeeping? N -  Some recent data might be hidden    Patient Care Team: Janith Lima, MD as PCP - General Love, Alyson Locket, MD (Neurology) Tanda Rockers, MD as Attending Physician (Pulmonary Disease) Evans Lance, MD as Consulting Physician (Cardiology)   Assessment:   This is a routine wellness examination for Richard Blake. Physical assessment deferred to PCP.   Exercise Activities and Dietary recommendations Current Exercise Habits: The patient does not participate in regular exercise at present, Exercise limited by: orthopedic  condition(s)  Diet (meal preparation, eat out, water intake, caffeinated beverages, dairy products, fruits and vegetables): in general, a "healthy" diet  , well balanced.  eats a variety of fruits and vegetables daily, limits salt, fat/cholesterol, sugar,carbohydrates,caffeine, drinks 6-8 glasses of water daily.  Goals    . Patient Stated     I do want to monitor what I eat closer to maintain my health.     . Stay as healthy and as independent as possible     Think about down sizing and moving to independent living. Continue to enjoy reading, travel, and enjoy life.       Fall Risk Fall Risk  09/17/2018 04/23/2018 10/18/2017 04/20/2016 01/12/2015  Falls in the past year? 0 No No No No  Comment - - Emmi Telephone Survey: data to providers prior to load - -    Depression Screen PHQ 2/9 Scores 09/17/2018 04/23/2018 09/13/2017 04/20/2016  PHQ - 2 Score 0 0 1 0  PHQ- 9 Score - - 2 -    Cognitive Function MMSE - Mini Mental State Exam 09/13/2017  Orientation to time 5  Orientation to Place 5  Registration 3  Attention/ Calculation 5  Recall 2  Language- name 2 objects 2  Language- repeat 1  Language- follow 3 step command 3  Language- read & follow direction 1  Write a sentence 1  Copy design 1  Total score 29       Ad8 score reviewed for issues:  Issues making decisions: no  Less interest in hobbies / activities: no  Repeats questions, stories (family complaining): no  Trouble using ordinary gadgets (microwave, computer, phone):no  Forgets the month or year: no  Mismanaging finances: no  Remembering appts: no  Daily problems with  thinking and/or memory: no Ad8 score is= 0  Immunization History  Administered Date(s) Administered  . Influenza Whole 10/31/2010, 09/14/2011, 10/16/2012  . Influenza, High Dose Seasonal PF 07/20/2013, 08/28/2017  . Influenza,inj,Quad PF,6+ Mos 01/12/2015, 11/02/2015  . Influenza-Unspecified 08/22/2016  . Pneumococcal Conjugate-13 01/12/2015   . Pneumococcal Polysaccharide-23 12/14/2000, 01/19/2016  . Td 10/31/2010  . Zoster 04/20/2016   Screening Tests Health Maintenance  Topic Date Due  . INFLUENZA VACCINE  06/13/2018  . TETANUS/TDAP  10/31/2020  . COLONOSCOPY  07/13/2026  . Hepatitis C Screening  Completed  . PNA vac Low Risk Adult  Completed       Plan:    Refills for rosuvastatin and fosteum ordered. An appointment scheduled to see PCP 10/03/18.  Continue doing brain stimulating activities (puzzles, reading, adult coloring books, staying active) to keep memory sharp.   Continue to eat heart healthy diet (full of fruits, vegetables, whole grains, lean protein, water--limit salt, fat, and sugar intake) and increase physical activity as tolerated.  I have personally reviewed and noted the following in the patient's chart:   . Medical and social history . Use of alcohol, tobacco or illicit drugs  . Current medications and supplements . Functional ability and status . Nutritional status . Physical activity . Advanced directives . List of other physicians . Vitals . Screenings to include cognitive, depression, and falls . Referrals and appointments  In addition, I have reviewed and discussed with patient certain preventive protocols, quality metrics, and best practice recommendations. A written personalized care plan for preventive services as well as general preventive health recommendations were provided to patient.     Michiel Cowboy, RN  09/17/2018  Medical screening examination/treatment/procedure(s) were performed by non-physician practitioner and as supervising physician I was immediately available for consultation/collaboration. I agree with above. Scarlette Calico, MD

## 2018-09-17 ENCOUNTER — Ambulatory Visit: Payer: Medicare Other | Admitting: *Deleted

## 2018-09-17 VITALS — BP 136/62 | HR 56 | Resp 17 | Ht 65.0 in | Wt 170.0 lb

## 2018-09-17 DIAGNOSIS — E782 Mixed hyperlipidemia: Secondary | ICD-10-CM

## 2018-09-17 DIAGNOSIS — M818 Other osteoporosis without current pathological fracture: Secondary | ICD-10-CM

## 2018-09-17 DIAGNOSIS — Z0000 Encounter for general adult medical examination without abnormal findings: Secondary | ICD-10-CM

## 2018-09-17 MED ORDER — FOSTEUM PLUS PO CAPS: 1.0000 | ORAL_CAPSULE | Freq: Two times a day (BID) | ORAL | 11 refills | Status: DC

## 2018-09-17 MED ORDER — ROSUVASTATIN CALCIUM 10 MG PO TABS: 10.0000 mg | ORAL_TABLET | Freq: Every day | ORAL | 1 refills | Status: DC

## 2018-09-17 NOTE — Patient Instructions (Addendum)
Continue doing brain stimulating activities (puzzles, reading, adult coloring books, staying active) to keep memory sharp.   Continue to eat heart healthy diet (full of fruits, vegetables, whole grains, lean protein, water--limit salt, fat, and sugar intake) and increase physical activity as tolerated.   [name] , Thank you for taking time to come for your Medicare Wellness Visit. I appreciate your ongoing commitment to your health goals. Please review the following plan we discussed and let me know if I can assist you in the future.   These are the goals we discussed: Goals    . Patient Stated     I do want to monitor what I eat closer to maintain my health.     . Stay as healthy and as independent as possible     Think about down sizing and moving to independent living. Continue to enjoy reading, travel, and enjoy life.       This is a list of the screening recommended for you and due dates:  Health Maintenance  Topic Date Due  . Flu Shot  06/13/2018  . Tetanus Vaccine  10/31/2020  . Colon Cancer Screening  07/13/2026  .  Hepatitis C: One time screening is recommended by Center for Disease Control  (CDC) for  adults born from [birth year] through 1964.   Completed  . Pneumonia vaccines  Completed    Insomnia Insomnia is a sleep disorder that makes it difficult to fall asleep or to stay asleep. Insomnia can cause tiredness (fatigue), low energy, difficulty concentrating, mood swings, and poor performance at work or school. There are three different ways to classify insomnia:  Difficulty falling asleep.  Difficulty staying asleep.  Waking up too early in the morning.  Any type of insomnia can be long-term (chronic) or short-term (acute). Both are common. Short-term insomnia usually lasts for three months or less. Chronic insomnia occurs at least three times a week for longer than three months. What are the causes? Insomnia may be caused by another condition, situation, or  substance, such as:  Anxiety.  Certain medicines.  Gastroesophageal reflux disease (GERD) or other gastrointestinal conditions.  Asthma or other breathing conditions.  Restless legs syndrome, sleep apnea, or other sleep disorders.  Chronic pain.  Menopause. This may include hot flashes.  Stroke.  Abuse of alcohol, tobacco, or illegal drugs.  Depression.  Caffeine.  Neurological disorders, such as Alzheimer disease.  An overactive thyroid (hyperthyroidism).  The cause of insomnia may not be known. What increases the risk? Risk factors for insomnia include:  Gender. Women are more commonly affected than men.  Age. Insomnia is more common as you get older.  Stress. This may involve your professional or personal life.  Income. Insomnia is more common in people with lower income.  Lack of exercise.  Irregular work schedule or night shifts.  Traveling between different time zones.  What are the signs or symptoms? If you have insomnia, trouble falling asleep or trouble staying asleep is the main symptom. This may lead to other symptoms, such as:  Feeling fatigued.  Feeling nervous about going to sleep.  Not feeling rested in the morning.  Having trouble concentrating.  Feeling irritable, anxious, or depressed.  How is this treated? Treatment for insomnia depends on the cause. If your insomnia is caused by an underlying condition, treatment will focus on addressing the condition. Treatment may also include:  Medicines to help you sleep.  Counseling or therapy.  Lifestyle adjustments.  Follow these instructions at home:  Take medicines only as directed by your health care provider.  Keep regular sleeping and waking hours. Avoid naps.  Keep a sleep diary to help you and your health care provider figure out what could be causing your insomnia. Include: ? When you sleep. ? When you wake up during the night. ? How well you sleep. ? How rested you feel  the next day. ? Any side effects of medicines you are taking. ? What you eat and drink.  Make your bedroom a comfortable place where it is easy to fall asleep: ? Put up shades or special blackout curtains to block light from outside. ? Use a white noise machine to block noise. ? Keep the temperature cool.  Exercise regularly as directed by your health care provider. Avoid exercising right before bedtime.  Use relaxation techniques to manage stress. Ask your health care provider to suggest some techniques that may work well for you. These may include: ? Breathing exercises. ? Routines to release muscle tension. ? Visualizing peaceful scenes.  Cut back on alcohol, caffeinated beverages, and cigarettes, especially close to bedtime. These can disrupt your sleep.  Do not overeat or eat spicy foods right before bedtime. This can lead to digestive discomfort that can make it hard for you to sleep.  Limit screen use before bedtime. This includes: ? Watching TV. ? Using your smartphone, tablet, and computer.  Stick to a routine. This can help you fall asleep faster. Try to do a quiet activity, brush your teeth, and go to bed at the same time each night.  Get out of bed if you are still awake after 15 minutes of trying to sleep. Keep the lights down, but try reading or doing a quiet activity. When you feel sleepy, go back to bed.  Make sure that you drive carefully. Avoid driving if you feel very sleepy.  Keep all follow-up appointments as directed by your health care provider. This is important. Contact a health care provider if:  You are tired throughout the day or have trouble in your daily routine due to sleepiness.  You continue to have sleep problems or your sleep problems get worse. Get help right away if:  You have serious thoughts about hurting yourself or someone else. This information is not intended to replace advice given to you by your health care provider. Make sure you  discuss any questions you have with your health care provider. Document Released: 10/27/2000 Document Revised: 03/31/2016 Document Reviewed: 07/31/2014 Elsevier Interactive Patient Education  2018 [place] Maintenance, Male A healthy lifestyle and preventive care is important for your health and wellness. Ask your health care provider about what schedule of regular examinations is right for you. What should I know about weight and diet? Eat a Healthy Diet  Eat plenty of vegetables, fruits, whole grains, low-fat dairy products, and lean protein.  Do not eat a lot of foods high in solid fats, added sugars, or salt.  Maintain a Healthy Weight Regular exercise can help you achieve or maintain a healthy weight. You should:  Do at least 150 minutes of exercise each week. The exercise should increase your heart rate and make you sweat (moderate-intensity exercise).  Do strength-training exercises at least twice a week.  Watch Your Levels of Cholesterol and Blood Lipids  Have your blood tested for lipids and cholesterol every 5 years starting at 71 years of age. If you are at high risk for heart disease, you should start having your  blood tested when you are 71 years old. You may need to have your cholesterol levels checked more often if: ? Your lipid or cholesterol levels are high. ? You are older than 71 years of age. ? You are at high risk for heart disease.  What should I know about cancer screening? Many types of cancers can be detected early and may often be prevented. Lung Cancer  You should be screened every year for lung cancer if: ? You are a current smoker who has smoked for at least 30 years. ? You are a former smoker who has quit within the past 15 years.  Talk to your health care provider about your screening options, when you should start screening, and how often you should be screened.  Colorectal Cancer  Routine colorectal cancer screening usually  begins at 71 years of age and should be repeated every 5-10 years until you are 71 years old. You may need to be screened more often if early forms of precancerous polyps or small growths are found. Your health care provider may recommend screening at an earlier age if you have risk factors for colon cancer.  Your health care provider may recommend using home test kits to check for hidden blood in the stool.  A small camera at the end of a tube can be used to examine your colon (sigmoidoscopy or colonoscopy). This checks for the earliest forms of colorectal cancer.  Prostate and Testicular Cancer  Depending on your age and overall health, your health care provider may do certain tests to screen for prostate and testicular cancer.  Talk to your health care provider about any symptoms or concerns you have about testicular or prostate cancer.  Skin Cancer  Check your skin from head to toe regularly.  Tell your health care provider about any new moles or changes in moles, especially if: ? There is a change in a mole's size, shape, or color. ? You have a mole that is larger than a pencil eraser.  Always use sunscreen. Apply sunscreen liberally and repeat throughout the day.  Protect yourself by wearing long sleeves, pants, a wide-brimmed hat, and sunglasses when outside.  What should I know about heart disease, diabetes, and high blood pressure?  If you are 49-36 years of age, have your blood pressure checked every 3-5 years. If you are 76 years of age or older, have your blood pressure checked every year. You should have your blood pressure measured twice-once when you are at a hospital or clinic, and once when you are not at a hospital or clinic. Record the average of the two measurements. To check your blood pressure when you are not at a hospital or clinic, you can use: ? An automated blood pressure machine at a pharmacy. ? A home blood pressure monitor.  Talk to your health care  provider about your target blood pressure.  If you are between 45-71 years old, ask your health care provider if you should take aspirin to prevent heart disease.  Have regular diabetes screenings by checking your fasting blood sugar level. ? If you are at a normal weight and have a low risk for diabetes, have this test once every three years after the age of 27. ? If you are overweight and have a high risk for diabetes, consider being tested at a younger age or more often.  A one-time screening for abdominal aortic aneurysm (AAA) by ultrasound is recommended for men aged 24-75 years who are current  or former smokers. What should I know about preventing infection? Hepatitis B If you have a higher risk for hepatitis B, you should be screened for this virus. Talk with your health care provider to find out if you are at risk for hepatitis B infection. Hepatitis C Blood testing is recommended for:  Everyone born from [birth year] through 1964.  Anyone with known risk factors for hepatitis C.  Sexually Transmitted Diseases (STDs)  You should be screened each year for STDs including gonorrhea and chlamydia if: ? You are sexually active and are younger than 71 years of age. ? You are older than 71 years of age and your health care provider tells you that you are at risk for this type of infection. ? Your sexual activity has changed since you were last screened and you are at an increased risk for chlamydia or gonorrhea. Ask your health care provider if you are at risk.  Talk with your health care provider about whether you are at high risk of being infected with HIV. Your health care provider may recommend a prescription medicine to help prevent HIV infection.  What else can I do?  Schedule regular health, dental, and eye exams.  Stay current with your vaccines (immunizations).  Do not use any tobacco products, such as cigarettes, chewing tobacco, and e-cigarettes. If you need help quitting, ask  your health care provider.  Limit alcohol intake to no more than 2 drinks per day. One drink equals 12 ounces of beer, 5 ounces of wine, or 1 ounces of hard liquor.  Do not use street drugs.  Do not share needles.  Ask your health care provider for help if you need support or information about quitting drugs.  Tell your health care provider if you often feel depressed.  Tell your health care provider if you have ever been abused or do not feel safe at home. This information is not intended to replace advice given to you by your health care provider. Make sure you discuss any questions you have with your health care provider. Document Released: 04/27/2008 Document Revised: 06/28/2016 Document Reviewed: 08/03/2015 Elsevier Interactive Patient Education  [employer].

## 2018-09-18 ENCOUNTER — Encounter: Payer: Self-pay | Admitting: Vascular Surgery

## 2018-09-18 ENCOUNTER — Other Ambulatory Visit: Payer: Self-pay

## 2018-09-18 ENCOUNTER — Ambulatory Visit (INDEPENDENT_AMBULATORY_CARE_PROVIDER_SITE_OTHER): Payer: Medicare Other | Admitting: Vascular Surgery

## 2018-09-18 VITALS — BP 148/83 | HR 58 | Resp 18 | Ht 65.0 in | Wt 166.0 lb

## 2018-09-18 DIAGNOSIS — I83813 Varicose veins of bilateral lower extremities with pain: Secondary | ICD-10-CM | POA: Diagnosis not present

## 2018-09-18 NOTE — Progress Notes (Signed)
Patient is a 72 year old male who returns for follow-up today.  He was last seen June 27, 2018.  At that point he had had a recent bleed from a varicosity in his left leg.  He was prescribed long leg compression stockings.  He currently has had no further bleeding episodes.  He is quite worried about bleeding again from his left leg.  He also has varicosities that have developed in his left anterior thigh and right medial calf.  He has some swelling fullness and aching that occurs in his legs intermittently.  This is somewhat improved with diuretic and compression but not fully resolved.  Chronic medical problems include atrial fibrillation hypertension osteoarthritis all of which are currently stable.  He is on Xarelto for atrial fibrillation.  Review of systems: He denies shortness of breath.  He denies chest pain.  Past Medical History:  Diagnosis Date  . A-fib ([place]) 11/2016   Tx with po meds at this time  . Anemia    Microcytic with Fe Sat 4% 10/31/10  . Atypical chest pain   . Cataract    removed bilaterally  . Chronic rhinitis   . Cough   . Diverticulosis   . Dyspnea    " WITH MY AFIB "  . GERD (gastroesophageal reflux disease)   . Hiatal hernia   . Hiatal hernia   . History of recent blood transfusion 06/05/2018  . HLD (hyperlipidemia)   . HTN (hypertension)   . Interstitial lung disease ([place])   . Kidney stones   . Osteoarthritis   . Osteoporosis 2014  . Sarcoidosis 8/99   Transbronchial biopsy, taking daily prednisone since  November 08 on and off since 1999 w/cough every time he relapses off it for more than several weeks  . Undescended right testicle    "shrivled" in childhood from 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   Current Outpatient Medications on File Prior to Visit  Medication Sig Dispense Refill  . acetaminophen (TYLENOL) 500 MG tablet Take 500-1,000 mg by mouth every 6 (six) hours as needed (for pain from muscle spasms).    . Cholecalciferol 2000 units TABS Take 1 tablet (2,000  Units total) by mouth daily. 90 tablet 1  . Dietary Management Product (FOSTEUM PLUS) CAPS Take 1 capsule by mouth 2 (two) times daily. (Patient taking differently: Take 1 capsule by mouth daily. ) 60 capsule 11  . famotidine (PEPCID) 20 MG tablet Take 20 mg by mouth at bedtime as needed for heartburn or indigestion.     . ferrous sulfate 325 (65 FE) MG tablet Take 1 tablet (325 mg total) by mouth 3 (three) times daily with meals. 90 tablet 5  . finasteride (PROSCAR) 5 MG tablet TAKE 1 TABLET BY MOUTH ONCE DAILY 90 tablet 0  . losartan (COZAAR) 100 MG tablet TAKE 1 TABLET BY MOUTH ONCE DAILY 90 tablet 2  . metoprolol tartrate (LOPRESSOR) 25 MG tablet Take 1 tablet (25 mg total) by mouth 2 (two) times daily. 180 tablet 2  . pantoprazole (PROTONIX) 40 MG tablet Take 1 tablet (40 mg total) by mouth daily. 90 tablet 3  . rosuvastatin (CRESTOR) 10 MG tablet Take 1 tablet (10 mg total) by mouth daily. 30 tablet 1  . torsemide (DEMADEX) 20 MG tablet Take 1 tablet (20 mg total) by mouth 2 (two) times daily. (Patient taking differently: Take 20 mg by mouth daily. ) 180 tablet 3  . vitamin B-12 (CYANOCOBALAMIN) 250 MCG tablet Take 250 mcg by mouth daily.      Marland Kitchen  XARELTO 15 MG TABS tablet TAKE 1 TABLET BY MOUTH ONCE DAILY WITH SUPPER 90 tablet 0  . [DISCONTINUED] omeprazole (PRILOSEC) 20 MG capsule Take 20 mg by mouth daily.       No current facility-administered medications on file prior to visit.    Physical exam:  Vitals:   09/18/18 1513  BP: (!) 148/83  Pulse: (!) 58  Resp: 18  SpO2: 100%  Weight: 166 lb (75.3 kg)  Height: 5\' 5"  (1.651 m)    Extremities: 1-2+ dorsalis pedis pulse bilaterally.  Trace edema bilaterally  Skin: Fragile appearing skin left lateral malleolus with darkened skin from previous bleed site, engorged cluster of varicosities 7 to 8 mm diameter left anterior thigh and right medial upper calf see images below      Data: I reviewed the patient's previous duplex  ultrasound which showed reflux in the right and left greater saphenous veins with a vein diameter of 5 to 6 mm on the right 4 to 9 mm on the left  Assessment: Bilateral symptomatic varicose veins with swelling bilaterally and recent bleed in the left leg.  The patient has now been compliant with wearing compression stockings since August 15.  He wishes to have laser ablation of the greater saphenous vein for prevention of further bleeding episodes and improvement of swelling symptoms.  Plan: Laser ablation left greater saphenous vein with stab avulsions in the left anterior thigh 10-15 stabs for symptomatic relief pending insurance approval.  We would then do staged right greater saphenous laser ablation with approximately 10 stabs right medial knee following that.  Ruta Hinds, MD [facility] Office: [phone] Pager: [phone]

## 2018-09-19 ENCOUNTER — Other Ambulatory Visit: Payer: Self-pay | Admitting: *Deleted

## 2018-09-19 ENCOUNTER — Telehealth: Payer: Self-pay

## 2018-09-19 DIAGNOSIS — I83813 Varicose veins of bilateral lower extremities with pain: Secondary | ICD-10-CM

## 2018-09-19 NOTE — Telephone Encounter (Signed)
   [facility] Pre-operative Risk Assessment    Request for surgical clearance:  1. What type of surgery is being performed?  Endovenous laser ablation/ greater saphenous vein L/R   2. When is this surgery scheduled?  10/23/18    3. What type of clearance is required (medical clearance vs. Pharmacy clearance to hold med vs. Both)?  BOTH  4. Are there any medications that need to be held prior to surgery and how long? XARELTO 2 DAYS PRIOR, DAY OF, AND 2 DAYS FOLLOWING SURGERY   5. Practice name and name of physician performing surgery?  [facility]   Dr Oneida Alar   6. What is your office phone number [phone]    7.   What is your office fax number [phone]  8.   Anesthesia type (None, local, MAC, general) ?    Legrand Como  Birl Lobello 09/19/2018, 4:38 PM  _________________________________________________________________   (provider comments below)

## 2018-09-20 NOTE — Telephone Encounter (Signed)
Attempted to call patient twice but each time would pick up then stop ringing. Unable to leave VM. Preop team will need to attempt again on Monday. Knoxx Boeding PA-C

## 2018-09-20 NOTE — Telephone Encounter (Signed)
   Primary Cardiologist: Cristopher Peru, MD  Chart reviewed as part of pre-operative protocol coverage. Patient was contacted 09/20/2018 in reference to pre-operative risk assessment for pending surgery as outlined below.  Richard Blake was last seen 02/2018 by Dr. Lovena Le and recommended for 1 year follow-up. He has h/o persistent fibrillation (failed DCCV and Tikosyn) HTN, HLD, pulmonary/spinal cordsarcoid (treated with steroids, off for years), CRI (stage II-III), chronic diastolic CHF, chronic LEE (multifactorial from Severe TR, pulmonary HTN and varicose veins), dyslipidemia. Revised cardiac risk index 6.6% based on CHF, CKD.  Will route to pharm and pt will need call.  Charlie Pitter, PA-C 09/20/2018, 11:48 AM

## 2018-09-20 NOTE — Telephone Encounter (Signed)
Pt takes Xarelto for afib with CHADS2VASc score of 3 (age, diastolic CHF, HTN). Renal function is normal. SCr 1.98, CrCl 80mL/min, pt appropriately on reduced dose Xarelto. Ok to hold Xarelto as requested since pt is relatively low risk from a cardiac perspective.

## 2018-09-23 NOTE — Telephone Encounter (Signed)
Left voice mail to call back between pre-op hours.

## 2018-09-24 NOTE — Telephone Encounter (Signed)
Patient returned call. Given past medical history and time since last visit, based on ACC/AHA guidelines, Richard Blake would be at acceptable risk for the planned procedure without further cardiovascular testing.   I will route this recommendation to the requesting party via Epic fax function and remove from pre-op pool.  Please call with questions.  [place], [place] 09/24/2018, 3:00 PM

## 2018-10-03 ENCOUNTER — Encounter: Payer: Self-pay | Admitting: Internal Medicine

## 2018-10-03 ENCOUNTER — Ambulatory Visit (INDEPENDENT_AMBULATORY_CARE_PROVIDER_SITE_OTHER): Payer: Medicare Other | Admitting: Internal Medicine

## 2018-10-03 ENCOUNTER — Other Ambulatory Visit (INDEPENDENT_AMBULATORY_CARE_PROVIDER_SITE_OTHER): Payer: Medicare Other

## 2018-10-03 VITALS — BP 140/70 | HR 61 | Temp 98.1°F | Ht 65.0 in | Wt 170.8 lb

## 2018-10-03 DIAGNOSIS — I10 Essential (primary) hypertension: Secondary | ICD-10-CM

## 2018-10-03 DIAGNOSIS — I4819 Other persistent atrial fibrillation: Secondary | ICD-10-CM | POA: Diagnosis not present

## 2018-10-03 DIAGNOSIS — D500 Iron deficiency anemia secondary to blood loss (chronic): Secondary | ICD-10-CM

## 2018-10-03 DIAGNOSIS — Z23 Encounter for immunization: Secondary | ICD-10-CM | POA: Diagnosis not present

## 2018-10-03 LAB — CBC WITH DIFFERENTIAL/PLATELET
BASOS PCT: 1.4 % (ref 0.0–3.0)
Basophils Absolute: 0.1 10*3/uL (ref 0.0–0.1)
EOS ABS: 0.3 10*3/uL (ref 0.0–0.7)
Eosinophils Relative: 6.1 % — ABNORMAL HIGH (ref 0.0–5.0)
HCT: 39 % (ref 39.0–52.0)
HEMOGLOBIN: 12.5 g/dL — AB (ref 13.0–17.0)
Lymphocytes Relative: 19.8 % (ref 12.0–46.0)
Lymphs Abs: 1 10*3/uL (ref 0.7–4.0)
MCHC: 32.1 g/dL (ref 30.0–36.0)
MCV: 83.4 fl (ref 78.0–100.0)
MONO ABS: 0.6 10*3/uL (ref 0.1–1.0)
Monocytes Relative: 12.2 % — ABNORMAL HIGH (ref 3.0–12.0)
Neutro Abs: 3.1 10*3/uL (ref 1.4–7.7)
Neutrophils Relative %: 60.5 % (ref 43.0–77.0)
Platelets: 157 10*3/uL (ref 150.0–400.0)
RBC: 4.68 Mil/uL (ref 4.22–5.81)
RDW: 14.9 % (ref 11.5–15.5)
WBC: 5.1 10*3/uL (ref 4.0–10.5)

## 2018-10-03 LAB — BASIC METABOLIC PANEL
BUN: 16 mg/dL (ref 6–23)
CALCIUM: 9.6 mg/dL (ref 8.4–10.5)
CO2: 29 mEq/L (ref 19–32)
Chloride: 105 mEq/L (ref 96–112)
Creatinine, Ser: 1.58 mg/dL — ABNORMAL HIGH (ref 0.40–1.50)
GFR: 55.87 mL/min — AB (ref >60.00)
GLUCOSE: 83 mg/dL (ref 70–99)
Potassium: 4.2 mEq/L (ref 3.5–5.1)
Sodium: 143 mEq/L (ref 135–145)

## 2018-10-03 LAB — IBC PANEL
IRON: 55 ug/dL (ref 42–165)
Saturation Ratios: 10.7 % — ABNORMAL LOW (ref 20.0–50.0)
Transferrin: 368 mg/dL — ABNORMAL HIGH (ref 212.0–360.0)

## 2018-10-03 LAB — FERRITIN: FERRITIN: 11.8 ng/mL — AB (ref 22.0–322.0)

## 2018-10-03 NOTE — Patient Instructions (Signed)
Iron Deficiency Anemia, Adult Iron deficiency anemia is a condition in which the concentration of red blood cells or hemoglobin in the blood is below normal because of too little iron. Hemoglobin is a substance in red blood cells that carries oxygen to the body's tissues. When the concentration of red blood cells or hemoglobin is too low, not enough oxygen reaches these tissues. Iron deficiency anemia is usually long-lasting (chronic) and it develops over time. It may or may not cause symptoms. It is a common type of anemia. What are the causes? This condition may be caused by:  Not enough iron in the diet.  Blood loss caused by bleeding in the intestine.  Blood loss from a gastrointestinal condition like Crohn disease.  Frequent blood draws, such as from blood donation.  Abnormal absorption in the gut.  Heavy menstrual periods in women.  Cancers of the gastrointestinal system, such as colon cancer.  What are the signs or symptoms? Symptoms of this condition may include:  Fatigue.  Headache.  Pale skin, lips, and nail beds.  Poor appetite.  Weakness.  Shortness of breath.  Dizziness.  Cold hands and feet.  Fast or irregular heartbeat.  Irritability. This is more common in severe anemia.  Rapid breathing. This is more common in severe anemia.  Mild anemia may not cause any symptoms. How is this diagnosed? This condition is diagnosed based on:  Your medical history.  A physical exam.  Blood tests.  You may have additional tests to find the underlying cause of your anemia, such as:  Testing for blood in the stool (fecal occult blood test).  A procedure to see inside your colon and rectum (colonoscopy).  A procedure to see inside your esophagus and stomach (endoscopy).  A test in which cells are removed from bone marrow (bone marrow aspiration) or fluid is removed from the bone marrow to be examined (biopsy). This is rarely needed.  How is this  treated? This condition is treated by correcting the cause of your iron deficiency. Treatment may involve:  Adding iron-rich foods to your diet.  Taking iron supplements. If you are pregnant or breastfeeding, you may need to take extra iron because your normal diet usually does not provide the amount of iron that you need.  Increasing vitamin C intake. Vitamin C helps your body absorb iron. Your health care provider may recommend that you take iron supplements along with a glass of orange juice or a vitamin C supplement.  Medicines to make heavy menstrual flow lighter.  Surgery.  You may need repeat blood tests to determine whether treatment is working. Depending on the underlying cause, the anemia should be corrected within 2 months of starting treatment. If the treatment does not seem to be working, you may need more testing. Follow these instructions at home: Medicines  Take over-the-counter and prescription medicines only as told by your health care provider. This includes iron supplements and vitamins.  If you cannot tolerate taking iron supplements by mouth, talk with your health care provider about taking them through a vein (intravenously) or an injection into a muscle.  For the best iron absorption, you should take iron supplements when your stomach is empty. If you cannot tolerate them on an empty stomach, you may need to take them with food.  Do not drink milk or take antacids at the same time as your iron supplements. Milk and antacids may interfere with iron absorption.  Iron supplements can cause constipation. To prevent constipation, include fiber   in your diet as told by your health care provider. A stool softener may also be recommended. Eating and drinking  Talk with your health care provider before changing your diet. He or she may recommend that you eat foods that contain a lot of iron, such as: ? Liver. ? Low-fat (lean) beef. ? Breads and cereals that have iron  added to them (are fortified). ? Eggs. ? Dried fruit. ? Dark green, leafy vegetables.  To help your body use the iron from iron-rich foods, eat those foods at the same time as fresh fruits and vegetables that are high in vitamin C. Foods that are high in vitamin C include: ? Oranges. ? Peppers. ? Tomatoes. ? Mangoes.  Drinkenoughfluid to keep your urine clear or pale yellow. General instructions  Return to your normal activities as told by your health care provider. Ask your health care provider what activities are safe for you.  Practice good hygiene. Anemia can make you more prone to illness and infection.  Keep all follow-up visits as told by your health care provider. This is important. Contact a health care provider if:  You feel nauseous or you vomit.  You feel weak.  You have unexplained sweating.  You develop symptoms of constipation, such as: ? Having fewer than three bowel movements a week. ? Straining to have a bowel movement. ? Having stools that are hard, dry, or larger than normal. ? Feeling full or bloated. ? Pain in the lower abdomen. ? Not feeling relief after having a bowel movement. Get help right away if:  You faint. If this happens, do not drive yourself to the hospital. Call your local emergency services (911 in the [place]  You have chest pain.  You have shortness of breath that: ? Is severe. ? Gets worse with physical activity.  You have a rapid heartbeat.  You become light-headed when getting up from a sitting or lying down position. This information is not intended to replace advice given to you by your health care provider. Make sure you discuss any questions you have with your health care provider. Document Released: 10/27/2000 Document Revised: 07/19/2016 Document Reviewed: 07/19/2016 Elsevier Interactive Patient Education  2018 Elsevier Inc.  

## 2018-10-03 NOTE — Progress Notes (Signed)
Subjective:  Patient ID: Richard Blake, male    DOB: 08/31/1946  Age: 72 y.o. MRN: [id number]  CC: Hypertension and Anemia   HPI [name] presents for f/up - He has his baseline level of lower extremity edema.  He denies weight gain, palpitations, shortness of breath, DOE, dizziness, or lightheadedness.  He is compliant with the iron replacement therapy and has not noticed any sources of blood loss.  Outpatient Medications Prior to Visit  Medication Sig Dispense Refill  . acetaminophen (TYLENOL) 500 MG tablet Take 500-1,000 mg by mouth every 6 (six) hours as needed (for pain from muscle spasms).    . Cholecalciferol 2000 units TABS Take 1 tablet (2,000 Units total) by mouth daily. 90 tablet 1  . Dietary Management Product (FOSTEUM PLUS) CAPS Take 1 capsule by mouth 2 (two) times daily. (Patient taking differently: Take 1 capsule by mouth daily. ) 60 capsule 11  . famotidine (PEPCID) 20 MG tablet Take 20 mg by mouth at bedtime as needed for heartburn or indigestion.     . ferrous sulfate 325 (65 FE) MG tablet Take 1 tablet (325 mg total) by mouth 3 (three) times daily with meals. 90 tablet 5  . finasteride (PROSCAR) 5 MG tablet TAKE 1 TABLET BY MOUTH ONCE DAILY 90 tablet 0  . losartan (COZAAR) 100 MG tablet TAKE 1 TABLET BY MOUTH ONCE DAILY 90 tablet 2  . metoprolol tartrate (LOPRESSOR) 25 MG tablet Take 1 tablet (25 mg total) by mouth 2 (two) times daily. 180 tablet 2  . pantoprazole (PROTONIX) 40 MG tablet Take 1 tablet (40 mg total) by mouth daily. 90 tablet 3  . rosuvastatin (CRESTOR) 10 MG tablet Take 1 tablet (10 mg total) by mouth daily. 30 tablet 1  . torsemide (DEMADEX) 20 MG tablet Take 1 tablet (20 mg total) by mouth 2 (two) times daily. (Patient taking differently: Take 20 mg by mouth daily. ) 180 tablet 3  . vitamin B-12 (CYANOCOBALAMIN) 250 MCG tablet Take 250 mcg by mouth daily.      Alveda Reasons 15 MG TABS tablet TAKE 1 TABLET BY MOUTH ONCE DAILY WITH SUPPER 90 tablet  0   No facility-administered medications prior to visit.     ROS Review of Systems  Constitutional: Negative.  Negative for diaphoresis and fatigue.  HENT: Negative.   Eyes: Negative for visual disturbance.  Respiratory: Negative for cough, shortness of breath and wheezing.   Cardiovascular: Positive for leg swelling.  Gastrointestinal: Negative for abdominal pain, anal bleeding, blood in stool, constipation, diarrhea, nausea and vomiting.  Genitourinary: Negative.  Negative for difficulty urinating and hematuria.  Musculoskeletal: Negative.  Negative for arthralgias and myalgias.  Skin: Negative.   Neurological: Negative.  Negative for dizziness, weakness and light-headedness.  Hematological: Negative for adenopathy. Does not bruise/bleed easily.  Psychiatric/Behavioral: Negative.     Objective:  BP 140/70 (BP Location: Left Arm, Patient Position: Sitting, Cuff Size: Normal)   Pulse 61   Temp 98.1 F (36.7 C) (Oral)   Ht 5\' 5"  (1.651 m)   Wt 170 lb 12 oz (77.5 kg)   SpO2 99%   BMI 28.41 kg/m   BP Readings from Last 3 Encounters:  10/03/18 140/70  09/18/18 (!) 148/83  09/17/18 136/62    Wt Readings from Last 3 Encounters:  10/03/18 170 lb 12 oz (77.5 kg)  09/18/18 166 lb (75.3 kg)  09/17/18 170 lb (77.1 kg)    Physical Exam  Constitutional: He is oriented to person,  place, and time. No distress.  HENT:  Mouth/Throat: Oropharynx is clear and moist. No oropharyngeal exudate.  Eyes: Conjunctivae are normal. No scleral icterus.  Neck: Normal range of motion. Neck supple. No JVD present. No thyromegaly present.  Cardiovascular: An irregularly irregular rhythm present. Exam reveals no gallop.  No murmur heard. Pulmonary/Chest: Effort normal and breath sounds normal. No respiratory distress. He has no wheezes. He has no rales.  Abdominal: Soft. Bowel sounds are normal. He exhibits no mass. There is no tenderness.  Musculoskeletal: Normal range of motion. He exhibits  edema (trace LE pitting edema). He exhibits no tenderness or deformity.  Lymphadenopathy:    He has no cervical adenopathy.  Neurological: He is alert and oriented to person, place, and time.  Skin: Skin is warm and dry. No rash noted. He is not diaphoretic.  Vitals reviewed.   Lab Results  Component Value Date   WBC 5.1 10/03/2018   HGB 12.5 (L) 10/03/2018   HCT 39.0 10/03/2018   PLT 157.0 10/03/2018   GLUCOSE 83 10/03/2018   CHOL 115 04/22/2018   TRIG 81.0 04/22/2018   HDL 35.60 (L) 04/22/2018   LDLDIRECT 186.5 10/23/2012   LDLCALC 63 04/22/2018   ALT 15 04/22/2018   AST 15 04/22/2018   NA 143 10/03/2018   K 4.2 10/03/2018   CL 105 10/03/2018   CREATININE 1.58 (H) 10/03/2018   BUN 16 10/03/2018   CO2 29 10/03/2018   TSH 1.84 04/22/2018   PSA 1.2 04/23/2018   INR 1.1 12/14/2016    Vas [place] Lower Extremity Venous Reflux  Result Date: 06/27/2018  Lower Venous Reflux Study Indications: Pain, Palpable Cord, and varicosities.  Performing Technologist: Caralee Ates BA, RVT, RDMS Supporting Technologist: Burley Saver RVT  Examination Guidelines: A complete evaluation includes B-mode imaging, spectral Doppler, color Doppler, and power Doppler as needed of all accessible portions of each vessel. Bilateral testing is considered an integral part of a complete examination. Limited examinations for reoccurring indications may be performed as noted. The reflux portion of the exam is performed with the patient in reverse Trendelenburg.  Venous Reflux Times Normal value < 0.5 sec +------------------------------+----------+---------+                               Right (ms)Left (ms) +------------------------------+----------+---------+ CFV                                     4078.20   +------------------------------+----------+---------+ FV                            1144.20             +------------------------------+----------+---------+ Popliteal                     11004.90             +------------------------------+----------+---------+ GSV at Saphenofemoral junction4628.30   2831.20   +------------------------------+----------+---------+ GSV prox thigh                3476.70   5567.10   +------------------------------+----------+---------+ GSV mid thigh                 5127.00   5325.10   +------------------------------+----------+---------+ GSV dist thigh  5127.00   5545.10   +------------------------------+----------+---------+ GSV at knee                   3278.70             +------------------------------+----------+---------+ GSV prox calf                 5464.00             +------------------------------+----------+---------+ SSV origin                              0.20      +------------------------------+----------+---------+ SSV prox                                0.16      +------------------------------+----------+---------+ SSV mid                                 0.19      +------------------------------+----------+---------+ Vein Diameters: +------------------------------+----------+---------+                               Right (cm)Left (cm) +------------------------------+----------+---------+ GSV at Saphenofemoral junction0.61      0.86      +------------------------------+----------+---------+ GSV at prox thigh             0.66      0.35      +------------------------------+----------+---------+ GSV at mid thigh              0.55      0.39      +------------------------------+----------+---------+ GSV at distal thigh           0.57      0.27      +------------------------------+----------+---------+ GSV at knee                   0.47      0.28      +------------------------------+----------+---------+ GSV prox calf                 0.30                +------------------------------+----------+---------+ SSV origin                    0.22      0.20       +------------------------------+----------+---------+ SSV prox                      0.18      0.16      +------------------------------+----------+---------+ SSV mid                       0.13      0.19      +------------------------------+----------+---------+   Final Interpretation: Right: Abnormal reflux times were noted in the femoral vein in the thigh, popliteal vein, great saphenous vein at the saphenofemoral junction, great saphenous vein at the proximal thigh, great saphenous vein at the mid thigh, great saphenous vein at the distal thigh, great saphenous vein at the knee, and great saphenous vein at the prox calf. There is no evidence of deep vein thrombosis in the lower extremity in the CFV, FV, and POPV. Unable to distinguish GSV from varicosities in mid right calf  Left: Abnormal reflux times were noted in the common femoral vein, great saphenous vein at the saphenofemoral junction, great saphenous vein at the proximal thigh, great saphenous vein at the mid thigh, and great saphenous vein at the distal thigh. There  is no evidence of deep vein thrombosis in the lower extremity in the CFV, FV and POPV. Unable to distinguish GSV from varicosities in prox-mid left calf  *See table(s) above for measurements and observations. Electronically signed by Ruta Hinds MD on 06/27/2018 at 4:44:45 PM.    Final     Assessment & Plan:   Lorn was seen today for hypertension and anemia.  Diagnoses and all orders for this visit:  Essential hypertension- His blood pressure is adequately well controlled.  Electrolytes are normal.  Renal function is stable. -     Basic metabolic panel; Future  Iron deficiency anemia due to chronic blood loss- His H&H and iron level have improved.  I have asked him to continue to be compliant with his iron supplement. -     CBC with Differential/Platelet; Future -     IBC panel; Future -     Ferritin; Future  Need for influenza vaccination -     Flu vaccine HIGH  DOSE PF (Fluzone High dose)  Persistent atrial fibrillation- His GFR is 41.  Will continue the current dose of Xarelto.  He is asymptomatic with this.  He will continue to follow-up with his electrophysiologist to consider treatment options for permanent A. fib.   I am having Michail Sermon. Mcdonnell maintain his vitamin B-12, famotidine, acetaminophen, torsemide, ferrous sulfate, Cholecalciferol, losartan, metoprolol tartrate, pantoprazole, finasteride, XARELTO, rosuvastatin, and FOSTEUM PLUS.  No orders of the defined types were placed in this encounter.    Follow-up: Return in about 3 months (around 01/03/2019).  Scarlette Calico, MD

## 2018-10-04 ENCOUNTER — Encounter: Payer: Self-pay | Admitting: Internal Medicine

## 2018-10-23 ENCOUNTER — Ambulatory Visit: Payer: Medicare Other | Admitting: Vascular Surgery

## 2018-10-23 ENCOUNTER — Other Ambulatory Visit: Payer: Self-pay

## 2018-10-23 ENCOUNTER — Encounter: Payer: Self-pay | Admitting: Vascular Surgery

## 2018-10-23 VITALS — BP 142/77 | HR 65 | Temp 99.9°F | Resp 16 | Ht 65.0 in | Wt 172.0 lb

## 2018-10-23 DIAGNOSIS — I83812 Varicose veins of left lower extremities with pain: Secondary | ICD-10-CM

## 2018-10-23 HISTORY — PX: ENDOVENOUS ABLATION SAPHENOUS VEIN W/ LASER: SUR449

## 2018-10-23 NOTE — Progress Notes (Signed)
     Laser Ablation Procedure    Date: 10/23/2018   Richard Blake DOB:Oct 19, 1946  Consent signed: Yes    Surgeon:  Dr. Ruta Hinds   Procedure: Laser Ablation: left Greater Saphenous Vein  BP (!) 142/77 (BP Location: Left Arm, Patient Position: Sitting, Cuff Size: Normal)   Pulse 65   Temp 99.9 F (37.7 C) (Oral)   Resp 16   Ht 5\' 5"  (1.651 m)   Wt 172 lb (78 kg)   SpO2 97%   BMI 28.62 kg/m   Tumescent Anesthesia: 400 cc 0.9% NaCl with 50 cc Lidocaine HCL 1% and 15 cc 8.4% NaHCO3  Local Anesthesia: 6 cc Lidocaine HCL and NaHCO3 (ratio 2:1)  15 watts continuous mode        Total energy: 2332 Joules   Total time: 2:35  Patient tolerated procedure well  Notes: Last dose of Xarelto taken 10-20-2018.  Description of Procedure:  After marking the course of the secondary varicosities, the patient was placed on the operating table in the supine position, and the left leg was prepped and draped in sterile fashion.   Local anesthetic was administered and under ultrasound guidance the saphenous vein was accessed with a micro needle and guide wire; then the mirco puncture sheath was placed.  A guide wire was inserted saphenofemoral junction , followed by a 5 french sheath.  The position of the sheath and then the laser fiber below the junction was confirmed using the ultrasound.  Tumescent anesthesia was administered along the course of the saphenous vein using ultrasound guidance. The patient was placed in Trendelenburg position and protective laser glasses were placed on patient and staff, and the laser was fired at 15 watts continuous mode advancing 1-79mm/second for a total of 2332 joules.    Steri strips were applied to the IV insertion site and ABD pads and thigh high compression stockings were applied.  Ace wrap bandages were applied over the left thigh and at the top of the saphenofemoral junction. Blood loss was less than 15 cc.  The patient ambulated out of the operating  room having tolerated the procedure well.   Ruta Hinds, MD [facility] Office: [phone] Pager: [phone]

## 2018-10-30 ENCOUNTER — Other Ambulatory Visit: Payer: Self-pay

## 2018-10-30 ENCOUNTER — Encounter: Payer: Self-pay | Admitting: Vascular Surgery

## 2018-10-30 ENCOUNTER — Ambulatory Visit (HOSPITAL_COMMUNITY)
Admission: RE | Admit: 2018-10-30 | Discharge: 2018-10-30 | Disposition: A | Discharge status: Home / Self Care | Payer: Medicare Other | Source: Ambulatory Visit | Attending: Vascular Surgery | Admitting: Vascular Surgery

## 2018-10-30 ENCOUNTER — Ambulatory Visit (INDEPENDENT_AMBULATORY_CARE_PROVIDER_SITE_OTHER): Payer: Medicare Other | Admitting: Vascular Surgery

## 2018-10-30 VITALS — BP 147/92 | HR 57 | Temp 98.1°F | Resp 16 | Ht 65.0 in | Wt 170.0 lb

## 2018-10-30 DIAGNOSIS — I83813 Varicose veins of bilateral lower extremities with pain: Secondary | ICD-10-CM | POA: Insufficient documentation

## 2018-10-30 NOTE — Progress Notes (Signed)
Patient is a 71 year old male who returns for postoperative follow-up today after laser ablation of his left greater saphenous vein 1 week ago.  He reports minimal pain.  He still has some pressure over varicosities in his left anterior thigh.  He has some varicosities in the left medial calf as well which are bothersome to him.  Of note the right lower extremity has large ropey varicosities which also feel full heavy and achy at times.  These extend primarily around the calf area.  Images placed below.  No indication for laser ablation was bleeding from a varicosity on the dorsum of his left foot.  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  There is still flow into an anterior accessory branch.  However, these are in the neighborhood of the large cluster varicosities on his right anterior thigh and removal of these would most likely take this out of the circuit.  No evidence of DVT.            Assessment: Doing well status post successful laser ablation left greater saphenous vein.  Plan: The patient is scheduled for ablation of his right greater saphenous vein next month.  We will consider stab avulsions of the left anterior thigh and left calf in 3 months time.  We will also plan stab avulsions of the right medial calf area most likely 3 months after the laser ablation on his right leg.  Ruta Hinds, MD [facility] Office: [phone] Pager: [phone]

## 2018-11-27 ENCOUNTER — Other Ambulatory Visit (INDEPENDENT_AMBULATORY_CARE_PROVIDER_SITE_OTHER): Payer: Medicare Other

## 2018-11-27 ENCOUNTER — Ambulatory Visit (INDEPENDENT_AMBULATORY_CARE_PROVIDER_SITE_OTHER)
Admission: RE | Admit: 2018-11-27 | Discharge: 2018-11-27 | Disposition: A | Discharge status: Home / Self Care | Payer: Medicare Other | Source: Ambulatory Visit | Attending: Internal Medicine | Admitting: Internal Medicine

## 2018-11-27 ENCOUNTER — Ambulatory Visit (INDEPENDENT_AMBULATORY_CARE_PROVIDER_SITE_OTHER): Payer: Medicare Other | Admitting: Internal Medicine

## 2018-11-27 ENCOUNTER — Encounter: Payer: Self-pay | Admitting: Internal Medicine

## 2018-11-27 VITALS — BP 132/80 | HR 78 | Temp 98.0°F | Resp 16 | Ht 65.0 in | Wt 172.0 lb

## 2018-11-27 DIAGNOSIS — D500 Iron deficiency anemia secondary to blood loss (chronic): Secondary | ICD-10-CM

## 2018-11-27 DIAGNOSIS — M19011 Primary osteoarthritis, right shoulder: Secondary | ICD-10-CM | POA: Diagnosis not present

## 2018-11-27 DIAGNOSIS — I10 Essential (primary) hypertension: Secondary | ICD-10-CM | POA: Diagnosis not present

## 2018-11-27 DIAGNOSIS — R0789 Other chest pain: Secondary | ICD-10-CM

## 2018-11-27 DIAGNOSIS — N183 Chronic kidney disease, stage 3 unspecified: Secondary | ICD-10-CM

## 2018-11-27 DIAGNOSIS — J9811 Atelectasis: Secondary | ICD-10-CM | POA: Diagnosis not present

## 2018-11-27 DIAGNOSIS — E559 Vitamin D deficiency, unspecified: Secondary | ICD-10-CM

## 2018-11-27 DIAGNOSIS — G8929 Other chronic pain: Secondary | ICD-10-CM

## 2018-11-27 DIAGNOSIS — M25511 Pain in right shoulder: Secondary | ICD-10-CM

## 2018-11-27 LAB — CBC WITH DIFFERENTIAL/PLATELET
BASOS ABS: 0.1 10*3/uL (ref 0.0–0.1)
Basophils Relative: 1.3 % (ref 0.0–3.0)
Eosinophils Absolute: 0.1 10*3/uL (ref 0.0–0.7)
Eosinophils Relative: 3.6 % (ref 0.0–5.0)
HCT: 40 % (ref 39.0–52.0)
Hemoglobin: 12.6 g/dL — ABNORMAL LOW (ref 13.0–17.0)
LYMPHS PCT: 23.3 % (ref 12.0–46.0)
Lymphs Abs: 0.9 10*3/uL (ref 0.7–4.0)
MCHC: 31.6 g/dL (ref 30.0–36.0)
MCV: 83.5 fl (ref 78.0–100.0)
Monocytes Absolute: 0.5 10*3/uL (ref 0.1–1.0)
Monocytes Relative: 11.9 % (ref 3.0–12.0)
Neutro Abs: 2.4 10*3/uL (ref 1.4–7.7)
Neutrophils Relative %: 59.9 % (ref 43.0–77.0)
Platelets: 185 10*3/uL (ref 150.0–400.0)
RBC: 4.79 Mil/uL (ref 4.22–5.81)
RDW: 17.8 % — ABNORMAL HIGH (ref 11.5–15.5)
WBC: 3.9 10*3/uL — ABNORMAL LOW (ref 4.0–10.5)

## 2018-11-27 LAB — BASIC METABOLIC PANEL
BUN: 22 mg/dL (ref 6–23)
CHLORIDE: 104 meq/L (ref 96–112)
CO2: 30 meq/L (ref 19–32)
Calcium: 9.6 mg/dL (ref 8.4–10.5)
Creatinine, Ser: 1.87 mg/dL — ABNORMAL HIGH (ref 0.40–1.50)
GFR: 45.97 mL/min — ABNORMAL LOW (ref >60.00)
Glucose, Bld: 86 mg/dL (ref 70–99)
POTASSIUM: 4.3 meq/L (ref 3.5–5.1)
Sodium: 142 mEq/L (ref 135–145)

## 2018-11-27 LAB — IBC PANEL
Iron: 97 ug/dL (ref 42–165)
Saturation Ratios: 20.3 % (ref 20.0–50.0)
Transferrin: 341 mg/dL (ref 212.0–360.0)

## 2018-11-27 LAB — FERRITIN: Ferritin: 10.6 ng/mL — ABNORMAL LOW (ref 22.0–322.0)

## 2018-11-27 LAB — VITAMIN D 25 HYDROXY (VIT D DEFICIENCY, FRACTURES): VITD: 31.5 ng/mL (ref 30.00–100.00)

## 2018-11-27 NOTE — Patient Instructions (Signed)

## 2018-11-27 NOTE — Progress Notes (Signed)
Subjective:  Patient ID: COVE HAYDON, male    DOB: 1946-10-27  Age: 73 y.o. MRN: [id number]  CC: Anemia   HPI WAYLON HERSHEY presents for f/up - He complains of chronic pain in his right shoulder and for 2 months has had worsening pain in his right shoulder, right collarbone, and upper sternum.  He describes the pain as an achy sensation.  He denies cough, shortness of breath, wheezing, rash, or hemoptysis.  He takes Tylenol for the discomfort.  Outpatient Medications Prior to Visit  Medication Sig Dispense Refill  . acetaminophen (TYLENOL) 500 MG tablet Take 500-1,000 mg by mouth every 6 (six) hours as needed (for pain from muscle spasms).    . Cholecalciferol 2000 units TABS Take 1 tablet (2,000 Units total) by mouth daily. 90 tablet 1  . Dietary Management Product (FOSTEUM PLUS) CAPS Take 1 capsule by mouth 2 (two) times daily. (Patient taking differently: Take 1 capsule by mouth daily. ) 60 capsule 11  . famotidine (PEPCID) 20 MG tablet Take 20 mg by mouth at bedtime as needed for heartburn or indigestion.     . ferrous sulfate 325 (65 FE) MG tablet Take 1 tablet (325 mg total) by mouth 3 (three) times daily with meals. 90 tablet 5  . finasteride (PROSCAR) 5 MG tablet TAKE 1 TABLET BY MOUTH ONCE DAILY 90 tablet 0  . losartan (COZAAR) 100 MG tablet TAKE 1 TABLET BY MOUTH ONCE DAILY 90 tablet 2  . metoprolol tartrate (LOPRESSOR) 25 MG tablet Take 1 tablet (25 mg total) by mouth 2 (two) times daily. 180 tablet 2  . pantoprazole (PROTONIX) 40 MG tablet Take 1 tablet (40 mg total) by mouth daily. 90 tablet 3  . rosuvastatin (CRESTOR) 10 MG tablet Take 1 tablet (10 mg total) by mouth daily. 30 tablet 1  . torsemide (DEMADEX) 20 MG tablet Take 1 tablet (20 mg total) by mouth 2 (two) times daily. (Patient taking differently: Take 20 mg by mouth daily. ) 180 tablet 3  . vitamin B-12 (CYANOCOBALAMIN) 250 MCG tablet Take 250 mcg by mouth daily.      Alveda Reasons 15 MG TABS tablet TAKE 1 TABLET  BY MOUTH ONCE DAILY WITH SUPPER 90 tablet 0   No facility-administered medications prior to visit.     ROS Review of Systems  Constitutional: Negative for diaphoresis, fatigue and unexpected weight change.  HENT: Negative.  Negative for sore throat and trouble swallowing.   Respiratory: Negative for cough, chest tightness and shortness of breath.   Cardiovascular: Negative for chest pain, palpitations and leg swelling.  Gastrointestinal: Negative for abdominal pain, blood in stool, constipation, diarrhea, nausea and vomiting.  Genitourinary: Negative.  Negative for dysuria and hematuria.  Musculoskeletal: Positive for arthralgias. Negative for back pain and neck pain.  Skin: Negative for color change and rash.  Neurological: Negative.  Negative for dizziness, weakness, light-headedness and numbness.  Hematological: Negative for adenopathy. Does not bruise/bleed easily.  Psychiatric/Behavioral: Negative.     Objective:  BP 132/80 (BP Location: Left Arm, Patient Position: Sitting, Cuff Size: Normal)   Pulse 78   Temp 98 F (36.7 C) (Oral)   Resp 16   Ht 5\' 5"  (1.651 m)   Wt 172 lb (78 kg)   SpO2 99%   BMI 28.62 kg/m   BP Readings from Last 3 Encounters:  11/27/18 132/80  10/30/18 (!) 147/92  10/23/18 (!) 142/77    Wt Readings from Last 3 Encounters:  11/27/18 172 lb (78  kg)  10/30/18 170 lb (77.1 kg)  10/23/18 172 lb (78 kg)    Physical Exam Vitals signs reviewed.  Constitutional:      Appearance: He is not ill-appearing or diaphoretic.  HENT:     Nose: Nose normal. No congestion or rhinorrhea.     Mouth/Throat:     Pharynx: Oropharynx is clear. No oropharyngeal exudate or posterior oropharyngeal erythema.  Eyes:     General: No scleral icterus.    Conjunctiva/sclera: Conjunctivae normal.  Neck:     Musculoskeletal: Normal range of motion and neck supple.  Cardiovascular:     Rate and Rhythm: Normal rate and regular rhythm.     Heart sounds: No murmur. No  friction rub. No gallop.   Pulmonary:     Effort: Pulmonary effort is normal.     Breath sounds: No wheezing, rhonchi or rales.  Abdominal:     General: Abdomen is flat. Bowel sounds are normal.     Palpations: There is no hepatomegaly, splenomegaly or mass.     Tenderness: There is no abdominal tenderness.  Musculoskeletal: Normal range of motion.        General: No swelling or tenderness.     Right shoulder: He exhibits normal range of motion, no tenderness, no bony tenderness, no swelling, no effusion, no crepitus, no deformity and no pain.       Arms:     Right lower leg: No edema.     Left lower leg: No edema.  Skin:    General: Skin is warm and dry.     Findings: No rash.  Neurological:     General: No focal deficit present.     Mental Status: He is oriented to person, place, and time. Mental status is at baseline.     Lab Results  Component Value Date   WBC 3.9 (L) 11/27/2018   HGB 12.6 (L) 11/27/2018   HCT 40.0 11/27/2018   PLT 185.0 11/27/2018   GLUCOSE 86 11/27/2018   CHOL 115 04/22/2018   TRIG 81.0 04/22/2018   HDL 35.60 (L) 04/22/2018   LDLDIRECT 186.5 10/23/2012   LDLCALC 63 04/22/2018   ALT 15 04/22/2018   AST 15 04/22/2018   NA 142 11/27/2018   K 4.3 11/27/2018   CL 104 11/27/2018   CREATININE 1.87 (H) 11/27/2018   BUN 22 11/27/2018   CO2 30 11/27/2018   TSH 1.84 04/22/2018   PSA 1.2 04/23/2018   INR 1.1 12/14/2016    Vas [place] Lower Extremity Venous Post Ablation  Result Date: 10/30/2018  Lower Venous Study Indications: Swelling, Edema, and Pain.  Risk Factors: Surgery Left great saphenous vein ablation 10/23/2018. Performing Technologist: Delorise Shiner RVT  Examination Guidelines: A complete evaluation includes B-mode imaging, spectral Doppler, color Doppler, and power Doppler as needed of all accessible portions of each vessel. Bilateral testing is considered an integral part of a complete examination. Limited examinations for reoccurring  indications may be performed as noted.  Right Venous Findings: +---+---------------+---------+-----------+----------+-------+    CompressibilityPhasicitySpontaneityPropertiesSummary +---+---------------+---------+-----------+----------+-------+ CFVFull                    Yes                          +---+---------------+---------+-----------+----------+-------+  Left Venous Findings: +-------+---------------+---------+-----------+----------------+-------+        CompressibilityPhasicitySpontaneityProperties      Summary +-------+---------------+---------+-----------+----------------+-------+ CFV    Full  Yes                                +-------+---------------+---------+-----------+----------------+-------+ SFJ    Full                    Yes                                +-------+---------------+---------+-----------+----------------+-------+ FV ProxFull                    Yes                                +-------+---------------+---------+-----------+----------------+-------+ GSV    None                               softly echogenic        +-------+---------------+---------+-----------+----------------+-------+ The great saphenous vein is non compressible from 1.7 cm distal to the saphenofemoral junction to the knee. The incompetent anterior accessory great saphenous vein is identified and is still patent.    Summary: Right: No evidence of common femoral vein obstruction. Left: There is no evidence of deep vein thrombosis in the lower extremity. Successful vein closure.  *See table(s) above for measurements and observations. Electronically signed by Ruta Hinds MD on 10/30/2018 at 2:40:41 PM.    Final    Dg Chest 2 View  Result Date: 11/28/2018 CLINICAL DATA:  Right-sided chest wall pain. EXAM: CHEST - 2 VIEW COMPARISON:  04/20/2016.  01/12/2015. FINDINGS: Cardiomegaly. No pulmonary venous congestion. Bibasilar atelectasis. Chronic  interstitial changes are noted. No acute infiltrate identified. Stable elevation left hemidiaphragm. No pleural effusion or pneumothorax. Bibasilar atelectasis IMPRESSION: 1.  Cardiomegaly.  No pulmonary venous congestion. 2. Bibasilar atelectasis. Chronic interstitial changes. Stable elevation left hemidiaphragm. Electronically Signed   By: Marcello Moores  Register   On: 11/28/2018 07:43   Dg Shoulder Right  Result Date: 11/28/2018 CLINICAL DATA:  Chronic right shoulder pain. EXAM: RIGHT SHOULDER - 2+ VIEW COMPARISON:  01/22/2013 and 11/23/2016 FINDINGS: Right shoulder is located without a fracture. Degenerative changes at the right [place] joint have minimally changed. This is minimal spurring and degenerative changes along the posterior right glenoid. Again noted is enlargement of the heart particularly the right side of the heart. Chronic interstitial thickening or peribronchial thickening in the right upper lung. IMPRESSION: 1. No acute abnormality to the right shoulder. Mild degenerative changes at the glenohumeral joint and AC joint. 2. Chronic changes in the right lung and cardiomegaly. Electronically Signed   By: Markus Daft M.D.   On: 11/28/2018 07:45     Assessment & Plan:   Wetzel was seen today for anemia.  Diagnoses and all orders for this visit:  Iron deficiency anemia due to chronic blood loss- His H&H and iron level have improved.  I have asked him to continue taking the current iron supplement. -     CBC with Differential/Platelet; Future -     IBC panel; Future -     Ferritin; Future  Essential hypertension- His blood pressure is adequately well controlled. -     Basic metabolic panel; Future  Vitamin D deficiency -     VITAMIN D 25 Hydroxy (Vit-D Deficiency, Fractures); Future  Chronic right shoulder pain-  X-rays show minor degenerative changes.  He will continue taking Tylenol as needed. -     DG Shoulder Right; Future  Right-sided chest wall pain- The pain is consistent with  degenerative changes.  He will continue taking Tylenol as needed.  I have offered him reassurance. -     DG Chest 2 View; Future  CRI (chronic renal insufficiency), stage 3 (moderate) (HCC)- His renal function has declined.  I have asked him to see nephrology to consider treatment options. -     Ambulatory referral to Nephrology   I am having Michail Sermon. Kleve maintain his vitamin B-12, famotidine, acetaminophen, torsemide, ferrous sulfate, Cholecalciferol, losartan, metoprolol tartrate, pantoprazole, finasteride, XARELTO, rosuvastatin, and FOSTEUM PLUS.  No orders of the defined types were placed in this encounter.    Follow-up: Return in about 3 months (around 02/26/2019).  Scarlette Calico, MD

## 2018-11-28 ENCOUNTER — Encounter: Payer: Self-pay | Admitting: Internal Medicine

## 2018-11-28 DIAGNOSIS — N183 Chronic kidney disease, stage 3 unspecified: Secondary | ICD-10-CM | POA: Insufficient documentation

## 2018-12-04 ENCOUNTER — Encounter: Payer: Self-pay | Admitting: Vascular Surgery

## 2018-12-04 ENCOUNTER — Other Ambulatory Visit: Payer: Self-pay

## 2018-12-04 ENCOUNTER — Ambulatory Visit (INDEPENDENT_AMBULATORY_CARE_PROVIDER_SITE_OTHER): Payer: Medicare Other | Admitting: Vascular Surgery

## 2018-12-04 VITALS — BP 153/91 | HR 59 | Temp 97.9°F | Resp 18 | Ht 65.0 in | Wt 172.0 lb

## 2018-12-04 DIAGNOSIS — I83811 Varicose veins of right lower extremities with pain: Secondary | ICD-10-CM

## 2018-12-04 HISTORY — PX: ENDOVENOUS ABLATION SAPHENOUS VEIN W/ LASER: SUR449

## 2018-12-04 NOTE — Progress Notes (Signed)
     Laser Ablation Procedure    Date: 12/04/2018   Richard Blake DOB:13-Aug-1946  Consent signed: Yes    Surgeon:  Dr. Ruta Hinds MD   Procedure: Laser Ablation: right Greater Saphenous Vein  BP (!) 153/91 (BP Location: Left Arm, Patient Position: Sitting, Cuff Size: Normal)   Pulse (!) 59   Temp 97.9 F (36.6 C) (Oral)   Resp 18   Ht 5\' 5"  (1.651 m)   Wt 172 lb (78 kg)   SpO2 98%   BMI 28.62 kg/m   Tumescent Anesthesia: 300 cc 0.9% NaCl with 50 cc Lidocaine HCL 1% and 15 cc 8.4% NaHCO3  Local Anesthesia: 4 cc Lidocaine HCL and NaHCO3 (ratio 2:1)  15 watts continuous mode        Total energy: 1924 Joules   Total time: 2:07    Patient tolerated procedure well  Notes: Last dose of Xarelto taken 12-01-2018  Description of Procedure:  After marking the course of the secondary varicosities, the patient was placed on the operating table in the supine position, and the right leg was prepped and draped in sterile fashion.   Local anesthetic was administered and under ultrasound guidance the saphenous vein was accessed with a micro needle and guide wire; then the mirco puncture sheath was placed.  A guide wire was inserted saphenofemoral junction , followed by a 5 french sheath.  The position of the sheath and then the laser fiber below the junction was confirmed using the ultrasound.  Tumescent anesthesia was administered along the course of the saphenous vein using ultrasound guidance. The patient was placed in Trendelenburg position and protective laser glasses were placed on patient and staff, and the laser was fired at 15 watts continuous mode advancing 1-72mm/second for a total of 1924 joules.    Steri strips were applied to the IV insertion site and ABD pads and thigh high compression stockings were applied.  Ace wrap bandages were applied over the right thigh  and at the top of the saphenofemoral junction. Blood loss was less than 15 cc.  The patient ambulated out of the  operating room having tolerated the procedure well.  Ruta Hinds, MD [facility] Office: [phone] Pager: [phone]

## 2018-12-10 ENCOUNTER — Encounter: Payer: Self-pay | Admitting: Vascular Surgery

## 2018-12-11 ENCOUNTER — Encounter: Payer: Self-pay | Admitting: Vascular Surgery

## 2018-12-11 ENCOUNTER — Ambulatory Visit (HOSPITAL_COMMUNITY)
Admission: RE | Admit: 2018-12-11 | Discharge: 2018-12-11 | Disposition: A | Discharge status: Home / Self Care | Payer: Medicare Other | Source: Ambulatory Visit | Attending: Vascular Surgery | Admitting: Vascular Surgery

## 2018-12-11 ENCOUNTER — Other Ambulatory Visit: Payer: Self-pay

## 2018-12-11 ENCOUNTER — Ambulatory Visit (INDEPENDENT_AMBULATORY_CARE_PROVIDER_SITE_OTHER): Payer: Self-pay | Admitting: Vascular Surgery

## 2018-12-11 VITALS — BP 154/87 | HR 54 | Temp 97.8°F | Resp 16 | Ht 65.0 in | Wt 172.0 lb

## 2018-12-11 DIAGNOSIS — I83813 Varicose veins of bilateral lower extremities with pain: Secondary | ICD-10-CM

## 2018-12-11 NOTE — Progress Notes (Signed)
Patient is a 73 year old male who returns for follow-up today.  He has now undergone venous ablation of his left and right greater saphenous veins.  He still has a cluster of varicosities on the medial aspect of his right calf which have not really decompressed.  Otherwise he feels well.  He has restarted his Xarelto.  Physical exam:  Vitals:   12/11/18 1023  BP: (!) 154/87  Pulse: (!) 54  Resp: 16  Temp: 97.8 F (36.6 C)  TempSrc: Oral  SpO2: 99%  Weight: 172 lb (78 kg)  Height: 5\' 5"  (1.651 m)    Right lower extremity: Well-healed incision mild ecchymosis and induration right medial thigh no erythema, cluster of varicosities right medial calf just below the knee 7 mm diameter nontender  Data: Patient had a duplex ultrasound today which has thrombus extending up to but not into the saphenofemoral junction.  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  Patient is on chronic long-term anticoagulation with Xarelto.  I do not believe he needs any other further intervention.  Plan: The patient will follow-up with [place] in 3 months time for consideration of stab avulsions in the lower extremities.  Continue to wear his compression stockings.  Ruta Hinds, MD [facility] Office: [phone] Pager: [phone]

## 2019-01-02 ENCOUNTER — Other Ambulatory Visit: Payer: Self-pay | Admitting: Internal Medicine

## 2019-01-02 DIAGNOSIS — I4819 Other persistent atrial fibrillation: Secondary | ICD-10-CM

## 2019-01-09 DIAGNOSIS — M9903 Segmental and somatic dysfunction of lumbar region: Secondary | ICD-10-CM | POA: Diagnosis not present

## 2019-01-09 DIAGNOSIS — M5136 Other intervertebral disc degeneration, lumbar region: Secondary | ICD-10-CM | POA: Diagnosis not present

## 2019-01-09 DIAGNOSIS — M9904 Segmental and somatic dysfunction of sacral region: Secondary | ICD-10-CM | POA: Diagnosis not present

## 2019-01-09 DIAGNOSIS — M9905 Segmental and somatic dysfunction of pelvic region: Secondary | ICD-10-CM | POA: Diagnosis not present

## 2019-03-05 ENCOUNTER — Telehealth: Payer: Self-pay | Admitting: Internal Medicine

## 2019-03-05 NOTE — Telephone Encounter (Signed)
Follow up    Spoke with pt about changing to a virtual visit. Pt states his smart phone does have a camera on it. Pt will do doxemity video. Phone number in the appt notes.      Virtual Visit Pre-Appointment Phone Call  "(Name), I am calling you today to discuss your upcoming appointment. We are currently trying to limit exposure to the virus that causes COVID-19 by seeing patients at home rather than in the office."  1. "What is the BEST phone number to call the day of the visit?" - include this in appointment notes  2. Do you have or have access to (through a family member/friend) a smartphone with video capability that we can use for your visit?" a. If yes - list this number in appt notes as cell (if different from BEST phone #) and list the appointment type as a VIDEO visit in appointment notes b. If no - list the appointment type as a PHONE visit in appointment notes  3. Confirm consent - "In the setting of the current Covid19 crisis, you are scheduled for a (phone or video) visit with your provider on (date) at (time).  Just as we do with many in-office visits, in order for you to participate in this visit, we must obtain consent.  If you'd like, I can send this to your mychart (if signed up) or email for you to review.  Otherwise, I can obtain your verbal consent now.  All virtual visits are billed to your insurance company just like a normal visit would be.  By agreeing to a virtual visit, we'd like you to understand that the technology does not allow for your provider to perform an examination, and thus may limit your provider's ability to fully assess your condition. If your provider identifies any concerns that need to be evaluated in person, we will make arrangements to do so.  Finally, though the technology is pretty good, we cannot assure that it will always work on either your or our end, and in the setting of a video visit, we may have to convert it to a phone-only visit.  In  either situation, we cannot ensure that we have a secure connection.  Are you willing to proceed?" STAFF: Did the patient verbally acknowledge consent to telehealth visit? Document YES/NO here: YES  4. Advise patient to be prepared - "Two hours prior to your appointment, go ahead and check your blood pressure, pulse, oxygen saturation, and your weight (if you have the equipment to check those) and write them all down. When your visit starts, your provider will ask you for this information. If you have an Apple Watch or Kardia device, please plan to have heart rate information ready on the day of your appointment. Please have a pen and paper handy nearby the day of the visit as well."  5. Give patient instructions for MyChart download to smartphone OR Doximity/Doxy.me as below if video visit (depending on what platform provider is using)  6. Inform patient they will receive a phone call 15 minutes prior to their appointment time (may be from unknown caller ID) so they should be prepared to answer    TELEPHONE CALL NOTE  Richard Blake has been deemed a candidate for a follow-up tele-health visit to limit community exposure during the Covid-19 pandemic. I spoke with the patient via phone to ensure availability of phone/video source, confirm preferred email & phone number, and discuss instructions and expectations.  I reminded Richard  [name] to be prepared with any vital sign and/or heart rhythm information that could potentially be obtained via home monitoring, at the time of his visit. I reminded Richard Blake to expect a phone call prior to his visit.  Richard Blake 03/05/2019 2:26 PM   INSTRUCTIONS FOR DOWNLOADING THE MYCHART APP TO SMARTPHONE  - The patient must first make sure to have activated MyChart and know their login information - If Apple, go to [employer] and type in MyChart in the search bar and download the app. If Android, ask patient to go to [employer] and type  in [place] in the search bar and download the app. The app is free but as with any other app downloads, their phone may require them to verify saved payment information or Apple/Android password.  - The patient will need to then log into the app with their MyChart username and password, and select [facility] as their healthcare provider to link the account. When it is time for your visit, go to the MyChart app, find appointments, and click Begin Video Visit. Be sure to Select Allow for your device to access the Microphone and Camera for your visit. You will then be connected, and your provider will be with you shortly.  **If they have any issues connecting, or need assistance please contact MyChart service desk [phone])**  **If using a computer, in order to ensure the best quality for their visit they will need to use either of the following Internet Browsers: [employer], or Google Chrome**  IF USING DOXIMITY or DOXY.ME - The patient will receive a link just prior to their visit by text.     FULL LENGTH CONSENT FOR TELE-HEALTH VISIT   I hereby voluntarily request, consent and authorize [facility] and its employed or contracted physicians, physician assistants, nurse practitioners or other licensed health care professionals (the Practitioner), to provide me with telemedicine health care services (the Services") as deemed necessary by the treating Practitioner. I acknowledge and consent to receive the Services by the Practitioner via telemedicine. I understand that the telemedicine visit will involve communicating with the Practitioner through live audiovisual communication technology and the disclosure of certain medical information by electronic transmission. I acknowledge that I have been given the opportunity to request an in-person assessment or other available alternative prior to the telemedicine visit and am voluntarily participating in the telemedicine visit.  I  understand that I have the right to withhold or withdraw my consent to the use of telemedicine in the course of my care at any time, without affecting my right to future care or treatment, and that the Practitioner or I may terminate the telemedicine visit at any time. I understand that I have the right to inspect all information obtained and/or recorded in the course of the telemedicine visit and may receive copies of available information for a reasonable fee.  I understand that some of the potential risks of receiving the Services via telemedicine include:   Delay or interruption in medical evaluation due to technological equipment failure or disruption;  Information transmitted may not be sufficient (e.g. poor resolution of images) to allow for appropriate medical decision making by the Practitioner; and/or   In rare instances, security protocols could fail, causing a breach of personal health information.  Furthermore, I acknowledge that it is my responsibility to provide information about my medical history, conditions and care that is complete and accurate to the best of my ability. I  acknowledge that Practitioner's advice, recommendations, and/or decision may be based on factors not within their control, such as incomplete or inaccurate data provided by me or distortions of diagnostic images or specimens that may result from electronic transmissions. I understand that the practice of medicine is not an exact science and that Practitioner makes no warranties or guarantees regarding treatment outcomes. I acknowledge that I will receive a copy of this consent concurrently upon execution via email to the email address I last provided but may also request a printed copy by calling the office of [facility].    I understand that my insurance will be billed for this visit.   I have read or had this consent read to me.  I understand the contents of this consent, which adequately explains the  benefits and risks of the Services being provided via telemedicine.   I have been provided ample opportunity to ask questions regarding this consent and the Services and have had my questions answered to my satisfaction.  I give my informed consent for the services to be provided through the use of telemedicine in my medical care  By participating in this telemedicine visit I agree to the above.

## 2019-03-05 NOTE — Telephone Encounter (Signed)
Pt has a smartphone, but it is an older model. Pt is also working on upgrading his webcam for his laptop. He will try to have everything updated in time for his appointment

## 2019-03-05 NOTE — Telephone Encounter (Signed)
New message   [facility] for pt to call back about appt on 04.30.20 with Dr. Lovena Le. Will offer pt Doxemity Video visit with Dr. Lovena Le if pt has a smart phone.

## 2019-03-12 ENCOUNTER — Ambulatory Visit: Payer: Medicare Other | Admitting: Vascular Surgery

## 2019-03-13 ENCOUNTER — Telehealth: Payer: Self-pay | Admitting: Internal Medicine

## 2019-03-13 ENCOUNTER — Other Ambulatory Visit: Payer: Self-pay

## 2019-03-13 ENCOUNTER — Telehealth: Payer: Medicare Other | Admitting: Internal Medicine

## 2019-03-13 NOTE — Progress Notes (Unsigned)
Electrophysiology TeleHealth Note   Due to national recommendations of social distancing due to COVID 19, an audio/video telehealth visit is felt to be most appropriate for this patient at this time.  See MyChart message from today for the patient's consent to telehealth for [facility].   Date:  03/13/2019   ID:  Richard Blake, DOB [birth year], MRN [id number]  Location: patient's home  Provider location: [address], [place]  Evaluation Performed: Follow-up visit  PCP:  Janith Lima, MD  Cardiologist:  Cristopher Peru, MD  Electrophysiologist:  Dr Lovena Le  Chief Complaint:  ***  History of Present Illness:    Richard Blake is a 73 y.o. male who presents via audio/video conferencing for a telehealth visit today.  Since last being seen in our clinic, the patient reports doing very well.  Today, he denies symptoms of palpitations, chest pain, shortness of breath,  lower extremity edema, dizziness, presyncope, or syncope.  The patient is otherwise without complaint today.  The patient denies symptoms of fevers, chills, cough, or new SOB worrisome for COVID 19.  Past Medical History:  Diagnosis Date  . A-fib ([place]) 11/2016   Tx with po meds at this time  . Anemia    Microcytic with Fe Sat 4% 10/31/10  . Atypical chest pain   . Cataract    removed bilaterally  . Chronic rhinitis   . Cough   . Diverticulosis   . Dyspnea    " WITH MY AFIB "  . GERD (gastroesophageal reflux disease)   . Hiatal hernia   . Hiatal hernia   . History of recent blood transfusion 06/05/2018  . HLD (hyperlipidemia)   . HTN (hypertension)   . Interstitial lung disease ([place])   . Kidney stones   . Osteoarthritis   . Osteoporosis 2014  . Sarcoidosis 8/99   Transbronchial biopsy, taking daily prednisone since  November 08 on and off since 1999 w/cough every time he relapses off it for more than several weeks  . 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 CARDIOVERSION N/A 12/21/2016   Procedure: CARDIOVERSION;  Surgeon: Evans Lance, MD;  Location: [facility];  Service: Cardiovascular;  Laterality: N/A;  . CARDIOVERSION N/A 01/31/2017   Procedure: CARDIOVERSION;  Surgeon: Pixie Casino, MD;  Location: [facility] ENDOSCOPY;  Service: Cardiovascular;  Laterality: N/A;  . CATARACT EXTRACTION Left 10/14/2013  . CATARACT EXTRACTION Right 11/24/2013  . CERVICAL FUSION     pt states he did not have a cervical fusion, just a biopsy   . COLONOSCOPY  01/12/2006   tics only   . ENDOVENOUS ABLATION SAPHENOUS VEIN W/ LASER Left 10/23/2018   endovenous laser ablation left greater saphenous vein by Ruta Hinds MD   . ENDOVENOUS ABLATION SAPHENOUS VEIN W/ LASER Right 12/04/2018   endovenous laser ablation right greater saphenous vein by Ruta Hinds MD   . [place]     right  . LIPOMA EXCISION Left 09/16/2004   forearm  . spinal cord biopsyOther]  1996   sarcoid    Current Outpatient Medications  Medication Sig Dispense Refill  . acetaminophen (TYLENOL) 500 MG tablet Take 500-1,000 mg by mouth every 6 (six) hours as needed (for pain from muscle spasms).    . Cholecalciferol 2000 units TABS Take 1 tablet (2,000 Units total) by mouth daily. 90 tablet 1  .  Dietary Management Product (FOSTEUM PLUS) CAPS Take 1 capsule by mouth 2 (two) times daily. (Patient taking differently: Take 1 capsule by mouth daily. ) 60 capsule 11  . famotidine (PEPCID) 20 MG tablet Take 20 mg by mouth at bedtime as needed for heartburn or indigestion.     . ferrous sulfate 325 (65 FE) MG tablet Take 1 tablet (325 mg total) by mouth 3 (three) times daily with meals. 90 tablet 5  . finasteride (PROSCAR) 5 MG tablet TAKE 1 TABLET BY MOUTH ONCE DAILY 90 tablet 0  . losartan (COZAAR) 100 MG tablet TAKE 1 TABLET BY MOUTH ONCE DAILY 90 tablet 2  . metoprolol tartrate (LOPRESSOR) 25 MG tablet Take  1 tablet (25 mg total) by mouth 2 (two) times daily. 180 tablet 2  . pantoprazole (PROTONIX) 40 MG tablet Take 1 tablet (40 mg total) by mouth daily. 90 tablet 3  . rosuvastatin (CRESTOR) 10 MG tablet Take 1 tablet (10 mg total) by mouth daily. 30 tablet 1  . torsemide (DEMADEX) 20 MG tablet Take 1 tablet (20 mg total) by mouth 2 (two) times daily. (Patient taking differently: Take 20 mg by mouth daily. ) 180 tablet 3  . vitamin B-12 (CYANOCOBALAMIN) 250 MCG tablet Take 250 mcg by mouth daily.      Alveda Reasons 15 MG TABS tablet TAKE 1 TABLET BY MOUTH ONCE DAILY WITH SUPPER 90 tablet 1   No current facility-administered medications for this visit.     Allergies:   Amlodipine; Welchol [colesevelam hcl]; Other; and Prolia [denosumab]   Social History:  The patient  reports that he has never smoked. He has never used smokeless tobacco. He reports that he does not drink alcohol or use drugs.   Family History:  The patient's *** family history includes Arthritis in his paternal grandmother; Asthma in his mother; Colon polyps in his sister; Diabetes in his mother; Heart disease in his paternal grandmother and paternal uncle; Hypertension in his father; Kidney disease in his father and sister; Multiple myeloma in his sister; Prostate cancer in his father.   ROS:  Please see the history of present illness.   All other systems are personally reviewed and negative.    Exam:    Vital Signs:  There were no vitals taken for this visit.  Well appearing, alert and conversant, regular work of breathing,  good skin color Eyes- anicteric, neuro- grossly intact, skin- no apparent rash or lesions or cyanosis, mouth- oral mucosa is pink   Labs/Other Tests and Data Reviewed:    Recent Labs: 04/22/2018: ALT 15; TSH 1.84 11/27/2018: BUN 22; Creatinine, Ser 1.87; Hemoglobin 12.6; Platelets 185.0; Potassium 4.3; Sodium 142   Wt Readings from Last 3 Encounters:  12/11/18 172 lb (78 kg)  12/04/18 172 lb (78 kg)   11/27/18 172 lb (78 kg)     Other studies personally reviewed: Additional studies/ records that were reviewed today include: ***  Review of the above records today demonstrates: as above Prior radiographs: ***  The patient presents wearable device technology report for my review today. On my review, the patient presents with Apple Watch tracings from *** . The tracings reveal ***  Last device remote is reviewed from [facility] PDF dated *** which reveals normal device function, no arrhythmias ***   ASSESSMENT & PLAN:    1.  ***   COVID 19 screen The patient denies symptoms of COVID 19 at this time.  The importance of social distancing was discussed today.  Follow-up:  ***  Next remote: ***  Current medicines are reviewed at length with the patient today.   The patient {ACTIONS; HAS/DOES NOT HAVE:19233} concerns regarding his medicines.  The following changes were made today:  {NONE DEFAULTED:18576::"none"}  Labs/ tests ordered today include: *** No orders of the defined types were placed in this encounter.    Patient Risk:  after full review of this patients clinical status, I feel that they are at moderate risk at this time.  Today, I have spent *** minutes with the patient with telehealth technology discussing *** .    Signed, Cristopher Peru, MD  03/13/2019 12:03 PM     [facility] [address] [place] [address] [phone] (office) [phone] (fax)

## 2019-03-13 NOTE — Telephone Encounter (Signed)
Outreach x 3 made by Dr. Lovena Le with no answer.

## 2019-03-13 NOTE — Telephone Encounter (Signed)
New message   Patient has a virtual visit with Dr. Lovena Le at 12:00 pm. Please call the patient with instructions.

## 2019-03-13 NOTE — Telephone Encounter (Signed)
2 outreaches made to Pt by Dr. Lovena Le.  Will try again later

## 2019-04-08 DIAGNOSIS — N183 Chronic kidney disease, stage 3 (moderate): Secondary | ICD-10-CM | POA: Diagnosis not present

## 2019-04-08 DIAGNOSIS — N390 Urinary tract infection, site not specified: Secondary | ICD-10-CM | POA: Diagnosis not present

## 2019-04-08 DIAGNOSIS — D631 Anemia in chronic kidney disease: Secondary | ICD-10-CM | POA: Diagnosis not present

## 2019-04-08 DIAGNOSIS — I480 Paroxysmal atrial fibrillation: Secondary | ICD-10-CM | POA: Diagnosis not present

## 2019-04-08 DIAGNOSIS — I129 Hypertensive chronic kidney disease with stage 1 through stage 4 chronic kidney disease, or unspecified chronic kidney disease: Secondary | ICD-10-CM | POA: Diagnosis not present

## 2019-04-08 DIAGNOSIS — D509 Iron deficiency anemia, unspecified: Secondary | ICD-10-CM | POA: Diagnosis not present

## 2019-06-18 ENCOUNTER — Other Ambulatory Visit: Payer: Self-pay

## 2019-06-18 ENCOUNTER — Ambulatory Visit (INDEPENDENT_AMBULATORY_CARE_PROVIDER_SITE_OTHER): Payer: Medicare Other | Admitting: Vascular Surgery

## 2019-06-18 ENCOUNTER — Encounter: Payer: Self-pay | Admitting: Vascular Surgery

## 2019-06-18 VITALS — BP 140/84 | HR 53 | Temp 97.9°F | Resp 14 | Ht 67.2 in | Wt 175.0 lb

## 2019-06-18 DIAGNOSIS — I83813 Varicose veins of bilateral lower extremities with pain: Secondary | ICD-10-CM | POA: Diagnosis not present

## 2019-06-18 NOTE — Progress Notes (Signed)
Patient is a 73 year old male who returns for follow-up today.  He previously underwent right and left greater saphenous laser ablation.  Right leg was December 04, 2018.  Left leg was October 23, 2018.  He returns for further follow-up today to see whether or not we should consider stab avulsions in both legs.  He states the swelling in his legs is improved.  He is also noticed decompression of several of the varicosities in his thighs.  He does not really have any complaints or heaviness aching or pain at this point.  He has no ulcerations.  He is continuing to wear his compression stockings.  Patient has chronic medical problems of atrial fibrillation hypertension and sarcoid.  These are all currently stable.  He states that he did have a slight increase in what sounds like his creatinine recently and this thought was thought to possibly to be due to ibuprofen although he thought he did not really take that much.  He is currently off ibuprofen.  He was evaluated by renal.  Past Medical History:  Diagnosis Date  . A-fib ([place]) 11/2016   Tx with po meds at this time  . Anemia    Microcytic with Fe Sat 4% 10/31/10  . Atypical chest pain   . Cataract    removed bilaterally  . Chronic rhinitis   . Cough   . Diverticulosis   . Dyspnea    " WITH MY AFIB "  . GERD (gastroesophageal reflux disease)   . Hiatal hernia   . Hiatal hernia   . History of recent blood transfusion 06/05/2018  . HLD (hyperlipidemia)   . HTN (hypertension)   . Interstitial lung disease ([place])   . Kidney stones   . Osteoarthritis   . Osteoporosis 2014  . Sarcoidosis 8/99   Transbronchial biopsy, taking daily prednisone since  November 08 on and off since 1999 w/cough every time he relapses off it for more than several weeks  . Undescended right testicle    "shrivled" in childhood from 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     Physical exam:  Vitals:   06/18/19 1326  BP: 140/84  Pulse: (!) 53  Resp: 14  Temp: 97.9 F (36.6 C)   TempSrc: Temporal  SpO2: 99%  Weight: 175 lb (79.4 kg)  Height: 5' 7.2" (1.707 m)    Extremities: Palpable varicosities right medial calf nontender, palpable varicosities left medial thigh nontender varicosities are about 4 to 7 mm diameter in both areas.  Skin: No open ulcer or wound.  Assessment: Patient doing well status post bilateral laser ablation of the greater saphenous vein.  Plan: At this point he is satisfied with his current condition and has really no significant aches or pains from his varicose veins.  He has decided to defer any further stab avulsions at this time.  The patient will follow-up with [place] on an as-needed basis.  Ruta Hinds, MD [facility] Office: [phone] Pager: [phone]

## 2019-07-07 DIAGNOSIS — M5136 Other intervertebral disc degeneration, lumbar region: Secondary | ICD-10-CM | POA: Diagnosis not present

## 2019-07-07 DIAGNOSIS — M9905 Segmental and somatic dysfunction of pelvic region: Secondary | ICD-10-CM | POA: Diagnosis not present

## 2019-07-07 DIAGNOSIS — M9904 Segmental and somatic dysfunction of sacral region: Secondary | ICD-10-CM | POA: Diagnosis not present

## 2019-07-07 DIAGNOSIS — M9903 Segmental and somatic dysfunction of lumbar region: Secondary | ICD-10-CM | POA: Diagnosis not present

## 2019-07-31 ENCOUNTER — Other Ambulatory Visit: Payer: Self-pay | Admitting: Physician Assistant

## 2019-07-31 DIAGNOSIS — I10 Essential (primary) hypertension: Secondary | ICD-10-CM

## 2019-08-01 ENCOUNTER — Other Ambulatory Visit: Payer: Self-pay | Admitting: Physician Assistant

## 2019-08-01 DIAGNOSIS — I10 Essential (primary) hypertension: Secondary | ICD-10-CM

## 2019-08-19 ENCOUNTER — Encounter: Payer: Self-pay | Admitting: Internal Medicine

## 2019-08-19 ENCOUNTER — Ambulatory Visit (INDEPENDENT_AMBULATORY_CARE_PROVIDER_SITE_OTHER): Payer: Medicare Other | Admitting: Internal Medicine

## 2019-08-19 ENCOUNTER — Other Ambulatory Visit: Payer: Self-pay

## 2019-08-19 ENCOUNTER — Other Ambulatory Visit (INDEPENDENT_AMBULATORY_CARE_PROVIDER_SITE_OTHER): Payer: Medicare Other

## 2019-08-19 VITALS — BP 150/90 | HR 66 | Temp 98.1°F | Resp 16 | Ht 67.2 in | Wt 172.0 lb

## 2019-08-19 DIAGNOSIS — N183 Chronic kidney disease, stage 3 unspecified: Secondary | ICD-10-CM

## 2019-08-19 DIAGNOSIS — D869 Sarcoidosis, unspecified: Secondary | ICD-10-CM

## 2019-08-19 DIAGNOSIS — N138 Other obstructive and reflux uropathy: Secondary | ICD-10-CM

## 2019-08-19 DIAGNOSIS — I10 Essential (primary) hypertension: Secondary | ICD-10-CM | POA: Diagnosis not present

## 2019-08-19 DIAGNOSIS — D539 Nutritional anemia, unspecified: Secondary | ICD-10-CM

## 2019-08-19 DIAGNOSIS — D500 Iron deficiency anemia secondary to blood loss (chronic): Secondary | ICD-10-CM

## 2019-08-19 DIAGNOSIS — E559 Vitamin D deficiency, unspecified: Secondary | ICD-10-CM

## 2019-08-19 DIAGNOSIS — I4819 Other persistent atrial fibrillation: Secondary | ICD-10-CM

## 2019-08-19 DIAGNOSIS — Z23 Encounter for immunization: Secondary | ICD-10-CM

## 2019-08-19 DIAGNOSIS — N401 Enlarged prostate with lower urinary tract symptoms: Secondary | ICD-10-CM | POA: Diagnosis not present

## 2019-08-19 DIAGNOSIS — E785 Hyperlipidemia, unspecified: Secondary | ICD-10-CM

## 2019-08-19 DIAGNOSIS — E782 Mixed hyperlipidemia: Secondary | ICD-10-CM

## 2019-08-19 DIAGNOSIS — M818 Other osteoporosis without current pathological fracture: Secondary | ICD-10-CM

## 2019-08-19 DIAGNOSIS — K449 Diaphragmatic hernia without obstruction or gangrene: Secondary | ICD-10-CM | POA: Diagnosis not present

## 2019-08-19 LAB — BASIC METABOLIC PANEL
BUN: 28 mg/dL — ABNORMAL HIGH (ref 6–23)
CO2: 33 mEq/L — ABNORMAL HIGH (ref 19–32)
Calcium: 9.9 mg/dL (ref 8.4–10.5)
Chloride: 98 mEq/L (ref 96–112)
Creatinine, Ser: 1.85 mg/dL — ABNORMAL HIGH (ref 0.40–1.50)
GFR: 43.71 mL/min — ABNORMAL LOW (ref >60.00)
Glucose, Bld: 80 mg/dL (ref 70–99)
Potassium: 3.9 mEq/L (ref 3.5–5.1)
Sodium: 140 mEq/L (ref 135–145)

## 2019-08-19 LAB — CBC WITH DIFFERENTIAL/PLATELET
Basophils Absolute: 0 10*3/uL (ref 0.0–0.1)
Basophils Relative: 0.9 % (ref 0.0–3.0)
Eosinophils Absolute: 0.1 10*3/uL (ref 0.0–0.7)
Eosinophils Relative: 2.3 % (ref 0.0–5.0)
HCT: 45.3 % (ref 39.0–52.0)
Hemoglobin: 14.6 g/dL (ref 13.0–17.0)
Lymphocytes Relative: 20.8 % (ref 12.0–46.0)
Lymphs Abs: 1 10*3/uL (ref 0.7–4.0)
MCHC: 32.3 g/dL (ref 30.0–36.0)
MCV: 84.8 fl (ref 78.0–100.0)
Monocytes Absolute: 0.5 10*3/uL (ref 0.1–1.0)
Monocytes Relative: 11 % (ref 3.0–12.0)
Neutro Abs: 3.1 10*3/uL (ref 1.4–7.7)
Neutrophils Relative %: 65 % (ref 43.0–77.0)
Platelets: 149 10*3/uL — ABNORMAL LOW (ref 150.0–400.0)
RBC: 5.34 Mil/uL (ref 4.22–5.81)
RDW: 16.5 % — ABNORMAL HIGH (ref 11.5–15.5)
WBC: 4.8 10*3/uL (ref 4.0–10.5)

## 2019-08-19 LAB — PSA: PSA: 3.34 ng/mL (ref 0.10–4.00)

## 2019-08-19 LAB — URINALYSIS, ROUTINE W REFLEX MICROSCOPIC
Bilirubin Urine: NEGATIVE
Hgb urine dipstick: NEGATIVE
Ketones, ur: NEGATIVE
Leukocytes,Ua: NEGATIVE
Nitrite: NEGATIVE
RBC / HPF: NONE SEEN (ref 0–?)
Specific Gravity, Urine: 1.01 (ref 1.000–1.030)
Total Protein, Urine: NEGATIVE
Urine Glucose: NEGATIVE
Urobilinogen, UA: 0.2 (ref 0.0–1.0)
pH: 6.5 (ref 5.0–8.0)

## 2019-08-19 LAB — TSH: TSH: 0.84 u[IU]/mL (ref 0.35–4.50)

## 2019-08-19 LAB — FOLATE: Folate: 7.6 ng/mL (ref >5.9)

## 2019-08-19 LAB — LIPID PANEL
Cholesterol: 217 mg/dL — ABNORMAL HIGH (ref 0–200)
HDL: 54.9 mg/dL (ref >39.00)
LDL Cholesterol: 135 mg/dL — ABNORMAL HIGH (ref 0–99)
NonHDL: 161.95
Total CHOL/HDL Ratio: 4
Triglycerides: 137 mg/dL (ref 0.0–149.0)
VLDL: 27.4 mg/dL (ref 0.0–40.0)

## 2019-08-19 LAB — IBC PANEL
Iron: 135 ug/dL (ref 42–165)
Saturation Ratios: 29.8 % (ref 20.0–50.0)
Transferrin: 324 mg/dL (ref 212.0–360.0)

## 2019-08-19 LAB — VITAMIN B12: Vitamin B-12: 559 pg/mL (ref 211–911)

## 2019-08-19 LAB — FERRITIN: Ferritin: 14.3 ng/mL — ABNORMAL LOW (ref 22.0–322.0)

## 2019-08-19 LAB — VITAMIN D 25 HYDROXY (VIT D DEFICIENCY, FRACTURES): VITD: 23.43 ng/mL — ABNORMAL LOW (ref 30.00–100.00)

## 2019-08-19 MED ORDER — PANTOPRAZOLE SODIUM 40 MG PO TBEC: 40.0000 mg | DELAYED_RELEASE_TABLET | Freq: Every day | ORAL | 1 refills | Status: DC

## 2019-08-19 MED ORDER — METOPROLOL TARTRATE 25 MG PO TABS: 25.0000 mg | ORAL_TABLET | Freq: Two times a day (BID) | ORAL | 1 refills | Status: DC

## 2019-08-19 MED ORDER — TORSEMIDE 20 MG PO TABS: 20.0000 mg | ORAL_TABLET | Freq: Every day | ORAL | 1 refills | Status: DC

## 2019-08-19 MED ORDER — FINASTERIDE 5 MG PO TABS: 5.0000 mg | ORAL_TABLET | Freq: Every day | ORAL | 1 refills | Status: DC

## 2019-08-19 MED ORDER — ZOSTER VAC RECOMB ADJUVANTED 50 MCG/0.5ML IM SUSR: 0.5000 mL | Freq: Once | INTRAMUSCULAR | 1 refills | Status: AC

## 2019-08-19 MED ORDER — RIVAROXABAN 15 MG PO TABS: ORAL_TABLET | ORAL | 1 refills | Status: DC

## 2019-08-19 MED ORDER — FOSTEUM PLUS PO CAPS: 1.0000 | ORAL_CAPSULE | Freq: Two times a day (BID) | ORAL | 11 refills | Status: AC

## 2019-08-19 MED ORDER — CHOLECALCIFEROL 50 MCG (2000 UT) PO TABS: 1.0000 | ORAL_TABLET | Freq: Every day | ORAL | 1 refills | Status: DC

## 2019-08-19 MED ORDER — ROSUVASTATIN CALCIUM 10 MG PO TABS: 10.0000 mg | ORAL_TABLET | Freq: Every day | ORAL | 1 refills | Status: DC

## 2019-08-19 NOTE — Patient Instructions (Signed)
Anemia  Anemia is a condition in which you do not have enough red blood cells or hemoglobin. Hemoglobin is a substance in red blood cells that carries oxygen. When you do not have enough red blood cells or hemoglobin (are anemic), your body cannot get enough oxygen and your organs may not work properly. As a result, you may feel very tired or have other problems. What are the causes? Common causes of anemia include:  Excessive bleeding. Anemia can be caused by excessive bleeding inside or outside the body, including bleeding from the intestine or from periods in women.  Poor nutrition.  Long-lasting (chronic) kidney, thyroid, and liver disease.  Bone marrow disorders.  Cancer and treatments for cancer.  HIV (human immunodeficiency virus) and AIDS (acquired immunodeficiency syndrome).  Treatments for HIV and AIDS.  Spleen problems.  Blood disorders.  Infections, medicines, and autoimmune disorders that destroy red blood cells. What are the signs or symptoms? Symptoms of this condition include:  Minor weakness.  Dizziness.  Headache.  Feeling heartbeats that are irregular or faster than normal (palpitations).  Shortness of breath, especially with exercise.  Paleness.  Cold sensitivity.  Indigestion.  Nausea.  Difficulty sleeping.  Difficulty concentrating. Symptoms may occur suddenly or develop slowly. If your anemia is mild, you may not have symptoms. How is this diagnosed? This condition is diagnosed based on:  Blood tests.  Your medical history.  A physical exam.  Bone marrow biopsy. Your health care provider may also check your stool (feces) for blood and may do additional testing to look for the cause of your bleeding. You may also have other tests, including:  Imaging tests, such as a CT scan or MRI.  Endoscopy.  Colonoscopy. How is this treated? Treatment for this condition depends on the cause. If you continue to lose a lot of blood, you may  need to be treated at a hospital. Treatment may include:  Taking supplements of iron, vitamin S31, or folic acid.  Taking a hormone medicine (erythropoietin) that can help to stimulate red blood cell growth.  Having a blood transfusion. This may be needed if you lose a lot of blood.  Making changes to your diet.  Having surgery to remove your spleen. Follow these instructions at home:  Take over-the-counter and prescription medicines only as told by your health care provider.  Take supplements only as told by your health care provider.  Follow any diet instructions that you were given.  Keep all follow-up visits as told by your health care provider. This is important. Contact a health care provider if:  You develop new bleeding anywhere in the body. Get help right away if:  You are very weak.  You are short of breath.  You have pain in your abdomen or chest.  You are dizzy or feel faint.  You have trouble concentrating.  You have bloody or black, tarry stools.  You vomit repeatedly or you vomit up blood. Summary  Anemia is a condition in which you do not have enough red blood cells or enough of a substance in your red blood cells that carries oxygen (hemoglobin).  Symptoms may occur suddenly or develop slowly.  If your anemia is mild, you may not have symptoms.  This condition is diagnosed with blood tests as well as a medical history and physical exam. Other tests may be needed.  Treatment for this condition depends on the cause of the anemia. This information is not intended to replace advice given to you by  your health care provider. Make sure you discuss any questions you have with your health care provider. Document Released: 12/07/2004 Document Revised: 10/12/2017 Document Reviewed: 12/01/2016 Elsevier Patient Education  2020 [employer].

## 2019-08-19 NOTE — Progress Notes (Signed)
Subjective:  Patient ID: Richard Blake, male    DOB: 02-23-1946  Age: 73 y.o. MRN: [id number]  CC: Hypertension, Anemia, and Atrial Fibrillation   HPI BRENIN MCCARLEY presents for f/up - He complains of his baseline level of fatigue but tells me his blood pressure has been well controlled.  Based on prescription refills it looks like he would have recently run out of metoprolol and demadex.  He thinks he is taking losartan but he is not sure.  He tells me he was scheduled for an atrial fibrillation follow-up with his cardiologist early this year but it was a virtual visit and he was unable to attend it.   According to prescription refills he has not recently been taking finasteride.  Outpatient Medications Prior to Visit  Medication Sig Dispense Refill   acetaminophen (TYLENOL) 500 MG tablet Take 500-1,000 mg by mouth every 6 (six) hours as needed (for pain from muscle spasms).     ferrous sulfate 325 (65 FE) MG tablet Take 1 tablet (325 mg total) by mouth 3 (three) times daily with meals. 90 tablet 5   losartan (COZAAR) 100 MG tablet TAKE 1 TABLET BY MOUTH ONCE DAILY 30 tablet 0   vitamin B-12 (CYANOCOBALAMIN) 250 MCG tablet Take 250 mcg by mouth daily.       Cholecalciferol 2000 units TABS Take 1 tablet (2,000 Units total) by mouth daily. 90 tablet 1   Dietary Management Product (FOSTEUM PLUS) CAPS Take 1 capsule by mouth 2 (two) times daily. (Patient taking differently: Take 1 capsule by mouth daily. ) 60 capsule 11   famotidine (PEPCID) 20 MG tablet Take 20 mg by mouth at bedtime as needed for heartburn or indigestion.      finasteride (PROSCAR) 5 MG tablet TAKE 1 TABLET BY MOUTH ONCE DAILY 90 tablet 0   metoprolol tartrate (LOPRESSOR) 25 MG tablet Take 1 tablet (25 mg total) by mouth 2 (two) times daily. 180 tablet 2   pantoprazole (PROTONIX) 40 MG tablet Take 1 tablet (40 mg total) by mouth daily. 90 tablet 3   rosuvastatin (CRESTOR) 10 MG tablet Take 1 tablet (10 mg  total) by mouth daily. 30 tablet 1   torsemide (DEMADEX) 20 MG tablet Take 1 tablet (20 mg total) by mouth 2 (two) times daily. (Patient taking differently: Take 20 mg by mouth daily. ) 180 tablet 3   XARELTO 15 MG TABS tablet TAKE 1 TABLET BY MOUTH ONCE DAILY WITH SUPPER 90 tablet 1   No facility-administered medications prior to visit.     ROS Review of Systems  Constitutional: Positive for fatigue. Negative for diaphoresis and unexpected weight change.  HENT: Negative.   Respiratory: Negative for cough, chest tightness, shortness of breath and wheezing.   Cardiovascular: Negative for chest pain, palpitations and leg swelling.  Gastrointestinal: Negative for abdominal pain, constipation, diarrhea, nausea and vomiting.  Endocrine: Negative.   Genitourinary: Negative.  Negative for difficulty urinating, hematuria and urgency.  Musculoskeletal: Negative.  Negative for arthralgias and myalgias.  Skin: Negative for color change and pallor.  Neurological: Negative for dizziness, speech difficulty, weakness, light-headedness and numbness.  Hematological: Negative for adenopathy. Does not bruise/bleed easily.  Psychiatric/Behavioral: Negative.     Objective:  BP (!) 150/90 (BP Location: Left Arm, Patient Position: Sitting, Cuff Size: Normal)    Pulse 66    Temp 98.1 F (36.7 C) (Oral)    Resp 16    Ht 5' 7.2" (1.707 m)    Wt 172 lb (  78 kg)    SpO2 99%    BMI 26.78 kg/m   BP Readings from Last 3 Encounters:  08/19/19 (!) 150/90  06/18/19 140/84  12/11/18 (!) 154/87    Wt Readings from Last 3 Encounters:  08/19/19 172 lb (78 kg)  06/18/19 175 lb (79.4 kg)  12/11/18 172 lb (78 kg)    Physical Exam Constitutional:      Appearance: Normal appearance.  HENT:     Mouth/Throat:     Mouth: Mucous membranes are moist.  Eyes:     General: No scleral icterus.    Conjunctiva/sclera: Conjunctivae normal.  Neck:     Musculoskeletal: Normal range of motion.  Cardiovascular:     Rate  and Rhythm: Normal rate. Rhythm irregularly irregular.     Heart sounds: No murmur.  Pulmonary:     Effort: Pulmonary effort is normal.     Breath sounds: No stridor. No wheezing, rhonchi or rales.  Abdominal:     General: Abdomen is flat. Bowel sounds are normal. There is no distension.     Palpations: There is no hepatomegaly or splenomegaly.     Tenderness: There is no abdominal tenderness.     Hernia: There is no hernia in the left inguinal area or right inguinal area.  Genitourinary:    Pubic Area: No rash.      Penis: Normal and circumcised. No discharge, swelling or lesions.      Scrotum/Testes:        Right: Mass or tenderness not present.        Left: Mass or tenderness not present.     Epididymis:     Right: Normal. Not enlarged. No mass.     Left: Normal. Not enlarged. No mass.     Prostate: Enlarged (2+ BPH). Not tender and no nodules present.     Rectum: Normal. Guaiac result negative. No mass, tenderness, anal fissure, external hemorrhoid or internal hemorrhoid. Normal anal tone.  Musculoskeletal: Normal range of motion.     Right lower leg: No edema.     Left lower leg: No edema.  Lymphadenopathy:     Cervical: No cervical adenopathy.     Lower Body: No right inguinal adenopathy. No left inguinal adenopathy.  Skin:    General: Skin is warm and dry.  Neurological:     General: No focal deficit present.     Mental Status: He is alert.  Psychiatric:        Mood and Affect: Mood normal.        Behavior: Behavior normal.     Lab Results  Component Value Date   WBC 4.8 08/19/2019   HGB 14.6 08/19/2019   HCT 45.3 08/19/2019   PLT 149.0 (L) 08/19/2019   GLUCOSE 80 08/19/2019   CHOL 217 (H) 08/19/2019   TRIG 137.0 08/19/2019   HDL 54.90 08/19/2019   LDLDIRECT 186.5 10/23/2012   LDLCALC 135 (H) 08/19/2019   ALT 15 04/22/2018   AST 15 04/22/2018   NA 140 08/19/2019   K 3.9 08/19/2019   CL 98 08/19/2019   CREATININE 1.85 (H) 08/19/2019   BUN 28 (H)  08/19/2019   CO2 33 (H) 08/19/2019   TSH 0.84 08/19/2019   PSA 3.34 08/19/2019   INR 1.1 12/14/2016    Vas [place] Lower Extremity Venous Post Ablation  Result Date: 12/11/2018  Lower Venous Study Indications: Pain.  Risk Factors: Surgery Follow up post right great saphenous vein ablation 12-04-2018. Performing Technologist: Delorise Shiner RVT  Examination Guidelines: A complete evaluation includes B-mode imaging, spectral Doppler, color Doppler, and power Doppler as needed of all accessible portions of each vessel. Bilateral testing is considered an integral part of a complete examination. Limited examinations for reoccurring indications may be performed as noted.  Right Venous Findings: +---+---------------+---------+-----------+--------------------+-------+      Compressibility Phasicity Spontaneity Properties           Summary  +---+---------------+---------+-----------+--------------------+-------+  CFV Full                      Yes                                       +---+---------------+---------+-----------+--------------------+-------+  SFJ Partial                                                             +---+---------------+---------+-----------+--------------------+-------+  GSV None                                  spongy w/compression          +---+---------------+---------+-----------+--------------------+-------+ 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  Left Venous Findings: +---+---------------+---------+-----------+----------+-------+      Compressibility Phasicity Spontaneity Properties Summary  +---+---------------+---------+-----------+----------+-------+  CFV Full                      Yes                             +---+---------------+---------+-----------+----------+-------+    Summary: Right: No evidence of common femoral vein obstruction. Successful vein closure. Left: No evidence of common femoral vein obstruction.  *See table(s) above for  measurements and observations. Electronically signed by Ruta Hinds MD on 12/11/2018 at 10:33:20 AM.    Final     Assessment & Plan:   Keiron was seen today for hypertension, anemia and atrial fibrillation.  Diagnoses and all orders for this visit:  Essential hypertension- His blood pressure is not adequately well controlled.  I have encouraged him to restart metoprolol and torsemide.  His labs are negative for secondary causes or endorgan damage. -     Basic metabolic panel; Future -     Urinalysis, Routine w reflex microscopic; Future -     metoprolol tartrate (LOPRESSOR) 25 MG tablet; Take 1 tablet (25 mg total) by mouth 2 (two) times daily. -     torsemide (DEMADEX) 20 MG tablet; Take 1 tablet (20 mg total) by mouth daily.  Persistent atrial fibrillation- He has good rate control.  Will continue anticoagulation with the DOAC.  He is due for follow-up with cardiology. -     TSH; Future -     Ambulatory referral to Cardiology -     metoprolol tartrate (LOPRESSOR) 25 MG tablet; Take 1 tablet (25 mg total) by mouth 2 (two) times daily. -     Rivaroxaban (XARELTO) 15 MG TABS tablet; TAKE 1 TABLET BY MOUTH ONCE DAILY WITH SUPPER  CRI (chronic renal insufficiency), stage 3 (moderate)- His renal function is stable.  He will avoid nephrotoxic agents. -  Basic metabolic panel; Future  BPH with obstruction/lower urinary tract symptoms- His PSA has risen but I think this is caused by his not taking finasteride.  I have asked him to restart the 5 alpha reductase inhibitor to reduce the risk of complications from BPH. -     PSA; Future -     finasteride (PROSCAR) 5 MG tablet; Take 1 tablet (5 mg total) by mouth daily.  Iron deficiency anemia due to chronic blood loss-his H&H and iron levels are normal now. -     CBC with Differential/Platelet; Future -     IBC panel; Future -     Ferritin; Future  Sarcoidosis  Vitamin D deficiency-his vitamin D level remains mildly low.  I have asked  him to be more compliant with the vitamin D supplement. -     VITAMIN D 25 Hydroxy (Vit-D Deficiency, Fractures); Future -     Cholecalciferol 50 MCG (2000 UT) TABS; Take 1 tablet (2,000 Units total) by mouth daily.  Need for influenza vaccination -     Flu Vaccine QUAD High Dose(Fluad)  Hyperlipidemia with target LDL less than 130- He has not achieved his LDL goal.  Have asked him to restart the statin. -     Lipid panel; Future -     rosuvastatin (CRESTOR) 10 MG tablet; Take 1 tablet (10 mg total) by mouth daily.  Persistent atrial fibrillation (HCC) -     TSH; Future -     Ambulatory referral to Cardiology -     metoprolol tartrate (LOPRESSOR) 25 MG tablet; Take 1 tablet (25 mg total) by mouth 2 (two) times daily. -     Rivaroxaban (XARELTO) 15 MG TABS tablet; TAKE 1 TABLET BY MOUTH ONCE DAILY WITH SUPPER  Mixed hyperlipidemia  Large hiatal hernia -     pantoprazole (PROTONIX) 40 MG tablet; Take 1 tablet (40 mg total) by mouth daily.  Other osteoporosis without current pathological fracture -     Dietary Management Product (FOSTEUM PLUS) CAPS; Take 1 capsule by mouth 2 (two) times daily.  Deficiency anemia -     Folate; Future -     Vitamin B12; Future -     Vitamin B1; Future  Other orders -     Zoster Vaccine Adjuvanted [facility]) injection; Inject 0.5 mLs into the muscle once for 1 dose.   I have discontinued Michail Sermon. Flahive's famotidine. I have changed his Xarelto to Rivaroxaban. I have also changed his torsemide, finasteride, and Cholecalciferol. Additionally, I am having him start on Zoster Vaccine Adjuvanted. Lastly, I am having him maintain his vitamin B-12, acetaminophen, ferrous sulfate, losartan, metoprolol tartrate, rosuvastatin, pantoprazole, and Fosteum Plus.  Meds ordered this encounter  Medications   metoprolol tartrate (LOPRESSOR) 25 MG tablet    Sig: Take 1 tablet (25 mg total) by mouth 2 (two) times daily.    Dispense:  180 tablet    Refill:  1    torsemide (DEMADEX) 20 MG tablet    Sig: Take 1 tablet (20 mg total) by mouth daily.    Dispense:  90 tablet    Refill:  1   finasteride (PROSCAR) 5 MG tablet    Sig: Take 1 tablet (5 mg total) by mouth daily.    Dispense:  90 tablet    Refill:  1   Rivaroxaban (XARELTO) 15 MG TABS tablet    Sig: TAKE 1 TABLET BY MOUTH ONCE DAILY WITH SUPPER    Dispense:  90 tablet  Refill:  1   rosuvastatin (CRESTOR) 10 MG tablet    Sig: Take 1 tablet (10 mg total) by mouth daily.    Dispense:  90 tablet    Refill:  1   pantoprazole (PROTONIX) 40 MG tablet    Sig: Take 1 tablet (40 mg total) by mouth daily.    Dispense:  90 tablet    Refill:  1   Dietary Management Product (FOSTEUM PLUS) CAPS    Sig: Take 1 capsule by mouth 2 (two) times daily.    Dispense:  60 capsule    Refill:  11   Cholecalciferol 50 MCG (2000 UT) TABS    Sig: Take 1 tablet (2,000 Units total) by mouth daily.    Dispense:  90 tablet    Refill:  1   Zoster Vaccine Adjuvanted [facility]) injection    Sig: Inject 0.5 mLs into the muscle once for 1 dose.    Dispense:  0.5 mL    Refill:  1     Follow-up: Return in about 4 months (around 12/20/2019).  Scarlette Calico, MD

## 2019-08-23 LAB — VITAMIN B1: Vitamin B1 (Thiamine): 9 nmol/L (ref 8–30)

## 2019-08-24 ENCOUNTER — Encounter: Payer: Self-pay | Admitting: Internal Medicine

## 2019-08-25 ENCOUNTER — Encounter: Payer: Self-pay | Admitting: Internal Medicine

## 2019-08-25 ENCOUNTER — Other Ambulatory Visit: Payer: Self-pay | Admitting: Internal Medicine

## 2019-08-25 DIAGNOSIS — E559 Vitamin D deficiency, unspecified: Secondary | ICD-10-CM

## 2019-08-25 MED ORDER — CHOLECALCIFEROL 50 MCG (2000 UT) PO TABS: 1.0000 | ORAL_TABLET | Freq: Every day | ORAL | 1 refills | Status: DC

## 2019-08-28 ENCOUNTER — Telehealth: Payer: Self-pay | Admitting: Internal Medicine

## 2019-08-28 NOTE — Telephone Encounter (Signed)
Would like to know if patient needs to be seen sooner than 10/01/19?  Received stat referral.   Pt scheduled already on 10/01/19.

## 2019-08-29 NOTE — Telephone Encounter (Signed)
Called and left message for jen/heartcare----call tamara,RN at [place] office if any further questions

## 2019-08-29 NOTE — Telephone Encounter (Signed)
No, that should be ok, given his afib problem and was stable at last visit with dr Ronnald Ramp

## 2019-08-29 NOTE — Telephone Encounter (Signed)
Routing to dr Jenny Reichmann, can you please review this request and let us know if patient needs to be scheduled sooner (dr Ronnald Ramp on vacation for one week)---please advise, thanks

## 2019-09-10 ENCOUNTER — Other Ambulatory Visit: Payer: Self-pay | Admitting: Physician Assistant

## 2019-09-10 DIAGNOSIS — I10 Essential (primary) hypertension: Secondary | ICD-10-CM

## 2019-09-22 DIAGNOSIS — I129 Hypertensive chronic kidney disease with stage 1 through stage 4 chronic kidney disease, or unspecified chronic kidney disease: Secondary | ICD-10-CM | POA: Diagnosis not present

## 2019-09-22 DIAGNOSIS — N183 Chronic kidney disease, stage 3 unspecified: Secondary | ICD-10-CM | POA: Diagnosis not present

## 2019-09-22 DIAGNOSIS — D509 Iron deficiency anemia, unspecified: Secondary | ICD-10-CM | POA: Diagnosis not present

## 2019-09-22 DIAGNOSIS — I480 Paroxysmal atrial fibrillation: Secondary | ICD-10-CM | POA: Diagnosis not present

## 2019-09-22 NOTE — Progress Notes (Addendum)
Subjective:   Richard Blake is a 73 y.o. male who presents for Medicare Annual/Subsequent preventive examination. I connected with patient by a telephone and verified that I am speaking with the correct person using two identifiers. Patient stated full name and DOB. Patient gave permission to continue with telephonic visit. Patient's location was at home and Nurse's location was at [place] office. Participants during this visit included patient and nurse.  Review of Systems:     Sleep patterns: feels rested on waking, gets up 1-2 times nightly to void and sleeps 5-6 hours nightly.    Home Safety/Smoke Alarms: Feels safe in home. Smoke alarms in place.  Living environment; residence and Firearm Safety: 1-story house/ trailer. Lives with alone, no needs for DME, good support system Seat Belt Safety/Bike Helmet: Wears seat belt.     Objective:    Vitals: There were no vitals taken for this visit.  There is no height or weight on file to calculate BMI.  Advanced Directives 09/17/2018 09/13/2017 01/29/2017 12/21/2016 07/10/2016 06/28/2016 03/08/2016  Does Patient Have a Medical Advance Directive? _0  No No  Would patient like information on creating a medical advance directive? Yes (ED - Information included in AVS) Yes (ED - Information included in AVS) No - Patient declined No - Patient declined - - No - patient declined information    Tobacco Social History   Tobacco Use  Smoking Status Never Smoker  Smokeless Tobacco Never Used     Counseling given: Not Answered  Past Medical History:  Diagnosis Date  . A-fib ([place]) 11/2016   Tx with po meds at this time  . Anemia    Microcytic with Fe Sat 4% 10/31/10  . Atypical chest pain   . Cataract    removed bilaterally  . Chronic rhinitis   . Cough   . Diverticulosis   . Dyspnea    " WITH MY AFIB "  . GERD (gastroesophageal reflux disease)   . Hiatal hernia   . Hiatal hernia   . History of recent blood transfusion  06/05/2018  . HLD (hyperlipidemia)   . HTN (hypertension)   . Interstitial lung disease ([place])   . Kidney stones   . Osteoarthritis   . Osteoporosis 2014  . Sarcoidosis 8/99   Transbronchial biopsy, taking daily prednisone since  November 08 on and off since 1999 w/cough every time he relapses off it for more than several weeks  . 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 CARDIOVERSION N/A 12/21/2016   Procedure: CARDIOVERSION;  Surgeon: Evans Lance, MD;  Location: [place];  Service: Cardiovascular;  Laterality: N/A;  . CARDIOVERSION N/A 01/31/2017   Procedure: CARDIOVERSION;  Surgeon: Pixie Casino, MD;  Location[facility] ENDOSCOPY;  Service: Cardiovascular;  Laterality: N/A;  . CATARACT EXTRACTION Left 10/14/2013  . CATARACT EXTRACTION Right 11/24/2013  . CERVICAL FUSION     pt states he did not have a cervical fusion, just a biopsy   . COLONOSCOPY  01/12/2006   tics only   . ENDOVENOUS ABLATION SAPHENOUS VEIN W/ LASER Left 10/23/2018   endovenous laser ablation left greater saphenous vein by Ruta Hinds MD   . ENDOVENOUS ABLATION SAPHENOUS VEIN W/ LASER Right 12/04/2018   endovenous laser ablation right greater saphenous vein by Ruta Hinds MD   . [place]     right  . LIPOMA EXCISION Left  09/16/2004   forearm  . spinal cord biopsyOther]  1996   sarcoid   Family History  Problem Relation Age of Onset  . Asthma Mother   . Diabetes Mother   . Arthritis Paternal Grandmother   . Heart disease Paternal Grandmother   . Hypertension Father        Fathers family most  . Prostate cancer Father   . Kidney disease Father   . Colon polyps Sister   . Multiple myeloma Sister   . Heart disease Paternal Uncle   . Kidney disease Sister   . Sarcoidosis Neg Hx   . Other Neg Hx        osteoporosis  . Colon cancer Neg Hx   . Esophageal cancer Neg Hx   . Rectal cancer Neg  Hx   . Stomach cancer Neg Hx    Social History   Socioeconomic History  . Marital status: Single    Spouse name: Not on file  . Number of children: 0  . Years of education: Not on file  . Highest education level: Not on file  Occupational History  . Occupation: Retired    Fish farm manager: [employer]  . Financial resource strain: Not very hard  . Food insecurity    Worry: Never true    Inability: Never true  . Transportation needs    Medical: No    Non-medical: No  Tobacco Use  . Smoking status: Never Smoker  . Smokeless tobacco: Never Used  Substance and Sexual Activity  . Alcohol use: No    Alcohol/week: 0.0 standard drinks  . Drug use: No  . Sexual activity: Not Currently  Lifestyle  . Physical activity    Days per week: 0 days    Minutes per session: 0 min  . Stress: Only a little  Relationships  . Social connections    Talks on phone: More than three times a week    Gets together: More than three times a week    Attends religious service: Never    Active member of club or organization: Yes    Attends meetings of clubs or organizations: 1 to 4 times per year    Relationship status: Not on file  Other Topics Concern  . Not on file  Social History Narrative   Regular Exercise -  NO    Outpatient Encounter Medications as of 09/23/2019  Medication Sig  . acetaminophen (TYLENOL) 500 MG tablet Take 500-1,000 mg by mouth every 6 (six) hours as needed (for pain from muscle spasms).  . Cholecalciferol 50 MCG (2000 UT) TABS Take 1 tablet (2,000 Units total) by mouth daily.  . Dietary Management Product (FOSTEUM PLUS) CAPS Take 1 capsule by mouth 2 (two) times daily.  . ferrous sulfate 325 (65 FE) MG tablet Take 1 tablet (325 mg total) by mouth 3 (three) times daily with meals.  . finasteride (PROSCAR) 5 MG tablet Take 1 tablet (5 mg total) by mouth daily.  Marland Kitchen losartan (COZAAR) 100 MG tablet TAKE 1 TABLET BY MOUTH EVERY DAY  . metoprolol tartrate  (LOPRESSOR) 25 MG tablet Take 1 tablet (25 mg total) by mouth 2 (two) times daily.  . pantoprazole (PROTONIX) 40 MG tablet Take 1 tablet (40 mg total) by mouth daily.  . Rivaroxaban (XARELTO) 15 MG TABS tablet TAKE 1 TABLET BY MOUTH ONCE DAILY WITH SUPPER  . rosuvastatin (CRESTOR) 10 MG tablet Take 1 tablet (10 mg total) by mouth daily.  Marland Kitchen torsemide (DEMADEX) 20  MG tablet Take 1 tablet (20 mg total) by mouth daily.  . vitamin B-12 (CYANOCOBALAMIN) 250 MCG tablet Take 250 mcg by mouth daily.    . [DISCONTINUED] omeprazole (PRILOSEC) 20 MG capsule Take 20 mg by mouth daily.     No facility-administered encounter medications on file as of 09/23/2019.     Activities of Daily Living No flowsheet data found.  Patient Care Team: Janith Lima, MD as PCP - General Lovena Le Champ Mungo, MD as PCP - Cardiology (Cardiology) Love, Alyson Locket, MD (Neurology) Tanda Rockers, MD as Attending Physician (Pulmonary Disease) Evans Lance, MD as Consulting Physician (Cardiology)   Assessment:   This is a routine wellness examination for [place]. Physical assessment deferred to PCP.  Exercise Activities and Dietary recommendations   Diet (meal preparation, eat out, water intake, caffeinated beverages, dairy products, fruits and vegetables): in general, a "healthy" diet  , well balanced   Reviewed heart healthy diet. Encouraged patient to increase daily water and healthy fluid intake.    Goals    . Patient Stated     I do want to monitor what I eat closer to maintain my health.     . Stay as healthy and as independent as possible     Think about down sizing and moving to independent living. Continue to enjoy reading, travel, and enjoy life.       Fall Risk Fall Risk  08/19/2019 09/17/2018 04/23/2018 10/18/2017 04/20/2016  Falls in the past year? 0 0 No No No  Comment - - - Emmi Telephone Survey: data to providers prior to load -  Number falls in past yr: 0 - - - -  Injury with Fall? 0 - - - -  Follow  up Falls evaluation completed - - - -   Is the patient's home free of loose throw rugs in walkways, pet beds, electrical cords, etc?   yes      Grab bars in the bathroom? yes      Handrails on the stairs?   yes      Adequate lighting?   yes  Depression Screen PHQ 2/9 Scores 08/19/2019 09/17/2018 04/23/2018 09/13/2017  PHQ - 2 Score 0 0 0 1  PHQ- 9 Score - - - 2    Cognitive Function MMSE - Mini Mental State Exam 09/13/2017  Orientation to time 5  Orientation to Place 5  Registration 3  Attention/ Calculation 5  Recall 2  Language- name 2 objects 2  Language- repeat 1  Language- follow 3 step command 3  Language- read & follow direction 1  Write a sentence 1  Copy design 1  Total score 29       Ad8 score reviewed for issues:  Issues making decisions: no  Less interest in hobbies / activities: no  Repeats questions, stories (family complaining): no  Trouble using ordinary gadgets (microwave, computer, phone):no  Forgets the month or year: no  Mismanaging finances: no  Remembering appts: no  Daily problems with thinking and/or memory: no Ad8 score is= 0  Immunization History  Administered Date(s) Administered  . Fluad Quad(high Dose 65+) 08/19/2019  . Influenza Whole 10/31/2010, 09/14/2011, 10/16/2012  . Influenza, High Dose Seasonal PF 07/20/2013, 08/28/2017, 10/03/2018  . Influenza,inj,Quad PF,6+ Mos 01/12/2015, 11/02/2015  . Influenza-Unspecified 08/22/2016  . Pneumococcal Conjugate-13 01/12/2015  . Pneumococcal Polysaccharide-23 12/14/2000, 01/19/2016  . Td 10/31/2010  . Zoster 04/20/2016   Screening Tests Health Maintenance  Topic Date Due  . TETANUS/TDAP  10/31/2020  .  COLONOSCOPY  07/13/2026  . INFLUENZA VACCINE  Completed  . Hepatitis C Screening  Completed  . PNA vac Low Risk Adult  Completed      Plan:    Reviewed health maintenance screenings with patient today and relevant education, vaccines, and/or referrals were provided.   I have  personally reviewed and noted the following in the patient's chart:   . Medical and social history . Use of alcohol, tobacco or illicit drugs  . Current medications and supplements . Functional ability and status . Nutritional status . Physical activity . Advanced directives . List of other physicians . Screenings to include cognitive, depression, and falls . Referrals and appointments  In addition, I have reviewed and discussed with patient certain preventive protocols, quality metrics, and best practice recommendations. A written personalized care plan for preventive services as well as general preventive health recommendations were provided to patient.     Michiel Cowboy, RN  09/22/2019  Medical screening examination/treatment/procedure(s) were performed by non-physician practitioner and as supervising physician I was immediately available for consultation/collaboration. I agree with above. Scarlette Calico, MD

## 2019-09-23 ENCOUNTER — Ambulatory Visit (INDEPENDENT_AMBULATORY_CARE_PROVIDER_SITE_OTHER): Payer: Medicare Other | Admitting: *Deleted

## 2019-09-23 DIAGNOSIS — Z0000 Encounter for general adult medical examination without abnormal findings: Secondary | ICD-10-CM | POA: Diagnosis not present

## 2019-10-01 ENCOUNTER — Ambulatory Visit (INDEPENDENT_AMBULATORY_CARE_PROVIDER_SITE_OTHER): Payer: Medicare Other | Admitting: Internal Medicine

## 2019-10-01 ENCOUNTER — Encounter: Payer: Self-pay | Admitting: Internal Medicine

## 2019-10-01 ENCOUNTER — Other Ambulatory Visit: Payer: Self-pay

## 2019-10-01 VITALS — BP 122/84 | HR 58 | Ht 67.0 in | Wt 176.0 lb

## 2019-10-01 DIAGNOSIS — I4819 Other persistent atrial fibrillation: Secondary | ICD-10-CM | POA: Diagnosis not present

## 2019-10-01 NOTE — Progress Notes (Signed)
HPI Mr. Helmers returns today for ongoing evaluation and management of his atrial fib. He is a pleasant 73 yo man with pulmonary sarcoid, chronic atrial fib and HTN. The patient also has dylipidemia. In the interim, he has managed to do well. He remains active. His 25 yo father passed away and he has been busy handling his estate. No syncope, chest pain or sob. He is walking. No cough. Allergies  Allergen Reactions  . Amlodipine Other (See Comments)    Edema   . Welchol [Colesevelam Hcl] Other (See Comments)    Stomach cramps  . Other Swelling    Hair dye - facial swelling   . Prolia [Denosumab] Other (See Comments)    Pain at injection sight     Current Outpatient Medications  Medication Sig Dispense Refill  . acetaminophen (TYLENOL) 500 MG tablet Take 500-1,000 mg by mouth every 6 (six) hours as needed (for pain from muscle spasms).    . Cholecalciferol 50 MCG (2000 UT) TABS Take 1 tablet (2,000 Units total) by mouth daily. 90 tablet 1  . Dietary Management Product (FOSTEUM PLUS) CAPS Take 1 capsule by mouth 2 (two) times daily. 60 capsule 11  . ferrous sulfate 325 (65 FE) MG tablet Take 1 tablet (325 mg total) by mouth 3 (three) times daily with meals. 90 tablet 5  . finasteride (PROSCAR) 5 MG tablet Take 1 tablet (5 mg total) by mouth daily. 90 tablet 1  . losartan (COZAAR) 100 MG tablet TAKE 1 TABLET BY MOUTH EVERY DAY 90 tablet 1  . metoprolol tartrate (LOPRESSOR) 25 MG tablet Take 1 tablet (25 mg total) by mouth 2 (two) times daily. 180 tablet 1  . pantoprazole (PROTONIX) 40 MG tablet Take 1 tablet (40 mg total) by mouth daily. 90 tablet 1  . Rivaroxaban (XARELTO) 15 MG TABS tablet TAKE 1 TABLET BY MOUTH ONCE DAILY WITH SUPPER 90 tablet 1  . rosuvastatin (CRESTOR) 10 MG tablet Take 1 tablet (10 mg total) by mouth daily. 90 tablet 1  . torsemide (DEMADEX) 20 MG tablet Take 1 tablet (20 mg total) by mouth daily. 90 tablet 1  . vitamin B-12 (CYANOCOBALAMIN) 250 MCG tablet  Take 250 mcg by mouth daily.       No current facility-administered medications for this visit.      Past Medical History:  Diagnosis Date  . A-fib ([place]) 11/2016   Tx with po meds at this time  . Anemia    Microcytic with Fe Sat 4% 10/31/10  . Atypical chest pain   . Cataract    removed bilaterally  . Chronic rhinitis   . Cough   . Diverticulosis   . Dyspnea    " WITH MY AFIB "  . GERD (gastroesophageal reflux disease)   . Hiatal hernia   . Hiatal hernia   . History of recent blood transfusion 06/05/2018  . HLD (hyperlipidemia)   . HTN (hypertension)   . Interstitial lung disease ([place])   . Kidney stones   . Osteoarthritis   . Osteoporosis 2014  . Sarcoidosis 8/99   Transbronchial biopsy, taking daily prednisone since  November 08 on and off since 1999 w/cough every time he relapses off it for more than several weeks  . 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   Past Surgical History:  Procedure Laterality Date  .  CARDIOVERSION N/A 12/21/2016   Procedure: CARDIOVERSION;  Surgeon: Evans Lance, MD;  Location: [place];  Service: Cardiovascular;  Laterality: N/A;  . CARDIOVERSION N/A 01/31/2017   Procedure: CARDIOVERSION;  Surgeon: Pixie Casino, MD;  Location[facility] ENDOSCOPY;  Service: Cardiovascular;  Laterality: N/A;  . CATARACT EXTRACTION Left 10/14/2013  . CATARACT EXTRACTION Right 11/24/2013  . CERVICAL FUSION     pt states he did not have a cervical fusion, just a biopsy   . COLONOSCOPY  01/12/2006   tics only   . ENDOVENOUS ABLATION SAPHENOUS VEIN W/ LASER Left 10/23/2018   endovenous laser ablation left greater saphenous vein by Ruta Hinds MD   . ENDOVENOUS ABLATION SAPHENOUS VEIN W/ LASER Right 12/04/2018   endovenous laser ablation right greater saphenous vein by Ruta Hinds MD   . [place]     right  . LIPOMA EXCISION  Left 09/16/2004   forearm  . spinal cord biopsyOther]  1996   sarcoid     Family History  Problem Relation Age of Onset  . Asthma Mother   . Diabetes Mother   . Arthritis Paternal Grandmother   . Heart disease Paternal Grandmother   . Hypertension Father        Fathers family most  . Prostate cancer Father   . Kidney disease Father   . Colon polyps Sister   . Multiple myeloma Sister   . Heart disease Paternal Uncle   . Kidney disease Sister   . Sarcoidosis Neg Hx   . Other Neg Hx        osteoporosis  . Colon cancer Neg Hx   . Esophageal cancer Neg Hx   . Rectal cancer Neg Hx   . Stomach cancer Neg Hx      Social History   Socioeconomic History  . Marital status: Single    Spouse name: Not on file  . Number of children: 0  . Years of education: Not on file  . Highest education level: Not on file  Occupational History  . Occupation: Retired    Fish farm manager: [employer]  . Financial resource strain: Not hard at all  . Food insecurity    Worry: Never true    Inability: Never true  . Transportation needs    Medical: No    Non-medical: No  Tobacco Use  . Smoking status: Never Smoker  . Smokeless tobacco: Never Used  Substance and Sexual Activity  . Alcohol use: No    Alcohol/week: 0.0 standard drinks  . Drug use: No  . Sexual activity: Not Currently  Lifestyle  . Physical activity    Days per week: 4 days    Minutes per session: 40 min  . Stress: Only a little  Relationships  . Social connections    Talks on phone: More than three times a week    Gets together: More than three times a week    Attends religious service: 1 to 4 times per year    Active member of club or organization: Yes    Attends meetings of clubs or organizations: 1 to 4 times per year    Relationship status: Not on file  . Intimate partner violence    Fear of current or ex partner: Not on file    Emotionally abused: Not on file    Physically abused: Not on file     Forced sexual activity: Not on file  Other Topics Concern  . Not on file  Social History Narrative   Regular Exercise -  NO     BP 122/84   Pulse (!) 58   Ht 5' 7" (1.702 m)   Wt 176 lb (79.8 kg)   SpO2 99%   BMI 27.57 kg/m   Physical Exam:  Well appearing NAD HEENT: Unremarkable Neck:  No JVD, no thyromegally Lymphatics:  No adenopathy Back:  No CVA tenderness Lungs:  Clear with no wheezes HEART:  Regular rate rhythm, no murmurs, no rubs, no clicks Abd:  soft, positive bowel sounds, no organomegally, no rebound, no guarding Ext:  2 plus pulses, no edema, no cyanosis, no clubbing Skin:  No rashes no nodules Neuro:  CN II through XII intact, motor grossly intact  EKG - atrial fib with a controlled VR  Assess/Plan: 1. Atrial fib - his Vr is well controlled. He will continue a strategy of rate control with his beta blocker. 2. HTN - his bp is well controlled.  3. Dyslipidemia - he will continue his crestor. 4. Sarcoid - he has no symptoms and no evidence of cardiac involvement.  Mikle Bosworth.D.

## 2019-10-01 NOTE — Patient Instructions (Signed)

## 2019-10-14 ENCOUNTER — Encounter: Payer: Self-pay | Admitting: Vascular Surgery

## 2019-11-26 ENCOUNTER — Other Ambulatory Visit: Payer: Self-pay

## 2019-11-26 DIAGNOSIS — D508 Other iron deficiency anemias: Secondary | ICD-10-CM

## 2019-11-26 MED ORDER — FERROUS SULFATE 325 (65 FE) MG PO TABS: 325.0000 mg | ORAL_TABLET | Freq: Three times a day (TID) | ORAL | 5 refills | Status: DC

## 2019-12-03 DIAGNOSIS — H43812 Vitreous degeneration, left eye: Secondary | ICD-10-CM | POA: Diagnosis not present

## 2019-12-03 DIAGNOSIS — H18593 Other hereditary corneal dystrophies, bilateral: Secondary | ICD-10-CM | POA: Diagnosis not present

## 2019-12-04 ENCOUNTER — Other Ambulatory Visit: Payer: Self-pay | Admitting: Internal Medicine

## 2019-12-04 DIAGNOSIS — D508 Other iron deficiency anemias: Secondary | ICD-10-CM

## 2019-12-04 MED ORDER — FERROUS SULFATE 325 (65 FE) MG PO TABS: 325.0000 mg | ORAL_TABLET | Freq: Three times a day (TID) | ORAL | 2 refills | Status: AC

## 2019-12-15 ENCOUNTER — Encounter: Payer: Self-pay | Admitting: Internal Medicine

## 2019-12-15 ENCOUNTER — Other Ambulatory Visit: Payer: Self-pay

## 2019-12-15 ENCOUNTER — Ambulatory Visit (INDEPENDENT_AMBULATORY_CARE_PROVIDER_SITE_OTHER): Payer: Medicare Other | Admitting: Internal Medicine

## 2019-12-15 VITALS — BP 140/78 | HR 64 | Temp 98.1°F | Resp 16 | Ht 67.0 in | Wt 182.0 lb

## 2019-12-15 DIAGNOSIS — M10072 Idiopathic gout, left ankle and foot: Secondary | ICD-10-CM | POA: Diagnosis not present

## 2019-12-15 MED ORDER — COLCHICINE 0.6 MG PO CAPS: 1.0000 | ORAL_CAPSULE | Freq: Two times a day (BID) | ORAL | 5 refills | Status: DC

## 2019-12-15 MED ORDER — METHYLPREDNISOLONE 4 MG PO TBPK: ORAL_TABLET | ORAL | 0 refills | Status: AC

## 2019-12-15 NOTE — Progress Notes (Signed)
Subjective:  Patient ID: Richard Blake, male    DOB: Aug 11, 1946  Age: 74 y.o. MRN: [id number]  CC: Foot Pain  This visit occurred during the SARS-CoV-2 public health emergency.  Safety protocols were in place, including screening questions prior to the visit, additional usage of staff PPE, and extensive cleaning of exam room while observing appropriate contact time as indicated for disinfecting solutions.    HPI Richard Blake presents for concerns about his left foot.  He complains of a 2-week history of pain, redness, swelling in his left first MTP joint.  He is not gotten much symptom relief with tart cherry juice and Tylenol.  He has a history of gout and tells me this episode feels like prior episodes of gout he had in the right foot.  Outpatient Medications Prior to Visit  Medication Sig Dispense Refill  . acetaminophen (TYLENOL) 500 MG tablet Take 500-1,000 mg by mouth every 6 (six) hours as needed (for pain from muscle spasms).    . Cholecalciferol 50 MCG (2000 UT) TABS Take 1 tablet (2,000 Units total) by mouth daily. 90 tablet 1  . Dietary Management Product (FOSTEUM PLUS) CAPS Take 1 capsule by mouth 2 (two) times daily. 60 capsule 11  . ferrous sulfate 325 (65 FE) MG tablet Take 1 tablet (325 mg total) by mouth 3 (three) times daily with meals. 90 tablet 2  . finasteride (PROSCAR) 5 MG tablet Take 1 tablet (5 mg total) by mouth daily. 90 tablet 1  . losartan (COZAAR) 100 MG tablet TAKE 1 TABLET BY MOUTH EVERY DAY 90 tablet 1  . metoprolol tartrate (LOPRESSOR) 25 MG tablet Take 1 tablet (25 mg total) by mouth 2 (two) times daily. 180 tablet 1  . pantoprazole (PROTONIX) 40 MG tablet Take 1 tablet (40 mg total) by mouth daily. 90 tablet 1  . Rivaroxaban (XARELTO) 15 MG TABS tablet TAKE 1 TABLET BY MOUTH ONCE DAILY WITH SUPPER 90 tablet 1  . rosuvastatin (CRESTOR) 10 MG tablet Take 1 tablet (10 mg total) by mouth daily. 90 tablet 1  . torsemide (DEMADEX) 20 MG tablet Take 1  tablet (20 mg total) by mouth daily. 90 tablet 1  . vitamin B-12 (CYANOCOBALAMIN) 250 MCG tablet Take 250 mcg by mouth daily.       No facility-administered medications prior to visit.    ROS Review of Systems  Constitutional: Negative for chills and fever.  HENT: Negative.   Eyes: Negative for visual disturbance.  Respiratory: Negative for cough, chest tightness, shortness of breath and wheezing.   Cardiovascular: Negative for chest pain, palpitations and leg swelling.  Gastrointestinal: Negative for abdominal pain, constipation, nausea and vomiting.  Endocrine: Negative.   Genitourinary: Negative.  Negative for difficulty urinating.  Musculoskeletal: Positive for arthralgias. Negative for gait problem and myalgias.  Skin: Positive for color change. Negative for rash.  Neurological: Negative for weakness and light-headedness.  Hematological: Negative for adenopathy. Does not bruise/bleed easily.  Psychiatric/Behavioral: Negative.     Objective:  BP 140/78   Pulse 64   Temp 98.1 F (36.7 C) (Oral)   Resp 16   Ht 5\' 7"  (1.702 m)   Wt 182 lb (82.6 kg)   SpO2 96%   BMI 28.51 kg/m   BP Readings from Last 3 Encounters:  12/15/19 140/78  10/01/19 122/84  08/19/19 (!) 150/90    Wt Readings from Last 3 Encounters:  12/15/19 182 lb (82.6 kg)  10/01/19 176 lb (79.8 kg)  08/19/19 172 lb (  78 kg)    Physical Exam Vitals reviewed.  Constitutional:      Appearance: Normal appearance.  HENT:     Nose: Nose normal.     Mouth/Throat:     Mouth: Mucous membranes are moist.  Eyes:     General: No scleral icterus.    Conjunctiva/sclera: Conjunctivae normal.  Cardiovascular:     Rate and Rhythm: Normal rate. Rhythm irregularly irregular.     Pulses:          Dorsalis pedis pulses are 1+ on the right side and 1+ on the left side.       Posterior tibial pulses are 1+ on the right side and 1+ on the left side.  Pulmonary:     Breath sounds: No stridor. No wheezing, rhonchi or  rales.  Abdominal:     General: Abdomen is flat.  Musculoskeletal:     Cervical back: Neck supple.       Legs:  Feet:     Right foot:     Skin integrity: Skin integrity normal.     Left foot:     Skin integrity: Erythema and warmth present. No skin breakdown.  Skin:    General: Skin is warm and dry.  Neurological:     General: No focal deficit present.     Mental Status: He is alert.     Lab Results  Component Value Date   WBC 4.8 08/19/2019   HGB 14.6 08/19/2019   HCT 45.3 08/19/2019   PLT 149.0 (L) 08/19/2019   GLUCOSE 80 08/19/2019   CHOL 217 (H) 08/19/2019   TRIG 137.0 08/19/2019   HDL 54.90 08/19/2019   LDLDIRECT 186.5 10/23/2012   LDLCALC 135 (H) 08/19/2019   ALT 15 04/22/2018   AST 15 04/22/2018   NA 140 08/19/2019   K 3.9 08/19/2019   CL 98 08/19/2019   CREATININE 1.85 (H) 08/19/2019   BUN 28 (H) 08/19/2019   CO2 33 (H) 08/19/2019   TSH 0.84 08/19/2019   PSA 3.34 08/19/2019   INR 1.1 12/14/2016    VAS [place] LOWER EXTREMITY VENOUS POST ABLATION  Result Date: 12/11/2018  Lower Venous Study Indications: Pain.  Risk Factors: Surgery Follow up post right great saphenous vein ablation 12-04-2018. Performing Technologist: Delorise Shiner RVT  Examination Guidelines: A complete evaluation includes B-mode imaging, spectral Doppler, color Doppler, and power Doppler as needed of all accessible portions of each vessel. Bilateral testing is considered an integral part of a complete examination. Limited examinations for reoccurring indications may be performed as noted.  Right Venous Findings: +---+---------------+---------+-----------+--------------------+-------+    CompressibilityPhasicitySpontaneityProperties          Summary +---+---------------+---------+-----------+--------------------+-------+ CFVFull                    Yes                                    +---+---------------+---------+-----------+--------------------+-------+ SFJPartial                                                         +---+---------------+---------+-----------+--------------------+-------+ GSVNone  spongy w/compression        +---+---------------+---------+-----------+--------------------+-------+ 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  Left Venous Findings: +---+---------------+---------+-----------+----------+-------+    CompressibilityPhasicitySpontaneityPropertiesSummary +---+---------------+---------+-----------+----------+-------+ CFVFull                    Yes                          +---+---------------+---------+-----------+----------+-------+    Summary: Right: No evidence of common femoral vein obstruction. Successful vein closure. Left: No evidence of common femoral vein obstruction.  *See table(s) above for measurements and observations. Electronically signed by Ruta Hinds MD on 12/11/2018 at 10:33:20 AM.    Final     Assessment & Plan:   Richard Blake was seen today for foot pain.  Diagnoses and all orders for this visit:  Acute idiopathic gout involving toe of left foot- He takes a DOAC so I recommended that he avoid NSAIDs.  Will treat this acute flare with colchicine and methylprednisolone.  I have asked him to return in about 3 weeks for me to check his uric acid level at which time I anticipate starting an xanthine oxidase inhibitor. -     Colchicine (MITIGARE) 0.6 MG CAPS; Take 1 tablet by mouth 2 (two) times daily. -     methylPREDNISolone (MEDROL DOSEPAK) 4 MG TBPK tablet; TAKE AS DIRECTED   I am having Richard Blake. Raggio start on Colchicine and methylPREDNISolone. I am also having him maintain his vitamin B-12, acetaminophen, metoprolol tartrate, torsemide, finasteride, Rivaroxaban, rosuvastatin, pantoprazole, Fosteum Plus, Cholecalciferol, losartan, and ferrous sulfate.  Meds ordered this encounter  Medications  . Colchicine (MITIGARE) 0.6 MG CAPS    Sig: Take 1  tablet by mouth 2 (two) times daily.    Dispense:  60 capsule    Refill:  5  . methylPREDNISolone (MEDROL DOSEPAK) 4 MG TBPK tablet    Sig: TAKE AS DIRECTED    Dispense:  21 tablet    Refill:  0     Follow-up: Return in about 3 weeks (around 01/05/2020).  Scarlette Calico, MD

## 2019-12-15 NOTE — Patient Instructions (Signed)
Gout  Gout is a condition that causes painful swelling of the joints. Gout is a type of inflammation of the joints (arthritis). This condition is caused by having too much uric acid in the body. Uric acid is a chemical that forms when the body breaks down substances called purines. Purines are important for building body proteins. When the body has too much uric acid, sharp crystals can form and build up inside the joints. This causes pain and swelling. Gout attacks can happen quickly and may be very painful (acute gout). Over time, the attacks can affect more joints and become more frequent (chronic gout). Gout can also cause uric acid to build up under the skin and inside the kidneys. What are the causes? This condition is caused by too much uric acid in your blood. This can happen because:  Your kidneys do not remove enough uric acid from your blood. This is the most common cause.  Your body makes too much uric acid. This can happen with some cancers and cancer treatments. It can also occur if your body is breaking down too many red blood cells (hemolytic anemia).  You eat too many foods that are high in purines. These foods include organ meats and some seafood. Alcohol, especially beer, is also high in purines. A gout attack may be triggered by trauma or stress. What increases the risk? You are more likely to develop this condition if you:  Have a family history of gout.  Are male and middle-aged.  Are male and have gone through menopause.  Are obese.  Frequently drink alcohol, especially beer.  Are dehydrated.  Lose weight too quickly.  Have an organ transplant.  Have lead poisoning.  Take certain medicines, including aspirin, cyclosporine, diuretics, levodopa, and niacin.  Have kidney disease.  Have a skin condition called psoriasis. What are the signs or symptoms? An attack of acute gout happens quickly. It usually occurs in just one joint. The most common place is  the big toe. Attacks often start at night. Other joints that may be affected include joints of the feet, ankle, knee, fingers, wrist, or elbow. Symptoms of this condition may include:  Severe pain.  Warmth.  Swelling.  Stiffness.  Tenderness. The affected joint may be very painful to touch.  Shiny, red, or purple skin.  Chills and fever. Chronic gout may cause symptoms more frequently. More joints may be involved. You may also have white or yellow lumps (tophi) on your hands or feet or in other areas near your joints. How is this diagnosed? This condition is diagnosed based on your symptoms, medical history, and physical exam. You may have tests, such as:  Blood tests to measure uric acid levels.  Removal of joint fluid with a thin needle (aspiration) to look for uric acid crystals.  X-rays to look for joint damage. How is this treated? Treatment for this condition has two phases: treating an acute attack and preventing future attacks. Acute gout treatment may include medicines to reduce pain and swelling, including:  NSAIDs.  Steroids. These are strong anti-inflammatory medicines that can be taken by mouth (orally) or injected into a joint.  Colchicine. This medicine relieves pain and swelling when it is taken soon after an attack. It can be given by mouth or through an IV. Preventive treatment may include:  Daily use of smaller doses of NSAIDs or colchicine.  Use of a medicine that reduces uric acid levels in your blood.  Changes to your diet. You may   need to see a dietitian about what to eat and drink to prevent gout. Follow these instructions at home: During a gout attack   If directed, put ice on the affected area: ? Put ice in a plastic bag. ? Place a towel between your skin and the bag. ? Leave the ice on for 20 minutes, 2-3 times a day.  Raise (elevate) the affected joint above the level of your heart as often as possible.  Rest the joint as much as possible.  If the affected joint is in your leg, you may be given crutches to use.  Follow instructions from your health care provider about eating or drinking restrictions. Avoiding future gout attacks  Follow a low-purine diet as told by your dietitian or health care provider. Avoid foods and drinks that are high in purines, including liver, kidney, anchovies, asparagus, herring, mushrooms, mussels, and beer.  Maintain a healthy weight or lose weight if you are overweight. If you want to lose weight, talk with your health care provider. It is important that you do not lose weight too quickly.  Start or maintain an exercise program as told by your health care provider. Eating and drinking  Drink enough fluids to keep your urine pale yellow.  If you drink alcohol: ? Limit how much you use to:  0-1 drink a day for women.  0-2 drinks a day for men. ? Be aware of how much alcohol is in your drink. In the [place] one drink equals one 12 oz bottle of beer (355 mL) one 5 oz glass of wine (148 mL), or one 1 oz glass of hard liquor (44 mL). General instructions  Take over-the-counter and prescription medicines only as told by your health care provider.  Do not drive or use heavy machinery while taking prescription pain medicine.  Return to your normal activities as told by your health care provider. Ask your health care provider what activities are safe for you.  Keep all follow-up visits as told by your health care provider. This is important. Contact a health care provider if you have:  Another gout attack.  Continuing symptoms of a gout attack after 10 days of treatment.  Side effects from your medicines.  Chills or a fever.  Burning pain when you urinate.  Pain in your lower back or belly. Get help right away if you:  Have severe or uncontrolled pain.  Cannot urinate. Summary  Gout is painful swelling of the joints caused by inflammation.  The most common site of pain is the big  toe, but it can affect other joints in the body.  Medicines and dietary changes can help to prevent and treat gout attacks. This information is not intended to replace advice given to you by your health care provider. Make sure you discuss any questions you have with your health care provider. Document Revised: 05/22/2018 Document Reviewed: 05/22/2018 Elsevier Patient Education  2020 [employer] Inc.  

## 2020-01-03 ENCOUNTER — Ambulatory Visit: Payer: Medicare Other | Attending: Internal Medicine

## 2020-01-03 DIAGNOSIS — Z23 Encounter for immunization: Secondary | ICD-10-CM | POA: Insufficient documentation

## 2020-01-03 NOTE — Progress Notes (Signed)
   Covid-19 Vaccination Clinic  Name:  Richard Blake    MRN: [id number] DOB: [birth year]  01/03/2020  [name] was observed post Covid-19 immunization for 30 minutes based on pre-vaccination screening without incidence. He was provided with Vaccine Information Sheet and instruction to access the V-Safe system.   [name] was instructed to call 911 with any severe reactions post vaccine: Marland Kitchen Difficulty breathing  . Swelling of your face and throat  . A fast heartbeat  . A bad rash all over your body  . Dizziness and weakness    Immunizations Administered    Name Date Dose VIS Date Route   Pfizer COVID-19 Vaccine 01/03/2020  9:12 AM 0.3 mL 10/24/2019 Intramuscular   Manufacturer: [place]   Lot: [id number]   [place]: [id number]

## 2020-01-15 DIAGNOSIS — M9903 Segmental and somatic dysfunction of lumbar region: Secondary | ICD-10-CM | POA: Diagnosis not present

## 2020-01-15 DIAGNOSIS — M9904 Segmental and somatic dysfunction of sacral region: Secondary | ICD-10-CM | POA: Diagnosis not present

## 2020-01-15 DIAGNOSIS — M5136 Other intervertebral disc degeneration, lumbar region: Secondary | ICD-10-CM | POA: Diagnosis not present

## 2020-01-15 DIAGNOSIS — M9905 Segmental and somatic dysfunction of pelvic region: Secondary | ICD-10-CM | POA: Diagnosis not present

## 2020-01-20 DIAGNOSIS — Z961 Presence of intraocular lens: Secondary | ICD-10-CM | POA: Diagnosis not present

## 2020-01-20 DIAGNOSIS — H26491 Other secondary cataract, right eye: Secondary | ICD-10-CM | POA: Diagnosis not present

## 2020-01-20 DIAGNOSIS — H18413 Arcus senilis, bilateral: Secondary | ICD-10-CM | POA: Diagnosis not present

## 2020-01-20 DIAGNOSIS — H26492 Other secondary cataract, left eye: Secondary | ICD-10-CM | POA: Diagnosis not present

## 2020-01-22 ENCOUNTER — Other Ambulatory Visit: Payer: Self-pay

## 2020-01-22 ENCOUNTER — Ambulatory Visit (INDEPENDENT_AMBULATORY_CARE_PROVIDER_SITE_OTHER): Payer: Medicare Other | Admitting: Internal Medicine

## 2020-01-22 ENCOUNTER — Encounter: Payer: Self-pay | Admitting: Internal Medicine

## 2020-01-22 VITALS — BP 138/86 | HR 62 | Temp 98.1°F | Resp 16 | Ht 67.0 in | Wt 177.5 lb

## 2020-01-22 DIAGNOSIS — N138 Other obstructive and reflux uropathy: Secondary | ICD-10-CM | POA: Diagnosis not present

## 2020-01-22 DIAGNOSIS — M1A39X0 Chronic gout due to renal impairment, multiple sites, without tophus (tophi): Secondary | ICD-10-CM | POA: Diagnosis not present

## 2020-01-22 DIAGNOSIS — N183 Chronic kidney disease, stage 3 unspecified: Secondary | ICD-10-CM

## 2020-01-22 DIAGNOSIS — I10 Essential (primary) hypertension: Secondary | ICD-10-CM

## 2020-01-22 DIAGNOSIS — D500 Iron deficiency anemia secondary to blood loss (chronic): Secondary | ICD-10-CM | POA: Diagnosis not present

## 2020-01-22 DIAGNOSIS — I4819 Other persistent atrial fibrillation: Secondary | ICD-10-CM

## 2020-01-22 DIAGNOSIS — N401 Enlarged prostate with lower urinary tract symptoms: Secondary | ICD-10-CM

## 2020-01-22 LAB — CBC WITH DIFFERENTIAL/PLATELET
Basophils Absolute: 0 10*3/uL (ref 0.0–0.1)
Basophils Relative: 1 % (ref 0.0–3.0)
Eosinophils Absolute: 0.1 10*3/uL (ref 0.0–0.7)
Eosinophils Relative: 3 % (ref 0.0–5.0)
HCT: 46.4 % (ref 39.0–52.0)
Hemoglobin: 14.9 g/dL (ref 13.0–17.0)
Lymphocytes Relative: 22.4 % (ref 12.0–46.0)
Lymphs Abs: 1 10*3/uL (ref 0.7–4.0)
MCHC: 32 g/dL (ref 30.0–36.0)
MCV: 87.6 fl (ref 78.0–100.0)
Monocytes Absolute: 0.5 10*3/uL (ref 0.1–1.0)
Monocytes Relative: 10.9 % (ref 3.0–12.0)
Neutro Abs: 2.8 10*3/uL (ref 1.4–7.7)
Neutrophils Relative %: 62.7 % (ref 43.0–77.0)
Platelets: 179 10*3/uL (ref 150.0–400.0)
RBC: 5.3 Mil/uL (ref 4.22–5.81)
RDW: 15.3 % (ref 11.5–15.5)
WBC: 4.4 10*3/uL (ref 4.0–10.5)

## 2020-01-22 LAB — IBC PANEL
Iron: 64 ug/dL (ref 42–165)
Saturation Ratios: 14.1 % — ABNORMAL LOW (ref 20.0–50.0)
Transferrin: 325 mg/dL (ref 212.0–360.0)

## 2020-01-22 LAB — BASIC METABOLIC PANEL
BUN: 32 mg/dL — ABNORMAL HIGH (ref 6–23)
CO2: 30 mEq/L (ref 19–32)
Calcium: 9.8 mg/dL (ref 8.4–10.5)
Chloride: 101 mEq/L (ref 96–112)
Creatinine, Ser: 1.9 mg/dL — ABNORMAL HIGH (ref 0.40–1.50)
GFR: 42.33 mL/min — ABNORMAL LOW (ref >60.00)
Glucose, Bld: 81 mg/dL (ref 70–99)
Potassium: 4.1 mEq/L (ref 3.5–5.1)
Sodium: 140 mEq/L (ref 135–145)

## 2020-01-22 LAB — FERRITIN: Ferritin: 9.8 ng/mL — ABNORMAL LOW (ref 22.0–322.0)

## 2020-01-22 LAB — PSA: PSA: 2.32 ng/mL (ref 0.10–4.00)

## 2020-01-22 NOTE — Progress Notes (Signed)
Subjective:  Patient ID: Richard Blake, male    DOB: 17-Apr-1946  Age: 74 y.o. MRN: [id number]  CC: Anemia, Hypertension, and Atrial Fibrillation  This visit occurred during the SARS-CoV-2 public health emergency.  Safety protocols were in place, including screening questions prior to the visit, additional usage of staff PPE, and extensive cleaning of exam room while observing appropriate contact time as indicated for disinfecting solutions.    HPI Richard Blake presents for f/up - His baseline level of fatigue and DOE has been a little better over the last few months than it was previously.  He is very active and denies any recent episodes of chest pain, diaphoresis, dizziness, lightheadedness, edema, or palpitations.  He has intentionally lost weight with lifestyle modifications.  Outpatient Medications Prior to Visit  Medication Sig Dispense Refill  . acetaminophen (TYLENOL) 500 MG tablet Take 500-1,000 mg by mouth every 6 (six) hours as needed (for pain from muscle spasms).    . Cholecalciferol 50 MCG (2000 UT) TABS Take 1 tablet (2,000 Units total) by mouth daily. 90 tablet 1  . Colchicine (MITIGARE) 0.6 MG CAPS Take 1 tablet by mouth 2 (two) times daily. 60 capsule 5  . Dietary Management Product (FOSTEUM PLUS) CAPS Take 1 capsule by mouth 2 (two) times daily. 60 capsule 11  . ferrous sulfate 325 (65 FE) MG tablet Take 1 tablet (325 mg total) by mouth 3 (three) times daily with meals. 90 tablet 2  . finasteride (PROSCAR) 5 MG tablet Take 1 tablet (5 mg total) by mouth daily. 90 tablet 1  . losartan (COZAAR) 100 MG tablet TAKE 1 TABLET BY MOUTH EVERY DAY 90 tablet 1  . metoprolol tartrate (LOPRESSOR) 25 MG tablet Take 1 tablet (25 mg total) by mouth 2 (two) times daily. 180 tablet 1  . pantoprazole (PROTONIX) 40 MG tablet Take 1 tablet (40 mg total) by mouth daily. 90 tablet 1  . prednisoLONE acetate (PRED FORTE) 1 % ophthalmic suspension Instill 1 drop into affected eye four times  a day  for 5 days, then stop.    . Rivaroxaban (XARELTO) 15 MG TABS tablet TAKE 1 TABLET BY MOUTH ONCE DAILY WITH SUPPER 90 tablet 1  . rosuvastatin (CRESTOR) 10 MG tablet Take 1 tablet (10 mg total) by mouth daily. 90 tablet 1  . SYSTANE ULTRA 0.4-0.3 % SOLN SMARTSIG:1 Drop(s) In Eye(s) As Needed    . torsemide (DEMADEX) 20 MG tablet Take 1 tablet (20 mg total) by mouth daily. 90 tablet 1  . vitamin B-12 (CYANOCOBALAMIN) 250 MCG tablet Take 250 mcg by mouth daily.       No facility-administered medications prior to visit.    ROS Review of Systems  Constitutional: Positive for fatigue. Negative for appetite change, chills, diaphoresis, fever and unexpected weight change.  HENT: Negative.   Eyes: Negative for visual disturbance.  Respiratory: Positive for shortness of breath. Negative for cough, chest tightness and wheezing.   Cardiovascular: Negative for chest pain, palpitations and leg swelling.  Gastrointestinal: Negative for abdominal pain, constipation, diarrhea, nausea and vomiting.  Endocrine: Negative.   Genitourinary: Negative.  Negative for difficulty urinating and dysuria.  Musculoskeletal: Negative for arthralgias and myalgias.  Skin: Negative.  Negative for color change and pallor.  Neurological: Negative.  Negative for dizziness, weakness and light-headedness.  Hematological: Negative for adenopathy. Does not bruise/bleed easily.  Psychiatric/Behavioral: Negative.     Objective:  BP 138/86 (BP Location: Left Arm, Patient Position: Sitting, Cuff Size: Large)  Pulse 62   Temp 98.1 F (36.7 C) (Oral)   Resp 16   Ht '5\' 7"'  (1.702 m)   Wt 177 lb 8 oz (80.5 kg)   SpO2 98%   BMI 27.80 kg/m   BP Readings from Last 3 Encounters:  01/22/20 138/86  12/15/19 140/78  10/01/19 122/84    Wt Readings from Last 3 Encounters:  01/22/20 177 lb 8 oz (80.5 kg)  12/15/19 182 lb (82.6 kg)  10/01/19 176 lb (79.8 kg)    Physical Exam Vitals reviewed.  Constitutional:       Appearance: Normal appearance.  HENT:     Nose: Nose normal.     Mouth/Throat:     Pharynx: Oropharynx is clear.  Eyes:     General: No scleral icterus.    Conjunctiva/sclera: Conjunctivae normal.  Cardiovascular:     Rate and Rhythm: Normal rate. Rhythm irregularly irregular.     Heart sounds: No murmur. No gallop.   Pulmonary:     Effort: Pulmonary effort is normal.     Breath sounds: No stridor. No wheezing, rhonchi or rales.  Abdominal:     General: Abdomen is flat. Bowel sounds are normal.     Palpations: There is no hepatomegaly, splenomegaly or mass.     Tenderness: There is no abdominal tenderness.  Musculoskeletal:        General: Normal range of motion.     Cervical back: Neck supple.     Right lower leg: No edema.     Left lower leg: No edema.  Lymphadenopathy:     Cervical: No cervical adenopathy.  Skin:    General: Skin is warm and dry.     Coloration: Skin is not pale.  Neurological:     General: No focal deficit present.     Mental Status: He is alert.  Psychiatric:        Mood and Affect: Mood normal.        Behavior: Behavior normal.     Lab Results  Component Value Date   WBC 4.4 01/22/2020   HGB 14.9 01/22/2020   HCT 46.4 01/22/2020   PLT 179.0 01/22/2020   GLUCOSE 81 01/22/2020   CHOL 217 (H) 08/19/2019   TRIG 137.0 08/19/2019   HDL 54.90 08/19/2019   LDLDIRECT 186.5 10/23/2012   LDLCALC 135 (H) 08/19/2019   ALT 15 04/22/2018   AST 15 04/22/2018   NA 140 01/22/2020   K 4.1 01/22/2020   CL 101 01/22/2020   CREATININE 1.90 (H) 01/22/2020   BUN 32 (H) 01/22/2020   CO2 30 01/22/2020   TSH 0.84 08/19/2019   PSA 2.32 01/22/2020   INR 1.1 12/14/2016    VAS [place] LOWER EXTREMITY VENOUS POST ABLATION  Result Date: 12/11/2018  Lower Venous Study Indications: Pain.  Risk Factors: Surgery Follow up post right great saphenous vein ablation 12-04-2018. Performing Technologist: Delorise Shiner RVT  Examination Guidelines: A complete evaluation includes  B-mode imaging, spectral Doppler, color Doppler, and power Doppler as needed of all accessible portions of each vessel. Bilateral testing is considered an integral part of a complete examination. Limited examinations for reoccurring indications may be performed as noted.  Right Venous Findings: +---+---------------+---------+-----------+--------------------+-------+    CompressibilityPhasicitySpontaneityProperties          Summary +---+---------------+---------+-----------+--------------------+-------+ CFVFull                    Yes                                    +---+---------------+---------+-----------+--------------------+-------+  SFJPartial                                                        +---+---------------+---------+-----------+--------------------+-------+ GSVNone                               spongy w/compression        +---+---------------+---------+-----------+--------------------+-------+ 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  Left Venous Findings: +---+---------------+---------+-----------+----------+-------+    CompressibilityPhasicitySpontaneityPropertiesSummary +---+---------------+---------+-----------+----------+-------+ CFVFull                    Yes                          +---+---------------+---------+-----------+----------+-------+    Summary: Right: No evidence of common femoral vein obstruction. Successful vein closure. Left: No evidence of common femoral vein obstruction.  *See table(s) above for measurements and observations. Electronically signed by Ruta Hinds MD on 12/11/2018 at 10:33:20 AM.    Final     Assessment & Plan:   Davyn was seen today for anemia, hypertension and atrial fibrillation.  Diagnoses and all orders for this visit:  Essential hypertension- His blood pressure is adequately well controlled.  Electrolytes are normal.  Renal function is stable. -     Basic metabolic  panel  Persistent atrial fibrillation- He has excellent rate control.  Will continue anticoagulation with the DOAC.  CRI (chronic renal insufficiency), stage 3 (moderate)- His renal function is stable.  He will continue to avoid nephrotoxic agents.  Will continue to maintain control of his blood pressure. -     Basic metabolic panel  Iron deficiency anemia due to chronic blood loss- His H&H are normal and his iron level is normal. -     CBC with Differential/Platelet -     Ferritin -     IBC panel  BPH with obstruction/lower urinary tract symptoms- His PSA is not rising which is a reassuring sign that he does not have prostate cancer.  He has no symptoms that need to be treated. -     PSA  Chronic gout due to renal impairment of multiple sites without tophus -     Uric acid -     HLA-B*58:01 Typing   I am having Michail Sermon. Chrissie Noa maintain his vitamin B-12, acetaminophen, metoprolol tartrate, torsemide, finasteride, Rivaroxaban, rosuvastatin, pantoprazole, Fosteum Plus, Cholecalciferol, losartan, ferrous sulfate, Colchicine, prednisoLONE acetate, and Systane Ultra.  No orders of the defined types were placed in this encounter.    Follow-up: Return in about 6 months (around 07/24/2020).  Scarlette Calico, MD

## 2020-01-24 ENCOUNTER — Encounter: Payer: Self-pay | Admitting: Internal Medicine

## 2020-01-27 ENCOUNTER — Ambulatory Visit: Payer: Medicare Other | Attending: Internal Medicine

## 2020-01-27 DIAGNOSIS — Z23 Encounter for immunization: Secondary | ICD-10-CM

## 2020-01-27 NOTE — Progress Notes (Signed)
   Covid-19 Vaccination Clinic  Name:  Richard Blake    MRN: [id number] DOB: January 27, 1946  01/27/2020  [name] was observed post Covid-19 immunization for 15 minutes without incident. He was provided with Vaccine Information Sheet and instruction to access the V-Safe system.   [name] was instructed to call 911 with any severe reactions post vaccine: Marland Kitchen Difficulty breathing  . Swelling of face and throat  . A fast heartbeat  . A bad rash all over body  . Dizziness and weakness   Immunizations Administered    Name Date Dose VIS Date Route   Pfizer COVID-19 Vaccine 01/27/2020  9:55 AM 0.3 mL 10/24/2019 Intramuscular   Manufacturer: [place]   Lot: [id number]   [place]: [id number]

## 2020-01-28 DIAGNOSIS — Z9842 Cataract extraction status, left eye: Secondary | ICD-10-CM | POA: Diagnosis not present

## 2020-06-14 ENCOUNTER — Other Ambulatory Visit: Payer: Self-pay | Admitting: Internal Medicine

## 2020-06-14 DIAGNOSIS — N138 Other obstructive and reflux uropathy: Secondary | ICD-10-CM

## 2020-07-06 ENCOUNTER — Other Ambulatory Visit: Payer: Self-pay | Admitting: Internal Medicine

## 2020-07-06 DIAGNOSIS — I10 Essential (primary) hypertension: Secondary | ICD-10-CM

## 2020-08-02 DIAGNOSIS — M9902 Segmental and somatic dysfunction of thoracic region: Secondary | ICD-10-CM | POA: Diagnosis not present

## 2020-08-02 DIAGNOSIS — M5136 Other intervertebral disc degeneration, lumbar region: Secondary | ICD-10-CM | POA: Diagnosis not present

## 2020-08-02 DIAGNOSIS — M9903 Segmental and somatic dysfunction of lumbar region: Secondary | ICD-10-CM | POA: Diagnosis not present

## 2020-08-02 DIAGNOSIS — M5134 Other intervertebral disc degeneration, thoracic region: Secondary | ICD-10-CM | POA: Diagnosis not present

## 2020-08-11 ENCOUNTER — Ambulatory Visit (INDEPENDENT_AMBULATORY_CARE_PROVIDER_SITE_OTHER): Payer: Medicare Other | Admitting: Internal Medicine

## 2020-08-11 ENCOUNTER — Encounter: Payer: Self-pay | Admitting: Internal Medicine

## 2020-08-11 ENCOUNTER — Other Ambulatory Visit: Payer: Self-pay

## 2020-08-11 VITALS — BP 132/84 | HR 60 | Temp 98.5°F | Resp 16 | Ht 67.0 in | Wt 178.0 lb

## 2020-08-11 DIAGNOSIS — N183 Chronic kidney disease, stage 3 unspecified: Secondary | ICD-10-CM

## 2020-08-11 DIAGNOSIS — I4819 Other persistent atrial fibrillation: Secondary | ICD-10-CM | POA: Diagnosis not present

## 2020-08-11 DIAGNOSIS — D500 Iron deficiency anemia secondary to blood loss (chronic): Secondary | ICD-10-CM | POA: Diagnosis not present

## 2020-08-11 DIAGNOSIS — E785 Hyperlipidemia, unspecified: Secondary | ICD-10-CM | POA: Diagnosis not present

## 2020-08-11 DIAGNOSIS — M62542 Muscle wasting and atrophy, not elsewhere classified, left hand: Secondary | ICD-10-CM | POA: Diagnosis not present

## 2020-08-11 DIAGNOSIS — D508 Other iron deficiency anemias: Secondary | ICD-10-CM

## 2020-08-11 DIAGNOSIS — Z23 Encounter for immunization: Secondary | ICD-10-CM

## 2020-08-11 DIAGNOSIS — N2889 Other specified disorders of kidney and ureter: Secondary | ICD-10-CM

## 2020-08-11 DIAGNOSIS — M1A39X0 Chronic gout due to renal impairment, multiple sites, without tophus (tophi): Secondary | ICD-10-CM | POA: Diagnosis not present

## 2020-08-11 DIAGNOSIS — I10 Essential (primary) hypertension: Secondary | ICD-10-CM | POA: Diagnosis not present

## 2020-08-11 NOTE — Patient Instructions (Signed)

## 2020-08-11 NOTE — Progress Notes (Signed)
Subjective:  Patient ID: Richard Blake, male    DOB: December 10, 1945  Age: 74 y.o. MRN: [id number]  CC: Hypertension  This visit occurred during the SARS-CoV-2 public health emergency.  Safety protocols were in place, including screening questions prior to the visit, additional usage of staff PPE, and extensive cleaning of exam room while observing appropriate contact time as indicated for disinfecting solutions.    HPI KLAY SOBOTKA presents for f/up -   1.  He complains of worsening atrophy in his hands, worse on the left than the right.  He knows he has cervical disease but he has been treating that by seeing a chiropractor.  He thinks he is ready to see a neurologist but does not want to consider surgery. 2.  He continues to complain of mild discomfort in his right foot.  He does not have any swelling and says it is getting better but he is concerned that his gout needs to be treated more aggressively.  None of his other joints bother him. 3.  Tells me his blood pressure has been well controlled.  He is not aware of any recent palpitations and he denies dizziness, lightheadedness, near syncope, CP, DOE, palpitations, edema, or fatigue.  Outpatient Medications Prior to Visit  Medication Sig Dispense Refill  . acetaminophen (TYLENOL) 500 MG tablet Take 500-1,000 mg by mouth every 6 (six) hours as needed (for pain from muscle spasms).    . Cholecalciferol 50 MCG (2000 UT) TABS Take 1 tablet (2,000 Units total) by mouth daily. 90 tablet 1  . Colchicine (MITIGARE) 0.6 MG CAPS Take 1 tablet by mouth 2 (two) times daily. 60 capsule 5  . Dietary Management Product (FOSTEUM PLUS) CAPS Take 1 capsule by mouth 2 (two) times daily. 60 capsule 11  . ferrous sulfate 325 (65 FE) MG tablet Take 1 tablet (325 mg total) by mouth 3 (three) times daily with meals. 90 tablet 2  . finasteride (PROSCAR) 5 MG tablet TAKE 1 TABLET(5 MG) BY MOUTH DAILY 90 tablet 1  . losartan (COZAAR) 100 MG tablet TAKE 1  TABLET BY MOUTH EVERY DAY 90 tablet 1  . metoprolol tartrate (LOPRESSOR) 25 MG tablet Take 1 tablet (25 mg total) by mouth 2 (two) times daily. 180 tablet 1  . pantoprazole (PROTONIX) 40 MG tablet Take 1 tablet (40 mg total) by mouth daily. 90 tablet 1  . prednisoLONE acetate (PRED FORTE) 1 % ophthalmic suspension Instill 1 drop into affected eye four times a day  for 5 days, then stop.    . Rivaroxaban (XARELTO) 15 MG TABS tablet TAKE 1 TABLET BY MOUTH ONCE DAILY WITH SUPPER 90 tablet 1  . SYSTANE ULTRA 0.4-0.3 % SOLN SMARTSIG:1 Drop(s) In Eye(s) As Needed    . torsemide (DEMADEX) 20 MG tablet TAKE 1 TABLET(20 MG) BY MOUTH DAILY 90 tablet 0  . vitamin B-12 (CYANOCOBALAMIN) 250 MCG tablet Take 250 mcg by mouth daily.      . rosuvastatin (CRESTOR) 10 MG tablet Take 1 tablet (10 mg total) by mouth daily. 90 tablet 1   No facility-administered medications prior to visit.    ROS Review of Systems  Constitutional: Negative.  Negative for appetite change, diaphoresis, fatigue and unexpected weight change.  HENT: Negative.   Eyes: Negative.   Respiratory: Negative for cough, chest tightness, shortness of breath and wheezing.   Cardiovascular: Negative for chest pain, palpitations and leg swelling.  Gastrointestinal: Negative for abdominal pain, blood in stool, constipation, diarrhea, nausea and vomiting.  Endocrine: Negative.   Genitourinary: Negative.  Negative for difficulty urinating and hematuria.  Musculoskeletal: Positive for arthralgias and neck pain. Negative for myalgias.  Skin: Negative.  Negative for color change, pallor and rash.  Neurological: Positive for weakness. Negative for light-headedness and headaches.  Hematological: Negative for adenopathy. Does not bruise/bleed easily.  Psychiatric/Behavioral: Negative.     Objective:  BP 132/84   Pulse 60   Temp 98.5 F (36.9 C) (Oral)   Resp 16   Ht 5\' 7"  (1.702 m)   Wt 178 lb (80.7 kg)   SpO2 97%   BMI 27.88 kg/m   BP  Readings from Last 3 Encounters:  08/11/20 132/84  01/22/20 138/86  12/15/19 140/78    Wt Readings from Last 3 Encounters:  08/11/20 178 lb (80.7 kg)  01/22/20 177 lb 8 oz (80.5 kg)  12/15/19 182 lb (82.6 kg)    Physical Exam Vitals reviewed.  HENT:     Nose: Nose normal.     Mouth/Throat:     Mouth: Mucous membranes are moist.     Pharynx: No oropharyngeal exudate.  Eyes:     General: No scleral icterus.    Conjunctiva/sclera: Conjunctivae normal.  Cardiovascular:     Rate and Rhythm: Normal rate. Rhythm irregularly irregular.     Heart sounds: No murmur heard.   Pulmonary:     Effort: Pulmonary effort is normal.     Breath sounds: No wheezing or rhonchi.  Abdominal:     General: Abdomen is flat.     Palpations: There is no mass.     Tenderness: There is no abdominal tenderness. There is no guarding.  Musculoskeletal:        General: No swelling. Normal range of motion.     Right lower leg: No edema.     Left lower leg: No edema.  Skin:    General: Skin is warm and dry.     Coloration: Skin is not pale.  Neurological:     Mental Status: He is alert.     Cranial Nerves: No cranial nerve deficit.     Sensory: No sensory deficit.     Motor: Weakness and atrophy present. No tremor.     Coordination: Coordination is intact. Coordination normal.     Gait: Gait abnormal.     Deep Tendon Reflexes: Reflexes normal.     Comments: Moderate atrophy in both hands L>>>R  Psychiatric:        Mood and Affect: Mood normal.        Behavior: Behavior normal.     Lab Results  Component Value Date   WBC 3.4 (L) 08/11/2020   HGB 14.5 08/11/2020   HCT 46.8 08/11/2020   PLT 171 08/11/2020   GLUCOSE 81 08/11/2020   CHOL 196 08/11/2020   TRIG 118 08/11/2020   HDL 53 08/11/2020   LDLDIRECT 186.5 10/23/2012   LDLCALC 120 (H) 08/11/2020   ALT 15 04/22/2018   AST 15 04/22/2018   NA 141 08/11/2020   K 4.2 08/11/2020   CL 102 08/11/2020   CREATININE 1.66 (H) 08/11/2020   BUN  17 08/11/2020   CO2 30 08/11/2020   TSH 0.94 08/11/2020   PSA 2.32 01/22/2020   INR 1.1 12/14/2016    VAS [place] LOWER EXTREMITY VENOUS POST ABLATION  Result Date: 12/11/2018  Lower Venous Study Indications: Pain.  Risk Factors: Surgery Follow up post right great saphenous vein ablation 12-04-2018. Performing Technologist: Delorise Shiner RVT  Examination Guidelines: A complete  evaluation includes B-mode imaging, spectral Doppler, color Doppler, and power Doppler as needed of all accessible portions of each vessel. Bilateral testing is considered an integral part of a complete examination. Limited examinations for reoccurring indications may be performed as noted.  Right Venous Findings: +---+---------------+---------+-----------+--------------------+-------+    CompressibilityPhasicitySpontaneityProperties          Summary +---+---------------+---------+-----------+--------------------+-------+ CFVFull                    Yes                                    +---+---------------+---------+-----------+--------------------+-------+ SFJPartial                                                        +---+---------------+---------+-----------+--------------------+-------+ GSVNone                               spongy w/compression        +---+---------------+---------+-----------+--------------------+-------+ 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  Left Venous Findings: +---+---------------+---------+-----------+----------+-------+    CompressibilityPhasicitySpontaneityPropertiesSummary +---+---------------+---------+-----------+----------+-------+ CFVFull                    Yes                          +---+---------------+---------+-----------+----------+-------+    Summary: Right: No evidence of common femoral vein obstruction. Successful vein closure. Left: No evidence of common femoral vein obstruction.  *See table(s) above for  measurements and observations. Electronically signed by Ruta Hinds MD on 12/11/2018 at 10:33:20 AM.    Final     Assessment & Plan:   Lavern was seen today for hypertension.  Diagnoses and all orders for this visit:  Flu vaccine need -     Flu Vaccine QUAD High Dose(Fluad)  Atrophy of left hand muscles -     Ambulatory referral to Neurology  Essential hypertension- His blood pressure is adequately well controlled. -     BASIC METABOLIC PANEL WITH GFR; Future -     TSH; Future -     TSH -     BASIC METABOLIC PANEL WITH GFR  Persistent atrial fibrillation- He is in permanent atrial fibrillation but has good rate control with metoprolol.  Will continue anticoagulation with the DOAC. -     TSH; Future -     TSH  CRI (chronic renal insufficiency), stage 3 (moderate)- His renal function is stable. -     BASIC METABOLIC PANEL WITH GFR; Future -     BASIC METABOLIC PANEL WITH GFR  Iron deficiency anemia due to chronic blood loss- His H&H are normal now. -     CBC with Differential/Platelet; Future -     CBC with Differential/Platelet  Chronic gout due to renal impairment of multiple sites without tophus- His uric acid level is elevated at 9.0.  I recommended that he start taking allopurinol. -     Uric acid; Future -     Uric acid -     allopurinol (ZYLOPRIM) 100 MG tablet; Take 1 tablet (100 mg total) by mouth daily.  Hyperlipidemia with target LDL less than 130- He has  not achieved his LDL goal of less than 100.  It looks like he has not recently been compliant with the statin.   I asked him to restart the statin for cardiovascular risk reduction. -     Lipid panel; Future -     Lipid panel -     rosuvastatin (CRESTOR) 10 MG tablet; Take 1 tablet (10 mg total) by mouth daily.  Other iron deficiency anemia  Other orders -     Tdap vaccine greater than or equal to 7yo IM   I am having Michail Sermon. Chrissie Noa start on allopurinol. I am also having him maintain his vitamin B-12,  acetaminophen, metoprolol tartrate, Rivaroxaban, pantoprazole, Fosteum Plus, Cholecalciferol, losartan, ferrous sulfate, Colchicine, prednisoLONE acetate, Systane Ultra, finasteride, torsemide, and rosuvastatin.  Meds ordered this encounter  Medications  . rosuvastatin (CRESTOR) 10 MG tablet    Sig: Take 1 tablet (10 mg total) by mouth daily.    Dispense:  90 tablet    Refill:  1  . allopurinol (ZYLOPRIM) 100 MG tablet    Sig: Take 1 tablet (100 mg total) by mouth daily.    Dispense:  90 tablet    Refill:  0   I spent 50 minutes in preparing to see the patient by review of recent labs, imaging and procedures, obtaining and reviewing separately obtained history, communicating with the patient and family or caregiver, ordering medications, tests or procedures, and documenting clinical information in the EHR including the differential Dx, treatment, and any further evaluation and other management of 1. Atrophy of left hand muscles 2. Essential hypertension 3. Persistent atrial fibrillation 4. CRI (chronic renal insufficiency), stage 3 (moderate) 5. Iron deficiency anemia due to chronic blood loss 6. Chronic gout due to renal impairment of multiple sites without tophus 7. Hyperlipidemia with target LDL less than 130     Follow-up: Return in about 6 months (around 02/08/2021).  Scarlette Calico, MD

## 2020-08-12 LAB — CBC WITH DIFFERENTIAL/PLATELET
Absolute Monocytes: 347 cells/uL (ref 200–950)
Basophils Absolute: 51 cells/uL (ref 0–200)
Basophils Relative: 1.5 %
Eosinophils Absolute: 150 cells/uL (ref 15–500)
Eosinophils Relative: 4.4 %
HCT: 46.8 % (ref 38.5–50.0)
Hemoglobin: 14.5 g/dL (ref 13.2–17.1)
Lymphs Abs: 782 cells/uL — ABNORMAL LOW (ref 850–3900)
MCH: 26.8 pg — ABNORMAL LOW (ref 27.0–33.0)
MCHC: 31 g/dL — ABNORMAL LOW (ref 32.0–36.0)
MCV: 86.3 fL (ref 80.0–100.0)
MPV: 13.2 fL — ABNORMAL HIGH (ref 7.5–12.5)
Monocytes Relative: 10.2 %
Neutro Abs: 2071 cells/uL (ref 1500–7800)
Neutrophils Relative %: 60.9 %
Platelets: 171 10*3/uL (ref 140–400)
RBC: 5.42 10*6/uL (ref 4.20–5.80)
RDW: 14.7 % (ref 11.0–15.0)
Total Lymphocyte: 23 %
WBC: 3.4 10*3/uL — ABNORMAL LOW (ref 3.8–10.8)

## 2020-08-12 LAB — LIPID PANEL
Cholesterol: 196 mg/dL (ref <200)
HDL: 53 mg/dL (ref >=40)
LDL Cholesterol (Calc): 120 mg/dL (calc) — ABNORMAL HIGH
Non-HDL Cholesterol (Calc): 143 mg/dL (calc) — ABNORMAL HIGH (ref <130)
Total CHOL/HDL Ratio: 3.7 (calc) (ref <5.0)
Triglycerides: 118 mg/dL (ref <150)

## 2020-08-12 LAB — BASIC METABOLIC PANEL WITH GFR
BUN/Creatinine Ratio: 10 (calc) (ref 6–22)
BUN: 17 mg/dL (ref 7–25)
CO2: 30 mmol/L (ref 20–32)
Calcium: 9.4 mg/dL (ref 8.6–10.3)
Chloride: 102 mmol/L (ref 98–110)
Creat: 1.66 mg/dL — ABNORMAL HIGH (ref 0.70–1.18)
GFR, Est African American: 47 mL/min/{1.73_m2} — ABNORMAL LOW (ref >=60)
GFR, Est Non African American: 41 mL/min/{1.73_m2} — ABNORMAL LOW (ref >=60)
Glucose, Bld: 81 mg/dL (ref 65–99)
Potassium: 4.2 mmol/L (ref 3.5–5.3)
Sodium: 141 mmol/L (ref 135–146)

## 2020-08-12 LAB — URIC ACID: Uric Acid, Serum: 9 mg/dL — ABNORMAL HIGH (ref 4.0–8.0)

## 2020-08-12 LAB — TSH: TSH: 0.94 mIU/L (ref 0.40–4.50)

## 2020-08-12 MED ORDER — ROSUVASTATIN CALCIUM 10 MG PO TABS: 10.0000 mg | ORAL_TABLET | Freq: Every day | ORAL | 1 refills | Status: DC

## 2020-08-12 MED ORDER — ALLOPURINOL 100 MG PO TABS: 100.0000 mg | ORAL_TABLET | Freq: Every day | ORAL | 0 refills | Status: DC

## 2020-08-24 ENCOUNTER — Telehealth: Payer: Self-pay | Admitting: Internal Medicine

## 2020-08-24 ENCOUNTER — Other Ambulatory Visit: Payer: Self-pay | Admitting: Internal Medicine

## 2020-08-24 DIAGNOSIS — E785 Hyperlipidemia, unspecified: Secondary | ICD-10-CM

## 2020-08-24 MED ORDER — ROSUVASTATIN CALCIUM 10 MG PO TABS: 10.0000 mg | ORAL_TABLET | Freq: Every day | ORAL | 1 refills | Status: DC

## 2020-08-24 NOTE — Telephone Encounter (Signed)
rosuvastatin (CRESTOR) 10 MG tablet Patient wondering why its being ordered through the mail in pharmacy instead of [place]. Stated he would like it to be sent to [facility] if the price wasn't different.   [place], [address] (Ph: [phone])   [facility] [phone] [address], [place] Phone:  [phone]  Fax:  [phone]      Last visit- 0929/21 Next visit - N/A

## 2020-08-28 ENCOUNTER — Ambulatory Visit: Payer: Medicare Other | Attending: Internal Medicine

## 2020-08-28 DIAGNOSIS — Z23 Encounter for immunization: Secondary | ICD-10-CM

## 2020-08-28 NOTE — Progress Notes (Signed)
   Covid-19 Vaccination Clinic  Name:  LODEN LAURENT    MRN: [id number] DOB: 18-Sep-1946  08/28/2020  Mr. Morandi was observed post Covid-19 immunization for 15 minutes without incident. He was provided with Vaccine Information Sheet and instruction to access the V-Safe system.   Mr. Petraglia was instructed to call 911 with any severe reactions post vaccine: Marland Kitchen Difficulty breathing  . Swelling of face and throat  . A fast heartbeat  . A bad rash all over body  . Dizziness and weakness

## 2020-09-04 ENCOUNTER — Other Ambulatory Visit: Payer: Self-pay | Admitting: Internal Medicine

## 2020-09-04 DIAGNOSIS — I4819 Other persistent atrial fibrillation: Secondary | ICD-10-CM

## 2020-09-30 DIAGNOSIS — M109 Gout, unspecified: Secondary | ICD-10-CM | POA: Diagnosis not present

## 2020-09-30 DIAGNOSIS — N183 Chronic kidney disease, stage 3 unspecified: Secondary | ICD-10-CM | POA: Diagnosis not present

## 2020-09-30 DIAGNOSIS — D509 Iron deficiency anemia, unspecified: Secondary | ICD-10-CM | POA: Diagnosis not present

## 2020-09-30 DIAGNOSIS — I129 Hypertensive chronic kidney disease with stage 1 through stage 4 chronic kidney disease, or unspecified chronic kidney disease: Secondary | ICD-10-CM | POA: Diagnosis not present

## 2020-09-30 DIAGNOSIS — I480 Paroxysmal atrial fibrillation: Secondary | ICD-10-CM | POA: Diagnosis not present

## 2020-10-13 ENCOUNTER — Encounter: Payer: Self-pay | Admitting: Internal Medicine

## 2020-10-13 ENCOUNTER — Other Ambulatory Visit: Payer: Self-pay

## 2020-10-13 ENCOUNTER — Ambulatory Visit (INDEPENDENT_AMBULATORY_CARE_PROVIDER_SITE_OTHER): Payer: Medicare Other | Admitting: Internal Medicine

## 2020-10-13 VITALS — BP 118/72 | HR 62 | Ht 67.0 in | Wt 182.4 lb

## 2020-10-13 DIAGNOSIS — I4819 Other persistent atrial fibrillation: Secondary | ICD-10-CM | POA: Diagnosis not present

## 2020-10-13 NOTE — Patient Instructions (Addendum)
Medication Instructions:  Your physician has recommended you make the following change in your medication:   1.  TAKE your weight daily  2.  Try taking your torsemide EVERY OTHER DAY   Labwork: None ordered.  Testing/Procedures: None ordered.  Follow-Up: Your physician wants you to follow-up in: one year with Dr. Lovena Le.   You will receive a reminder letter in the mail two months in advance. If you don't receive a letter, please call our office to schedule the follow-up appointment.   Any Other Special Instructions Will Be Listed Below (If Applicable).  If you need a refill on your cardiac medications before your next appointment, please call your pharmacy.

## 2020-10-13 NOTE — Progress Notes (Signed)
HPI [name] returns today for ongoing evaluation and management of his atrial fib. He is a pleasant 74 yo man with pulmonary sarcoid, chronic atrial fib and HTN. The patient also has dylipidemia. In the interim, he has managed to do well. He remains active. No syncope, chest pain or sob. He is walking. No cough. Allergies  Allergen Reactions  . Amlodipine Other (See Comments)    Edema   . Welchol [Colesevelam Hcl] Other (See Comments)    Stomach cramps  . Other Swelling    Hair dye - facial swelling   . Prolia [Denosumab] Other (See Comments)    Pain at injection sight     Current Outpatient Medications  Medication Sig Dispense Refill  . acetaminophen (TYLENOL) 500 MG tablet Take 500-1,000 mg by mouth every 6 (six) hours as needed (for pain from muscle spasms).    Marland Kitchen allopurinol (ZYLOPRIM) 100 MG tablet Take 1 tablet (100 mg total) by mouth daily. 90 tablet 0  . Cholecalciferol 50 MCG (2000 UT) TABS Take 1 tablet (2,000 Units total) by mouth daily. 90 tablet 1  . Colchicine (MITIGARE) 0.6 MG CAPS Take 1 tablet by mouth 2 (two) times daily. 60 capsule 5  . Dietary Management Product (FOSTEUM PLUS) CAPS Take 1 capsule by mouth 2 (two) times daily. 60 capsule 11  . ferrous sulfate 325 (65 FE) MG tablet Take 1 tablet (325 mg total) by mouth 3 (three) times daily with meals. 90 tablet 2  . finasteride (PROSCAR) 5 MG tablet TAKE 1 TABLET(5 MG) BY MOUTH DAILY 90 tablet 1  . losartan (COZAAR) 100 MG tablet TAKE 1 TABLET BY MOUTH EVERY DAY 90 tablet 1  . metoprolol tartrate (LOPRESSOR) 25 MG tablet Take 1 tablet (25 mg total) by mouth 2 (two) times daily. 180 tablet 1  . pantoprazole (PROTONIX) 40 MG tablet Take 1 tablet (40 mg total) by mouth daily. 90 tablet 1  . prednisoLONE acetate (PRED FORTE) 1 % ophthalmic suspension Instill 1 drop into affected eye four times a day  for 5 days, then stop.    . rosuvastatin (CRESTOR) 10 MG tablet Take 1 tablet (10 mg total) by mouth daily. 90  tablet 1  . SYSTANE ULTRA 0.4-0.3 % SOLN SMARTSIG:1 Drop(s) In Eye(s) As Needed    . torsemide (DEMADEX) 20 MG tablet TAKE 1 TABLET(20 MG) BY MOUTH DAILY 90 tablet 0  . vitamin B-12 (CYANOCOBALAMIN) 250 MCG tablet Take 250 mcg by mouth daily.      Alveda Reasons 15 MG TABS tablet TAKE 1 TABLET BY MOUTH EVERY DAY WITH SUPPER 90 tablet 1   No current facility-administered medications for this visit.     Past Medical History:  Diagnosis Date  . A-fib ([place]) 11/2016   Tx with po meds at this time  . Anemia    Microcytic with Fe Sat 4% 10/31/10  . Atypical chest pain   . Cataract    removed bilaterally  . Chronic rhinitis   . Cough   . Diverticulosis   . Dyspnea    " WITH MY AFIB "  . GERD (gastroesophageal reflux disease)   . Hiatal hernia   . Hiatal hernia   . History of recent blood transfusion 06/05/2018  . HLD (hyperlipidemia)   . HTN (hypertension)   . Interstitial lung disease ([place])   . Kidney stones   . Osteoarthritis   . Osteoporosis 2014  . Sarcoidosis 8/99   Transbronchial biopsy, taking daily prednisone since  November 08 on and off since 1999 w/cough every time he relapses off it for more than several weeks  . 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   Past Surgical History:  Procedure Laterality Date  . CARDIOVERSION N/A 12/21/2016   Procedure: CARDIOVERSION;  Surgeon: Evans Lance, MD;  Location: [facility];  Service: Cardiovascular;  Laterality: N/A;  . CARDIOVERSION N/A 01/31/2017   Procedure: CARDIOVERSION;  Surgeon: Pixie Casino, MD;  Location: [facility] ENDOSCOPY;  Service: Cardiovascular;  Laterality: N/A;  . CATARACT EXTRACTION Left 10/14/2013  . CATARACT EXTRACTION Right 11/24/2013  . CERVICAL FUSION     pt states he did not have a cervical fusion, just a biopsy   . COLONOSCOPY  01/12/2006   tics only   . ENDOVENOUS ABLATION SAPHENOUS VEIN W/  LASER Left 10/23/2018   endovenous laser ablation left greater saphenous vein by Ruta Hinds MD   . ENDOVENOUS ABLATION SAPHENOUS VEIN W/ LASER Right 12/04/2018   endovenous laser ablation right greater saphenous vein by Ruta Hinds MD   . [place]     right  . LIPOMA EXCISION Left 09/16/2004   forearm  . spinal cord biopsyOther]  1996   sarcoid     Family History  Problem Relation Age of Onset  . Asthma Mother   . Diabetes Mother   . Arthritis Paternal Grandmother   . Heart disease Paternal Grandmother   . Hypertension Father        Fathers family most  . Prostate cancer Father   . Kidney disease Father   . Colon polyps Sister   . Multiple myeloma Sister   . Heart disease Paternal Uncle   . Kidney disease Sister   . Sarcoidosis Neg Hx   . Other Neg Hx        osteoporosis  . Colon cancer Neg Hx   . Esophageal cancer Neg Hx   . Rectal cancer Neg Hx   . Stomach cancer Neg Hx      Social History   Socioeconomic History  . Marital status: Single    Spouse name: Not on file  . Number of children: 0  . Years of education: Not on file  . Highest education level: Not on file  Occupational History  . Occupation: Retired    Fish farm manager: [employer]  Tobacco Use  . Smoking status: Never Smoker  . Smokeless tobacco: Never Used  Vaping Use  . Vaping Use: Never used  Substance and Sexual Activity  . Alcohol use: No    Alcohol/week: 0.0 standard drinks  . Drug use: No  . Sexual activity: Not Currently  Other Topics Concern  . Not on file  Social History Narrative   Regular Exercise -  NO   Social Determinants of Health   Financial Resource Strain:   . Difficulty of Paying Living Expenses: Not on file  Food Insecurity:   . Worried About Charity fundraiser in the Last Year: Not on file  . Ran Out of Food in the Last Year: Not on file  Transportation Needs:   . Lack of Transportation (Medical): Not on file  . Lack of Transportation  (Non-Medical): Not on file  Physical Activity:   . Days of Exercise per Week: Not on file  . Minutes of Exercise per Session: Not on file  Stress:   . Feeling  of Stress : Not on file  Social Connections:   . Frequency of Communication with Friends and Family: Not on file  . Frequency of Social Gatherings with Friends and Family: Not on file  . Attends Religious Services: Not on file  . Active Member of Clubs or Organizations: Not on file  . Attends Archivist Meetings: Not on file  . Marital Status: Not on file  Intimate Partner Violence:   . Fear of Current or Ex-Partner: Not on file  . Emotionally Abused: Not on file  . Physically Abused: Not on file  . Sexually Abused: Not on file     BP 118/72   Pulse 62   Ht _0  (1.702 m)   Wt 182 lb 6.4 oz (82.7 kg)   SpO2 92%   BMI 28.57 kg/m   Physical Exam:  Well appearing NAD HEENT: Unremarkable Neck:  No JVD, no thyromegally Lymphatics:  No adenopathy Back:  No CVA tenderness Lungs:  Clear HEART:  Regular rate rhythm, no murmurs, no rubs, no clicks Abd:  soft, positive bowel sounds, no organomegally, no rebound, no guarding Ext:  2 plus pulses, no edema, no cyanosis, no clubbing Skin:  No rashes no nodules Neuro:  CN II through XII intact, motor grossly intact  EKG  DEVICE  Normal device function.  See PaceArt for details.   Assess/Plan: 1. Atrial fib - his VR is well controlled. He will continue a strategy of rate control with his beta blocker. 2. HTN - his bp is well controlled.  3. Dyslipidemia - he will continue his crestor. 4. Diastolic heart failure - he has been taking torsemide daily but his gout has flared and I have asked him to try reducing his salt and fluid intake and taking the torsemide every other day.   Carleene Overlie Taivon Haroon,MD

## 2020-10-20 ENCOUNTER — Encounter: Payer: Self-pay | Admitting: Diagnostic Neuroimaging

## 2020-10-20 ENCOUNTER — Ambulatory Visit (INDEPENDENT_AMBULATORY_CARE_PROVIDER_SITE_OTHER): Payer: Medicare Other | Admitting: Diagnostic Neuroimaging

## 2020-10-20 ENCOUNTER — Other Ambulatory Visit: Payer: Self-pay

## 2020-10-20 VITALS — BP 153/87 | HR 57 | Ht 67.0 in | Wt 182.0 lb

## 2020-10-20 DIAGNOSIS — G5622 Lesion of ulnar nerve, left upper limb: Secondary | ICD-10-CM

## 2020-10-20 NOTE — Progress Notes (Signed)
[facility]  PATIENT: Richard Blake DOB: June 16, 1946  REFERRING CLINICIAN: Janith Lima, MD HISTORY FROM: patient  REASON FOR VISIT: new consult    HISTORICAL  CHIEF COMPLAINT:  Chief Complaint  Patient presents with  . Atrophy of left hand muscles    rm 7 New Pt "previous pt of Dr Love/Dr Leta Baptist, wanted to get checked out again"     HISTORY OF PRESENT ILLNESS:   74 year old male here for evaluation of hand muscle atrophy.  Patient previous seen by neurologist Dr. Erling Cruz, last in 2013.  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  This was confirmed by spinal cord biopsy in 1996.  Also has degenerative disc disease.  Has had numbness in hands feet and legs for many years.  Pulmonary sarcoidosis was treated and now in remission since approximately 2010. Last MRI cervical spine in 2014 showed moderate spinal stenosis and myelomalacia at C5-6 and C6-7.  Over the past 9 to 10 months patient has had more problems with his bilateral hands.  He has noted muscle atrophy in the left hand.  He had a left hand wrist fracture in 2016 after a fall.  He has been following up with chiropractic treatments.  Due to progressive hand weakness, patient requested follow-up in neurology clinic.   REVIEW OF SYSTEMS: Full 14 system review of systems performed and negative with exception of: As per HPI.  ALLERGIES: Allergies  Allergen Reactions  . Amlodipine Other (See Comments)    Edema   . Welchol [Colesevelam Hcl] Other (See Comments)    Stomach cramps  . Other Swelling    Hair dye - facial swelling   . Prolia [Denosumab] Other (See Comments)    Pain at injection sight    HOME MEDICATIONS: Outpatient Medications Prior to Visit  Medication Sig Dispense Refill  . acetaminophen (TYLENOL) 500 MG tablet Take 500-1,000 mg by mouth every 6 (six) hours as needed (for pain from muscle spasms).    Marland Kitchen  allopurinol (ZYLOPRIM) 100 MG tablet Take 1 tablet (100 mg total) by mouth daily. 90 tablet 0  . Cholecalciferol 50 MCG (2000 UT) TABS Take 1 tablet (2,000 Units total) by mouth daily. 90 tablet 1  . Colchicine (MITIGARE) 0.6 MG CAPS Take 1 tablet by mouth 2 (two) times daily. 60 capsule 5  . Dietary Management Product (FOSTEUM PLUS) CAPS Take 1 capsule by mouth 2 (two) times daily. 60 capsule 11  . ferrous sulfate 325 (65 FE) MG tablet Take 1 tablet (325 mg total) by mouth 3 (three) times daily with meals. 90 tablet 2  . finasteride (PROSCAR) 5 MG tablet TAKE 1 TABLET(5 MG) BY MOUTH DAILY 90 tablet 1  . losartan (COZAAR) 100 MG tablet TAKE 1 TABLET BY MOUTH EVERY DAY 90 tablet 1  . metoprolol tartrate (LOPRESSOR) 25 MG tablet Take 1 tablet (25 mg total) by mouth 2 (two) times daily. 180 tablet 1  . pantoprazole (PROTONIX) 40 MG tablet Take 1 tablet (40 mg total) by mouth daily. 90 tablet 1  . prednisoLONE acetate (PRED FORTE) 1 % ophthalmic suspension Instill 1 drop into affected eye four times a day  for 5 days, then stop.    . rosuvastatin (CRESTOR) 10 MG tablet Take 1 tablet (10 mg total) by mouth daily. 90 tablet 1  . SYSTANE ULTRA 0.4-0.3 % SOLN SMARTSIG:1 Drop(s) In Eye(s) As Needed    . torsemide (DEMADEX) 20 MG tablet TAKE 1 TABLET(20 MG)  BY MOUTH DAILY 90 tablet 0  . vitamin B-12 (CYANOCOBALAMIN) 250 MCG tablet Take 250 mcg by mouth daily.      Alveda Reasons 15 MG TABS tablet TAKE 1 TABLET BY MOUTH EVERY DAY WITH SUPPER 90 tablet 1   No facility-administered medications prior to visit.    PAST MEDICAL HISTORY: Past Medical History:  Diagnosis Date  . A-fib ([place]) 11/2016   Tx with po meds at this time  . Anemia    Microcytic with Fe Sat 4% 10/31/10  . Atypical chest pain   . Carpal tunnel syndrome, left   . Cataract    removed bilaterally  . Chronic rhinitis   . Cough   . Diverticulosis   . Dyspnea    " WITH MY AFIB "  . GERD (gastroesophageal reflux disease)   . Hiatal  hernia   . History of recent blood transfusion 06/05/2018  . HLD (hyperlipidemia)   . HTN (hypertension)   . Interstitial lung disease ([place])   . Kidney stones   . Osteoarthritis    left hand  . Osteoporosis 2014  . Sarcoidosis 8/99   Transbronchial biopsy, taking daily prednisone since  November 08 on and off since 1999 w/cough every time he relapses off it for more than several weeks  . Undescended right testicle    "shrivled" in childhood from 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    PAST SURGICAL HISTORY: Past Surgical History:  Procedure Laterality Date  . CARDIOVERSION N/A 12/21/2016   Procedure: CARDIOVERSION;  Surgeon: Evans Lance, MD;  Location: [facility];  Service: Cardiovascular;  Laterality: N/A;  . CARDIOVERSION N/A 01/31/2017   Procedure: CARDIOVERSION;  Surgeon: Pixie Casino, MD;  Location[facility] ENDOSCOPY;  Service: Cardiovascular;  Laterality: N/A;  . CATARACT EXTRACTION Left 10/14/2013  . CATARACT EXTRACTION Right 11/24/2013  . CERVICAL FUSION     pt states he did not have a cervical fusion, just a biopsy   . COLONOSCOPY  01/12/2006   tics only   . ENDOVENOUS ABLATION SAPHENOUS VEIN W/ LASER Left 10/23/2018   endovenous laser ablation left greater saphenous vein by Ruta Hinds MD   . ENDOVENOUS ABLATION SAPHENOUS VEIN W/ LASER Right 12/04/2018   endovenous laser ablation right greater saphenous vein by Ruta Hinds MD   . [place]     right  . LIPOMA EXCISION Left 09/16/2004   forearm  . spinal cord biopsyOther]  1996   sarcoid    FAMILY HISTORY: Family History  Problem Relation Age of Onset  . Asthma Mother   . Diabetes Mother   . Arthritis Paternal Grandmother   . Heart disease Paternal Grandmother   . Hypertension Father        Fathers family most  . Prostate cancer Father   . Kidney disease Father   . Colon polyps Sister   . Multiple myeloma Sister   . Heart disease Paternal Uncle   . Kidney disease Sister   . Sarcoidosis Neg Hx   . Other  Neg Hx        osteoporosis  . Colon cancer Neg Hx   . Esophageal cancer Neg Hx   . Rectal cancer Neg Hx   . Stomach cancer Neg Hx     SOCIAL HISTORY: Social History   Socioeconomic History  . Marital status: Single    Spouse name: Not on file  . Number of children: 0  . Years of education: Not on file  . Highest education level: Bachelor's degree (e.g., BA,  AB, BS)  Occupational History  . Occupation: Retired    Fish farm manager: [employer]  Tobacco Use  . Smoking status: Never Smoker  . Smokeless tobacco: Never Used  Vaping Use  . Vaping Use: Never used  Substance and Sexual Activity  . Alcohol use: No    Alcohol/week: 0.0 standard drinks  . Drug use: No  . Sexual activity: Not Currently  Other Topics Concern  . Not on file  Social History Narrative   Lives alone   Regular Exercise -  NO   Social Determinants of Health   Financial Resource Strain:   . Difficulty of Paying Living Expenses: Not on file  Food Insecurity:   . Worried About Charity fundraiser in the Last Year: Not on file  . Ran Out of Food in the Last Year: Not on file  Transportation Needs:   . Lack of Transportation (Medical): Not on file  . Lack of Transportation (Non-Medical): Not on file  Physical Activity:   . Days of Exercise per Week: Not on file  . Minutes of Exercise per Session: Not on file  Stress:   . Feeling of Stress : Not on file  Social Connections:   . Frequency of Communication with Friends and Family: Not on file  . Frequency of Social Gatherings with Friends and Family: Not on file  . Attends Religious Services: Not on file  . Active Member of Clubs or Organizations: Not on file  . Attends Archivist Meetings: Not on file  . Marital Status: Not on file  Intimate Partner Violence:   . Fear of Current or Ex-Partner: Not on file  . Emotionally Abused: Not on file  . Physically Abused: Not on file  . Sexually Abused: Not on file     PHYSICAL EXAM  GENERAL  EXAM/CONSTITUTIONAL: Vitals:  Vitals:   10/20/20 0949  BP: (!) 153/87  Pulse: (!) 57  Weight: 182 lb (82.6 kg)  Height: _0  (1.702 m)     Body mass index is 28.51 kg/m. Wt Readings from Last 3 Encounters:  10/20/20 182 lb (82.6 kg)  10/13/20 182 lb 6.4 oz (82.7 kg)  08/11/20 178 lb (80.7 kg)     Patient is in no distress; well developed, nourished and groomed; neck is supple  CARDIOVASCULAR:  Examination of carotid arteries is normal; no carotid bruits  Regular rate and rhythm, no murmurs  Examination of peripheral vascular system by observation and palpation is normal  EYES:  Ophthalmoscopic exam of optic discs and posterior segments is normal; no papilledema or hemorrhages  No exam data present  MUSCULOSKELETAL:  Gait, strength, tone, movements noted in Neurologic exam below  NEUROLOGIC: MENTAL STATUS:  MMSE - Mini Mental State Exam 09/13/2017  Orientation to time 5  Orientation to Place 5  Registration 3  Attention/ Calculation 5  Recall 2  Language- name 2 objects 2  Language- repeat 1  Language- follow 3 step command 3  Language- read & follow direction 1  Write a sentence 1  Copy design 1  Total score 29    awake, alert, oriented to person, place and time  recent and remote memory intact  normal attention and concentration  language fluent, comprehension intact, naming intact  fund of knowledge appropriate  CRANIAL NERVE:   2nd - no papilledema on fundoscopic exam  2nd, 3rd, 4th, 6th - pupils equal and reactive to light, visual fields full to confrontation, extraocular muscles intact, no nystagmus  5th - facial  sensation symmetric  7th - facial strength symmetric  8th - hearing intact  9th - palate elevates symmetrically, uvula midline  11th - shoulder shrug symmetric  12th - tongue protrusion midline  MOTOR:   normal bulk and tone, full strength in the BUE, BLE; EXCEPT LEFT HAND INTRINSIC MUSCLE ATROPHY AND WEAKNESS OF  FINGER ABDUCTION, APB, THUMB OPPOSITION, LUMBRICALS.   SENSORY:   normal and symmetric to light touch, temperature, vibration; EXCEPT DECR IN LEFT HAND (throughout)  COORDINATION:   finger-nose-finger, fine finger movements normal  REFLEXES:   deep tendon reflexes --> BUE 1+; BLE 2+  GAIT/STATION:   narrow based gait     DIAGNOSTIC DATA (LABS, IMAGING, TESTING) - I reviewed patient records, labs, notes, testing and imaging myself where available.  Lab Results  Component Value Date   WBC 3.4 (L) 08/11/2020   HGB 14.5 08/11/2020   HCT 46.8 08/11/2020   MCV 86.3 08/11/2020   PLT 171 08/11/2020      Component Value Date/Time   NA 141 08/11/2020 1426   NA 143 02/08/2017 0910   K 4.2 08/11/2020 1426   CL 102 08/11/2020 1426   CO2 30 08/11/2020 1426   GLUCOSE 81 08/11/2020 1426   BUN 17 08/11/2020 1426   BUN 29 (H) 02/08/2017 0910   CREATININE 1.66 (H) 08/11/2020 1426   CALCIUM 9.4 08/11/2020 1426   PROT 7.4 04/22/2018 1419   ALBUMIN 4.0 04/22/2018 1419   AST 15 04/22/2018 1419   ALT 15 04/22/2018 1419   ALKPHOS 85 04/22/2018 1419   BILITOT 1.0 04/22/2018 1419   GFRNONAA 41 (L) 08/11/2020 1426   GFRAA 47 (L) 08/11/2020 1426   Lab Results  Component Value Date   CHOL 196 08/11/2020   HDL 53 08/11/2020   LDLCALC 120 (H) 08/11/2020   LDLDIRECT 186.5 10/23/2012   TRIG 118 08/11/2020   CHOLHDL 3.7 08/11/2020   No results found for: HGBA1C Lab Results  Component Value Date   VITAMINB12 559 08/19/2019   Lab Results  Component Value Date   TSH 0.94 08/11/2020    01/16/13 MRI cervical spine [I reviewed images myself and agree with interpretation. -VRP]   -At C5-6 disc bulging and facet hypertrophy with moderate spinal stenosis and severe biforaminal stenosis; posterior and central myelomalacia -At C6-7 disc bulging and facet hypertrophy with severe biforaminal stenosis; posterior and central myelomalacia -At C4-5 severe biforaminal stenosis -Stable from  7/70/08.    ASSESSMENT AND PLAN  74 y.o. year old male here with long history (since 1990's) of cervical myelomalacia and lymphocytic myelopathy, pulmonary sarcoidosis, with worsening left hand weakness and numbness since 2021.  Dx:  1. Ulnar neuropathy of left upper extremity     PLAN:  LEFT C8-T1 radiculopathy vs left ulnar neuropathy (since 2021; h/o traumatic left radius fracture in 2016; exam suggests C8-T1 radiculopathy because lumbricals and opponens pollicis also are noted to be weak)  - check EMG/NCS - consider MRI cervical spine (h/o cervical myelopathy and cervical radiculopathy); however patient reluctant to pursue surgery for cervical spine issues  Orders Placed This Encounter  Procedures  . NCV with EMG(electromyography)   Return for for NCV/EMG.    Penni Bombard, MD 44/07/6758, 16:38 AM Certified in Neurology, Neurophysiology and Neuroimaging  [facility] [address], [place], [place] [address] [phone]

## 2020-10-23 ENCOUNTER — Encounter: Payer: Self-pay | Admitting: Diagnostic Neuroimaging

## 2020-11-18 ENCOUNTER — Ambulatory Visit (INDEPENDENT_AMBULATORY_CARE_PROVIDER_SITE_OTHER): Payer: Medicare Other | Admitting: Diagnostic Neuroimaging

## 2020-11-18 ENCOUNTER — Encounter (INDEPENDENT_AMBULATORY_CARE_PROVIDER_SITE_OTHER): Payer: Medicare Other | Admitting: Diagnostic Neuroimaging

## 2020-11-18 DIAGNOSIS — G5622 Lesion of ulnar nerve, left upper limb: Secondary | ICD-10-CM | POA: Diagnosis not present

## 2020-11-18 DIAGNOSIS — Z0289 Encounter for other administrative examinations: Secondary | ICD-10-CM

## 2020-11-19 NOTE — Procedures (Signed)
[facility]  NCS (NERVE CONDUCTION STUDY) WITH EMG (ELECTROMYOGRAPHY) REPORT   STUDY DATE: 11/18/20 PATIENT NAME: Richard Blake DOB: January 29, 1946 MRN: [id number]  ORDERING CLINICIAN: Andrey Spearman, MD   TECHNOLOGIST: Sherre Scarlet ELECTROMYOGRAPHER: Earlean Polka. Ellaree Gear, MD  CLINICAL INFORMATION: 75 year old male with left hand pain and numbness.  History of cervical spinal stenosis, cervical radiculopathies.   FINDINGS: NERVE CONDUCTION STUDY:  Bilateral median motor responses have prolonged distal latencies, normal amplitudes, normal conduction velocities.    Left ulnar motor response has prolonged distal latency, decreased amplitude, absent response with stimulation below the elbow.  Right ulnar motor response is normal.  Bilateral median sensory responses have prolonged peak latencies and decreased amplitudes.  Left ulnar sensory response cannot be obtained.  Right ulnar sensory response is normal.  Bilateral ulnar F-wave latencies are prolonged.    NEEDLE ELECTROMYOGRAPHY:  Needle examination of left upper extremity shows reduced motor recruitment in left triceps, left flexor carpi ulnaris, left first dorsal interosseous.  Positive sharp waves and fibrillation potentials noted in left flexor carpi ulnaris.  Cervical paraspinal muscles deferred as patient on anticoagulation.   IMPRESSION:   Abnormal study demonstrating: -Severe left ulnar neuropathy throughout its course. -Moderate bilateral median neuropathies at the wrists consistent with moderate bilateral carpal tunnel syndrome. -Chronic denervation of left triceps brachii, may be related to C7 radiculopathy.    INTERPRETING PHYSICIAN:  Penni Bombard, MD Certified in Neurology, Neurophysiology and Neuroimaging  [facility] [address] [place], [place] [address] [phone]   [facility]    Nerve / Sites Muscle Latency Ref. Amplitude Ref. Rel Amp  Segments Distance Velocity Ref. Area    ms ms mV mV %  cm m/s m/s mVms  L Median - APB     Wrist APB 6.9 ?4.4 6.6 ?4.0 100 Wrist - APB 7   32.3     Upper arm APB 11.6  6.6  100 Upper arm - Wrist 23 49 ?49 30.6  R Median - APB     Wrist APB 5.7 ?4.4 8.0 ?4.0 100 Wrist - APB 7   34.6     Upper arm APB 10.3  7.8  97.7 Upper arm - Wrist 24 53 ?49 35.0  L Ulnar - ADM     Wrist ADM 4.6 ?3.3 0.3 ?6.0 100 Wrist - ADM 7   1.1     B.Elbow ADM NR  NR  NR B.Elbow - Wrist 23 NR ?49 NR     A.Elbow ADM 13.7  0.7   A.Elbow - B.Elbow 10 NR ?49 5.1  R Ulnar - ADM     Wrist ADM 3.3 ?3.3 10.5 ?6.0 100 Wrist - ADM 7   33.3     B.Elbow ADM 7.2  9.5  89.9 B.Elbow - Wrist 22 56 ?49 31.7     A.Elbow ADM 9.2  9.6  101 A.Elbow - B.Elbow 10 51 ?49 32.7             SNC    Nerve / Sites Rec. Site Peak Lat Ref.  Amp Ref. Segments Distance    ms ms V V  cm  L Median - Orthodromic (Dig II, Mid palm)     Dig II Wrist 5.5 ?3.4 4 ?10 Dig II - Wrist 13  R Median - Orthodromic (Dig II, Mid palm)     Dig II Wrist 5.5 ?3.4 6 ?10 Dig II - Wrist 13  L Ulnar - Orthodromic, (Dig V, Mid  palm)     Dig V Wrist NR ?3.1 NR ?5 Dig V - Wrist 11  R Ulnar - Orthodromic, (Dig V, Mid palm)     Dig V Wrist 3.1 ?3.1 6 ?5 Dig V - Wrist 53             F  Wave    Nerve F Lat Ref.   ms ms  L Ulnar - ADM 49.0 ?32.0  R Ulnar - ADM 33.7 ?32.0         EMG Summary Table    Spontaneous MUAP Recruitment  Muscle IA Fib PSW Fasc Other Amp Dur. Poly Pattern  L. Deltoid Normal None None None _______ Normal Normal Normal Normal  L. Biceps brachii Normal None None None _______ Normal Normal Normal Normal  L. Triceps brachii Normal None None None _______ Increased Normal Normal Reduced  L. Flexor carpi radialis Normal None None None _______ Normal Normal Normal Normal  L. Flexor carpi ulnaris Increased 1+ 1+ None _______ Increased Normal Normal Reduced  L. First dorsal interosseous Normal None None None _______ Decreased Normal Normal Reduced

## 2020-12-10 ENCOUNTER — Other Ambulatory Visit: Payer: Self-pay | Admitting: Internal Medicine

## 2020-12-10 ENCOUNTER — Other Ambulatory Visit: Payer: Self-pay | Admitting: Physician Assistant

## 2020-12-10 DIAGNOSIS — I10 Essential (primary) hypertension: Secondary | ICD-10-CM

## 2020-12-10 DIAGNOSIS — I4819 Other persistent atrial fibrillation: Secondary | ICD-10-CM

## 2020-12-10 DIAGNOSIS — K449 Diaphragmatic hernia without obstruction or gangrene: Secondary | ICD-10-CM

## 2020-12-10 DIAGNOSIS — M1A39X0 Chronic gout due to renal impairment, multiple sites, without tophus (tophi): Secondary | ICD-10-CM

## 2020-12-29 DIAGNOSIS — Z961 Presence of intraocular lens: Secondary | ICD-10-CM | POA: Diagnosis not present

## 2020-12-29 DIAGNOSIS — Z9842 Cataract extraction status, left eye: Secondary | ICD-10-CM | POA: Diagnosis not present

## 2020-12-29 DIAGNOSIS — H52222 Regular astigmatism, left eye: Secondary | ICD-10-CM | POA: Diagnosis not present

## 2020-12-29 DIAGNOSIS — H26491 Other secondary cataract, right eye: Secondary | ICD-10-CM | POA: Diagnosis not present

## 2020-12-29 DIAGNOSIS — H18593 Other hereditary corneal dystrophies, bilateral: Secondary | ICD-10-CM | POA: Diagnosis not present

## 2020-12-29 DIAGNOSIS — H524 Presbyopia: Secondary | ICD-10-CM | POA: Diagnosis not present

## 2020-12-29 DIAGNOSIS — H5211 Myopia, right eye: Secondary | ICD-10-CM | POA: Diagnosis not present

## 2020-12-29 DIAGNOSIS — H43812 Vitreous degeneration, left eye: Secondary | ICD-10-CM | POA: Diagnosis not present

## 2020-12-29 DIAGNOSIS — H16223 Keratoconjunctivitis sicca, not specified as Sjogren's, bilateral: Secondary | ICD-10-CM | POA: Diagnosis not present

## 2021-01-24 ENCOUNTER — Ambulatory Visit (INDEPENDENT_AMBULATORY_CARE_PROVIDER_SITE_OTHER): Payer: Medicare Other

## 2021-01-24 ENCOUNTER — Encounter: Payer: Self-pay | Admitting: Internal Medicine

## 2021-01-24 ENCOUNTER — Other Ambulatory Visit: Payer: Self-pay

## 2021-01-24 ENCOUNTER — Ambulatory Visit (INDEPENDENT_AMBULATORY_CARE_PROVIDER_SITE_OTHER): Payer: Medicare Other | Admitting: Internal Medicine

## 2021-01-24 VITALS — BP 128/82 | HR 57 | Temp 98.2°F | Resp 16 | Ht 67.0 in | Wt 185.0 lb

## 2021-01-24 DIAGNOSIS — M1A39X0 Chronic gout due to renal impairment, multiple sites, without tophus (tophi): Secondary | ICD-10-CM | POA: Diagnosis not present

## 2021-01-24 DIAGNOSIS — I10 Essential (primary) hypertension: Secondary | ICD-10-CM

## 2021-01-24 DIAGNOSIS — I517 Cardiomegaly: Secondary | ICD-10-CM | POA: Diagnosis not present

## 2021-01-24 DIAGNOSIS — R059 Cough, unspecified: Secondary | ICD-10-CM | POA: Insufficient documentation

## 2021-01-24 DIAGNOSIS — K449 Diaphragmatic hernia without obstruction or gangrene: Secondary | ICD-10-CM | POA: Diagnosis not present

## 2021-01-24 DIAGNOSIS — D869 Sarcoidosis, unspecified: Secondary | ICD-10-CM

## 2021-01-24 DIAGNOSIS — N183 Chronic kidney disease, stage 3 unspecified: Secondary | ICD-10-CM | POA: Diagnosis not present

## 2021-01-24 DIAGNOSIS — J411 Mucopurulent chronic bronchitis: Secondary | ICD-10-CM | POA: Diagnosis not present

## 2021-01-24 DIAGNOSIS — I4819 Other persistent atrial fibrillation: Secondary | ICD-10-CM

## 2021-01-24 LAB — CBC WITH DIFFERENTIAL/PLATELET
Basophils Absolute: 0 10*3/uL (ref 0.0–0.1)
Basophils Relative: 1.1 % (ref 0.0–3.0)
Eosinophils Absolute: 0.2 10*3/uL (ref 0.0–0.7)
Eosinophils Relative: 5.6 % — ABNORMAL HIGH (ref 0.0–5.0)
HCT: 44.5 % (ref 39.0–52.0)
Hemoglobin: 14.5 g/dL (ref 13.0–17.0)
Lymphocytes Relative: 23.7 % (ref 12.0–46.0)
Lymphs Abs: 0.9 10*3/uL (ref 0.7–4.0)
MCHC: 32.6 g/dL (ref 30.0–36.0)
MCV: 85.5 fl (ref 78.0–100.0)
Monocytes Absolute: 0.4 10*3/uL (ref 0.1–1.0)
Monocytes Relative: 11.2 % (ref 3.0–12.0)
Neutro Abs: 2.3 10*3/uL (ref 1.4–7.7)
Neutrophils Relative %: 58.4 % (ref 43.0–77.0)
Platelets: 160 10*3/uL (ref 150.0–400.0)
RBC: 5.2 Mil/uL (ref 4.22–5.81)
RDW: 14.9 % (ref 11.5–15.5)
WBC: 3.9 10*3/uL — ABNORMAL LOW (ref 4.0–10.5)

## 2021-01-24 LAB — URIC ACID: Uric Acid, Serum: 8.2 mg/dL — ABNORMAL HIGH (ref 4.0–7.8)

## 2021-01-24 LAB — BASIC METABOLIC PANEL
BUN: 22 mg/dL (ref 6–23)
CO2: 32 mEq/L (ref 19–32)
Calcium: 9.5 mg/dL (ref 8.4–10.5)
Chloride: 102 mEq/L (ref 96–112)
Creatinine, Ser: 1.9 mg/dL — ABNORMAL HIGH (ref 0.40–1.50)
GFR: 34.6 mL/min — ABNORMAL LOW (ref >60.00)
Glucose, Bld: 89 mg/dL (ref 70–99)
Potassium: 3.9 mEq/L (ref 3.5–5.1)
Sodium: 139 mEq/L (ref 135–145)

## 2021-01-24 LAB — C-REACTIVE PROTEIN: CRP: <1 mg/dL (ref 0.5–20.0)

## 2021-01-24 MED ORDER — RIVAROXABAN 15 MG PO TABS: 15.0000 mg | ORAL_TABLET | Freq: Every day | ORAL | 1 refills | Status: DC

## 2021-01-24 NOTE — Patient Instructions (Signed)

## 2021-01-24 NOTE — Progress Notes (Signed)
Subjective:  Patient ID: Richard Blake, male    DOB: 03-05-1946  Age: 75 y.o. MRN: [id number]  CC: Cough and Hypertension  This visit occurred during the SARS-CoV-2 public health emergency.  Safety protocols were in place, including screening questions prior to the visit, additional usage of staff PPE, and extensive cleaning of exam room while observing appropriate contact time as indicated for disinfecting solutions.    HPI Richard Blake presents for f/up -   He complains of a 69-month history of cough that increases at night.  The cough is worsened over the last 6 weeks.  He has rare wheezing and phlegm that is light yellow.  He denies shortness of breath, chest pain, hemoptysis, fever, or chills.  Outpatient Medications Prior to Visit  Medication Sig Dispense Refill  . acetaminophen (TYLENOL) 500 MG tablet Take 500-1,000 mg by mouth every 6 (six) hours as needed (for pain from muscle spasms).    . Cholecalciferol 50 MCG (2000 UT) TABS Take 1 tablet (2,000 Units total) by mouth daily. 90 tablet 1  . Colchicine (MITIGARE) 0.6 MG CAPS Take 1 tablet by mouth 2 (two) times daily. 60 capsule 5  . Dietary Management Product (FOSTEUM PLUS) CAPS Take 1 capsule by mouth 2 (two) times daily. 60 capsule 11  . ferrous sulfate 325 (65 FE) MG tablet Take 1 tablet (325 mg total) by mouth 3 (three) times daily with meals. 90 tablet 2  . finasteride (PROSCAR) 5 MG tablet TAKE 1 TABLET(5 MG) BY MOUTH DAILY 90 tablet 1  . losartan (COZAAR) 100 MG tablet TAKE 1 TABLET BY MOUTH EVERY DAY 90 tablet 2  . metoprolol tartrate (LOPRESSOR) 25 MG tablet TAKE 1 TABLET(25 MG) BY MOUTH TWICE DAILY 180 tablet 1  . pantoprazole (PROTONIX) 40 MG tablet TAKE 1 TABLET(40 MG) BY MOUTH DAILY 90 tablet 1  . prednisoLONE acetate (PRED FORTE) 1 % ophthalmic suspension Instill 1 drop into affected eye four times a day  for 5 days, then stop.    . rosuvastatin (CRESTOR) 10 MG tablet Take 1 tablet (10 mg total) by mouth  daily. 90 tablet 1  . SYSTANE ULTRA 0.4-0.3 % SOLN SMARTSIG:1 Drop(s) In Eye(s) As Needed    . torsemide (DEMADEX) 20 MG tablet TAKE 1 TABLET(20 MG) BY MOUTH DAILY 90 tablet 0  . vitamin B-12 (CYANOCOBALAMIN) 250 MCG tablet Take 250 mcg by mouth daily.    Marland Kitchen allopurinol (ZYLOPRIM) 100 MG tablet TAKE 1 TABLET(100 MG) BY MOUTH DAILY 90 tablet 0  . XARELTO 15 MG TABS tablet TAKE 1 TABLET BY MOUTH EVERY DAY WITH SUPPER 90 tablet 1   No facility-administered medications prior to visit.    ROS Review of Systems  Constitutional: Negative for appetite change, chills, diaphoresis, fatigue and fever.  HENT: Negative.  Negative for sore throat.   Eyes: Negative for visual disturbance.  Respiratory: Positive for cough and wheezing.   Cardiovascular: Negative for chest pain, palpitations and leg swelling.  Gastrointestinal: Negative for abdominal pain, constipation, diarrhea, nausea and vomiting.  Endocrine: Negative.   Genitourinary: Negative.  Negative for difficulty urinating.  Musculoskeletal: Negative.   Skin: Negative.  Negative for color change, pallor and rash.  Allergic/Immunologic: Negative.   Neurological: Negative.   Hematological: Negative for adenopathy. Does not bruise/bleed easily.  Psychiatric/Behavioral: Negative.     Objective:  BP 128/82   Pulse (!) 57   Temp 98.2 F (36.8 C) (Oral)   Resp 16   Ht 5\' 7"  (1.702 m)  Wt 185 lb (83.9 kg)   SpO2 94%   BMI 28.98 kg/m   BP Readings from Last 3 Encounters:  01/24/21 128/82  10/20/20 (!) 153/87  10/13/20 118/72    Wt Readings from Last 3 Encounters:  01/24/21 185 lb (83.9 kg)  10/20/20 182 lb (82.6 kg)  10/13/20 182 lb 6.4 oz (82.7 kg)    Physical Exam Vitals reviewed.  Constitutional:      General: He is not in acute distress.    Appearance: Normal appearance. He is not ill-appearing, toxic-appearing or diaphoretic.  HENT:     Nose: Nose normal.     Mouth/Throat:     Mouth: Mucous membranes are moist.   Eyes:     General: No scleral icterus.    Conjunctiva/sclera: Conjunctivae normal.  Cardiovascular:     Rate and Rhythm: Normal rate. Rhythm irregularly irregular.     Heart sounds: No murmur heard.   Pulmonary:     Effort: Pulmonary effort is normal.     Breath sounds: No stridor. No wheezing, rhonchi or rales.  Abdominal:     General: Abdomen is flat.     Palpations: There is no mass.     Tenderness: There is no abdominal tenderness. There is no guarding.     Hernia: No hernia is present.  Musculoskeletal:        General: Normal range of motion.     Cervical back: Neck supple.     Right lower leg: No edema.     Left lower leg: No edema.  Lymphadenopathy:     Cervical: No cervical adenopathy.  Skin:    General: Skin is warm and dry.     Coloration: Skin is not pale.  Neurological:     General: No focal deficit present.     Mental Status: He is alert.  Psychiatric:        Mood and Affect: Mood normal.        Behavior: Behavior normal.     Lab Results  Component Value Date   WBC 3.9 (L) 01/24/2021   HGB 14.5 01/24/2021   HCT 44.5 01/24/2021   PLT 160.0 01/24/2021   GLUCOSE 89 01/24/2021   CHOL 196 08/11/2020   TRIG 118 08/11/2020   HDL 53 08/11/2020   LDLDIRECT 186.5 10/23/2012   LDLCALC 120 (H) 08/11/2020   ALT 15 04/22/2018   AST 15 04/22/2018   NA 139 01/24/2021   K 3.9 01/24/2021   CL 102 01/24/2021   CREATININE 1.90 (H) 01/24/2021   BUN 22 01/24/2021   CO2 32 01/24/2021   TSH 0.94 08/11/2020   PSA 2.32 01/22/2020   INR 1.1 12/14/2016    VAS [place] LOWER EXTREMITY VENOUS POST ABLATION  Result Date: 12/11/2018  Lower Venous Study Indications: Pain.  Risk Factors: Surgery Follow up post right great saphenous vein ablation 12-04-2018. Performing Technologist: Delorise Shiner RVT  Examination Guidelines: A complete evaluation includes B-mode imaging, spectral Doppler, color Doppler, and power Doppler as needed of all accessible portions of each vessel.  Bilateral testing is considered an integral part of a complete examination. Limited examinations for reoccurring indications may be performed as noted.  Right Venous Findings: +---+---------------+---------+-----------+--------------------+-------+    CompressibilityPhasicitySpontaneityProperties          Summary +---+---------------+---------+-----------+--------------------+-------+ CFVFull                    Yes                                    +---+---------------+---------+-----------+--------------------+-------+  SFJPartial                                                        +---+---------------+---------+-----------+--------------------+-------+ GSVNone                               spongy w/compression        +---+---------------+---------+-----------+--------------------+-------+ 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  Left Venous Findings: +---+---------------+---------+-----------+----------+-------+    CompressibilityPhasicitySpontaneityPropertiesSummary +---+---------------+---------+-----------+----------+-------+ CFVFull                    Yes                          +---+---------------+---------+-----------+----------+-------+    Summary: Right: No evidence of common femoral vein obstruction. Successful vein closure. Left: No evidence of common femoral vein obstruction.  *See table(s) above for measurements and observations. Electronically signed by Ruta Hinds MD on 12/11/2018 at 10:33:20 AM.    Final    No results found.  Assessment & Plan:   Richard Blake was seen today for cough and hypertension.  Diagnoses and all orders for this visit:  Essential hypertension-his BP is adequately well controlled. -     Basic metabolic panel; Future -     CBC with Differential/Platelet; Future -     CBC with Differential/Platelet -     Basic metabolic panel  CRI (chronic renal insufficiency), stage 3 (moderate)-his  renal function is stable. -     Basic metabolic panel; Future -     CBC with Differential/Platelet; Future -     CBC with Differential/Platelet -     Basic metabolic panel  Chronic gout due to renal impairment of multiple sites without tophus-his uric acid level is too high.  I recommended that he start taking a xanthine oxidase inhibitor. -     Basic metabolic panel; Future -     Uric acid; Future -     Uric acid -     Basic metabolic panel -     allopurinol (ZYLOPRIM) 100 MG tablet; Take 2 tablets (200 mg total) by mouth daily.  Persistent atrial fibrillation (HCC)-he has good rate control.  Will continue anticoagulation. -     Rivaroxaban (XARELTO) 15 MG TABS tablet; Take 1 tablet (15 mg total) by mouth daily with supper.  Sarcoidosis-based on his symptoms, labs, and x-ray I do not think he is having an exacerbation of sarcoid.  I think he has chronic bronchitis.  I recommended that he start using a LABA/LAMA inhaler. -     DG Chest 2 View; Future -     C-reactive protein; Future -     C-reactive protein  Cough -     DG Chest 2 View; Future -     C-reactive protein; Future -     C-reactive protein  Mucopurulent chronic bronchitis (HCC) -     Tiotropium Bromide-Olodaterol (STIOLTO RESPIMAT) 2.5-2.5 MCG/ACT AERS; Inhale 2 puffs into the lungs daily.   I have changed Richard Blake. Richard Blake Xarelto to Rivaroxaban. I have also changed his allopurinol. I am also having him start on Stiolto Respimat. Additionally, I am having him maintain his vitamin B-12, acetaminophen, Fosteum Plus, Cholecalciferol, ferrous sulfate, Colchicine, prednisoLONE acetate, Systane  Ultra, finasteride, rosuvastatin, losartan, metoprolol tartrate, torsemide, and pantoprazole.  Meds ordered this encounter  Medications  . Rivaroxaban (XARELTO) 15 MG TABS tablet    Sig: Take 1 tablet (15 mg total) by mouth daily with supper.    Dispense:  90 tablet    Refill:  1  . allopurinol (ZYLOPRIM) 100 MG tablet    Sig:  Take 2 tablets (200 mg total) by mouth daily.    Dispense:  180 tablet    Refill:  0  . Tiotropium Bromide-Olodaterol (STIOLTO RESPIMAT) 2.5-2.5 MCG/ACT AERS    Sig: Inhale 2 puffs into the lungs daily.    Dispense:  12 g    Refill:  1     Follow-up: Return in about 4 weeks (around 02/21/2021).  Scarlette Calico, MD

## 2021-01-26 ENCOUNTER — Encounter: Payer: Self-pay | Admitting: Internal Medicine

## 2021-01-26 MED ORDER — ALLOPURINOL 100 MG PO TABS: 200.0000 mg | ORAL_TABLET | Freq: Every day | ORAL | 0 refills | Status: DC

## 2021-01-27 ENCOUNTER — Telehealth: Payer: Self-pay | Admitting: Internal Medicine

## 2021-01-27 DIAGNOSIS — H0279 Other degenerative disorders of eyelid and periocular area: Secondary | ICD-10-CM | POA: Diagnosis not present

## 2021-01-27 DIAGNOSIS — H02831 Dermatochalasis of right upper eyelid: Secondary | ICD-10-CM | POA: Diagnosis not present

## 2021-01-27 DIAGNOSIS — H02423 Myogenic ptosis of bilateral eyelids: Secondary | ICD-10-CM | POA: Diagnosis not present

## 2021-01-27 DIAGNOSIS — H02834 Dermatochalasis of left upper eyelid: Secondary | ICD-10-CM | POA: Diagnosis not present

## 2021-01-27 NOTE — Telephone Encounter (Signed)
LVM for pt to rtn my call to schedule awv with nha . Please schedule if pt calls the office.  

## 2021-02-01 DIAGNOSIS — J411 Mucopurulent chronic bronchitis: Secondary | ICD-10-CM | POA: Insufficient documentation

## 2021-02-01 MED ORDER — STIOLTO RESPIMAT 2.5-2.5 MCG/ACT IN AERS: 2.0000 | INHALATION_SPRAY | Freq: Every day | RESPIRATORY_TRACT | 1 refills | Status: DC

## 2021-02-09 ENCOUNTER — Other Ambulatory Visit: Payer: Self-pay

## 2021-02-09 ENCOUNTER — Ambulatory Visit (INDEPENDENT_AMBULATORY_CARE_PROVIDER_SITE_OTHER): Payer: Medicare Other

## 2021-02-09 VITALS — BP 118/80 | HR 64 | Temp 98.2°F | Resp 16 | Ht 67.0 in | Wt 183.0 lb

## 2021-02-09 DIAGNOSIS — Z0000 Encounter for general adult medical examination without abnormal findings: Secondary | ICD-10-CM | POA: Diagnosis not present

## 2021-02-09 NOTE — Progress Notes (Signed)
Subjective:   Richard Blake is a 75 y.o. male who presents for Medicare Annual/Subsequent preventive examination.  Review of Systems    No ROS. Medicare Wellness Visit. Additional risk factors are reflected in social history. Cardiac Risk Factors include: advanced age (>42mn, >>73women);dyslipidemia;hypertension;male gender;family history of premature cardiovascular disease     Objective:    Today's Vitals   02/09/21 1241  BP: 118/80  Pulse: 64  Resp: 16  Temp: 98.2 F (36.8 C)  SpO2: 96%  Weight: 183 lb (83 kg)  Height: 5' 7" (1.702 m)  PainSc: 0-No pain   Body mass index is 28.66 kg/m.  Advanced Directives 02/09/2021 09/23/2019 09/17/2018 09/13/2017 01/29/2017 12/21/2016 07/10/2016  Does Patient Have a Medical Advance Directive? _0  No No  Would patient like information on creating a medical advance directive? No - Patient declined No - Patient declined Yes (ED - Information included in AVS) Yes (ED - Information included in AVS) No - Patient declined No - Patient declined -    Current Medications (verified) Outpatient Encounter Medications as of 02/09/2021  Medication Sig  . acetaminophen (TYLENOL) 500 MG tablet Take 500-1,000 mg by mouth every 6 (six) hours as needed (for pain from muscle spasms).  .Marland Kitchenallopurinol (ZYLOPRIM) 100 MG tablet Take 2 tablets (200 mg total) by mouth daily.  . Cholecalciferol 50 MCG (2000 UT) TABS Take 1 tablet (2,000 Units total) by mouth daily.  . Colchicine (MITIGARE) 0.6 MG CAPS Take 1 tablet by mouth 2 (two) times daily.  . Dietary Management Product (FOSTEUM PLUS) CAPS Take 1 capsule by mouth 2 (two) times daily.  . ferrous sulfate 325 (65 FE) MG tablet Take 1 tablet (325 mg total) by mouth 3 (three) times daily with meals.  . finasteride (PROSCAR) 5 MG tablet TAKE 1 TABLET(5 MG) BY MOUTH DAILY  . losartan (COZAAR) 100 MG tablet TAKE 1 TABLET BY MOUTH EVERY DAY  . metoprolol tartrate (LOPRESSOR) 25 MG tablet TAKE 1 TABLET(25  MG) BY MOUTH TWICE DAILY  . pantoprazole (PROTONIX) 40 MG tablet TAKE 1 TABLET(40 MG) BY MOUTH DAILY  . prednisoLONE acetate (PRED FORTE) 1 % ophthalmic suspension Instill 1 drop into affected eye four times a day  for 5 days, then stop.  . Rivaroxaban (XARELTO) 15 MG TABS tablet Take 1 tablet (15 mg total) by mouth daily with supper.  . rosuvastatin (CRESTOR) 10 MG tablet Take 1 tablet (10 mg total) by mouth daily.  . SYSTANE ULTRA 0.4-0.3 % SOLN SMARTSIG:1 Drop(s) In Eye(s) As Needed  . Tiotropium Bromide-Olodaterol (STIOLTO RESPIMAT) 2.5-2.5 MCG/ACT AERS Inhale 2 puffs into the lungs daily.  .Marland Kitchentorsemide (DEMADEX) 20 MG tablet TAKE 1 TABLET(20 MG) BY MOUTH DAILY  . vitamin B-12 (CYANOCOBALAMIN) 250 MCG tablet Take 250 mcg by mouth daily.   No facility-administered encounter medications on file as of 02/09/2021.    Allergies (verified) Amlodipine, Welchol [colesevelam hcl], Other, and Prolia [denosumab]   History: Past Medical History:  Diagnosis Date  . A-fib ([place] 11/2016   Tx with po meds at this time  . Anemia    Microcytic with Fe Sat 4% 10/31/10  . Atypical chest pain   . Carpal tunnel syndrome, left   . Cataract    removed bilaterally  . Chronic rhinitis   . Cough   . Diverticulosis   . Dyspnea    " WITH MY AFIB "  . GERD (gastroesophageal reflux disease)   . Hiatal hernia   .  History of recent blood transfusion 06/05/2018  . HLD (hyperlipidemia)   . HTN (hypertension)   . Interstitial lung disease ([place])   . Kidney stones   . Osteoarthritis    left hand  . Osteoporosis 2014  . Sarcoidosis 8/99   Transbronchial biopsy, taking daily prednisone since  November 08 on and off since 1999 w/cough every time he relapses off it for more than several weeks  . 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 CARDIOVERSION N/A 12/21/2016   Procedure: CARDIOVERSION;  Surgeon: Evans Lance,  MD;  Location: [facility];  Service: Cardiovascular;  Laterality: N/A;  . CARDIOVERSION N/A 01/31/2017   Procedure: CARDIOVERSION;  Surgeon: Pixie Casino, MD;  Location: [facility] END[facility]COPY;  Service: Cardiovascular;  Laterality: N/A;  . CATARACT EXTRACTION Left 10/14/2013  . CATARACT EXTRACTION Right 11/24/2013  . CERVICAL FUSION     pt states he did not have a cervical fusion, just a biopsy   . COLONOSCOPY  01/12/2006   tics only   . ENDOVENOUS ABLATION SAPHENOUS VEIN W/ LASER Left 10/23/2018   endovenous laser ablation left greater saphenous vein by Ruta Hinds MD   . ENDOVENOUS ABLATION SAPHENOUS VEIN W/ LASER Right 12/04/2018   endovenous laser ablation right greater saphenous vein by Ruta Hinds MD   . [place]     right  . LIPOMA EXCISION Left 09/16/2004   forearm  . spinal cord biopsyOther]  1996   sarcoid   Family History  Problem Relation Age of Onset  . Asthma Mother   . Diabetes Mother   . Arthritis Paternal Grandmother   . Heart disease Paternal Grandmother   . Hypertension Father        Fathers family most  . Prostate cancer Father   . Kidney disease Father   . Colon polyps Sister   . Multiple myeloma Sister   . Heart disease Paternal Uncle   . Kidney disease Sister   . Sarcoidosis Neg Hx   . Other Neg Hx        osteoporosis  . Colon cancer Neg Hx   . Esophageal cancer Neg Hx   . Rectal cancer Neg Hx   . Stomach cancer Neg Hx    Social History   Socioeconomic History  . Marital status: Single    Spouse name: Not on file  . Number of children: 0  . Years of education: Not on file  . Highest education level: Bachelor's degree (e.g., BA, AB, BS)  Occupational History  . Occupation: Retired    Fish farm manager: [employer]  Tobacco Use  . Smoking status: Never Smoker  . Smokeless tobacco: Never Used  Vaping Use  . Vaping Use: Never used  Substance and Sexual Activity  . Alcohol use: No    Alcohol/week: 0.0 standard drinks  . Drug  use: No  . Sexual activity: Not Currently  Other Topics Concern  . Not on file  Social History Narrative   Lives alone   Regular Exercise -  NO   Social Determinants of Health   Financial Resource Strain: Low Risk   . Difficulty of Paying Living Expenses: Not hard at all  Food Insecurity: No Food Insecurity  . Worried About Charity fundraiser in the Last Year: Never true  . Ran Out of Food in the Last Year: Never true  Transportation Needs: No Transportation Needs  . Lack of  Transportation (Medical): No  . Lack of Transportation (Non-Medical): No  Physical Activity: Insufficiently Active  . Days of Exercise per Week: 7 days  . Minutes of Exercise per Session: 20 min  Stress: No Stress Concern Present  . Feeling of Stress : Not at all  Social Connections: Moderately Integrated  . Frequency of Communication with Friends and Family: More than three times a week  . Frequency of Social Gatherings with Friends and Family: More than three times a week  . Attends Religious Services: More than 4 times per year  . Active Member of Clubs or Organizations: No  . Attends Archivist Meetings: More than 4 times per year  . Marital Status: Never married    Tobacco Counseling Counseling given: Not Answered   Clinical Intake:  Pre-visit preparation completed: Yes  Pain : No/denies pain Pain Score: 0-No pain     BMI - recorded: 28.66 Nutritional Status: BMI 25 -29 Overweight Nutritional Risks: None Diabetes: No  How often do you need to have someone help you when you read instructions, pamphlets, or other written materials from your doctor or pharmacy?: 1 - Never What is the last grade level you completed in school?: Bachelor's Degree  Diabetic? no  Interpreter Needed?: No  Information entered by :: Lisette Abu, LPN   Activities of Daily Living In your present state of health, do you have any difficulty performing the following activities: 02/09/2021 01/24/2021   Hearing? N N  Vision? N N  Difficulty concentrating or making decisions? N N  Walking or climbing stairs? N N  Dressing or bathing? N N  Doing errands, shopping? N N  Preparing Food and eating ? N -  Using the Toilet? N -  In the past six months, have you accidently leaked urine? N -  Do you have problems with loss of bowel control? N -  Managing your Medications? N -  Managing your Finances? N -  Housekeeping or managing your Housekeeping? N -  Some recent data might be hidden    Patient Care Team: Janith Lima, MD as PCP - General Evans Lance, MD as PCP - Cardiology (Cardiology) Love, Alyson Locket, MD (Neurology) Tanda Rockers, MD as Attending Physician (Pulmonary Disease) Evans Lance, MD as Consulting Physician (Cardiology) Corliss Parish, MD as Consulting Physician (Nephrology)  Indicate any recent Medical Services you may have received from other than [facility] providers in the past year (date may be approximate).     Assessment:   This is a routine wellness examination for [place].  Hearing/Vision screen No exam data present  Dietary issues and exercise activities discussed: Current Exercise Habits: Home exercise routine, Type of exercise: strength training/weights;stretching;walking, Time (Minutes): 25, Frequency (Times/Week): 7, Weekly Exercise (Minutes/Week): 175, Intensity: Mild, Exercise limited by: cardiac condition(s);respiratory conditions(s)  Goals    . Patient Stated     I do want to monitor what I eat closer to maintain my health.       Depression Screen PHQ 2/9 Scores 02/09/2021 01/24/2021 09/23/2019 08/19/2019 09/17/2018 04/23/2018 09/13/2017  PHQ - 2 Score 0 0 0 0 0 0 1  PHQ- 9 Score - - - - - - 2    Fall Risk Fall Risk  02/09/2021 01/24/2021 09/23/2019 08/19/2019 09/17/2018  Falls in the past year? 0 0 0 0 0  Comment - - - - -  Number falls in past yr: 0 - 0 0 -  Injury with Fall? 0 - 0 0 -  Risk for fall due to : No Fall Risks - - - -   Follow up Falls evaluation completed - - Falls evaluation completed -    FALL RISK PREVENTION PERTAINING TO THE HOME:  Any stairs in or around the home? No  If so, are there any without handrails? No  Home free of loose throw rugs in walkways, pet beds, electrical cords, etc? Yes  Adequate lighting in your home to reduce risk of falls? Yes   ASSISTIVE DEVICES UTILIZED TO PREVENT FALLS:  Life alert? No  Use of a cane, walker or w/c? No  Grab bars in the bathroom? No  Shower chair or bench in shower? No  Elevated toilet seat or a handicapped toilet? No   TIMED UP AND GO:  Was the test performed? No .  Length of time to ambulate 10 feet: 0 sec.   Gait steady and fast without use of assistive device  Cognitive Function: Normal cognitive status assessed by direct observation by this Nurse Health Advisor. No abnormalities found.   MMSE - Mini Mental State Exam 09/13/2017  Orientation to time 5  Orientation to Place 5  Registration 3  Attention/ Calculation 5  Recall 2  Language- name 2 objects 2  Language- repeat 1  Language- follow 3 step command 3  Language- read & follow direction 1  Write a sentence 1  Copy design 1  Total score 29        Immunizations Immunization History  Administered Date(s) Administered  . Fluad Quad(high Dose 65+) 08/19/2019, 08/11/2020  . Influenza Whole 10/31/2010, 09/14/2011, 10/16/2012  . Influenza, High Dose Seasonal PF 07/20/2013, 08/28/2017, 10/03/2018  . Influenza,inj,Quad PF,6+ Mos 01/12/2015, 11/02/2015  . Influenza-Unspecified 08/22/2016  . PFIZER(Purple Top)SARS-COV-2 Vaccination 01/03/2020, 01/27/2020, 08/28/2020  . Pneumococcal Conjugate-13 01/12/2015  . Pneumococcal Polysaccharide-23 12/14/2000, 01/19/2016  . Td 10/31/2010  . Tdap 08/11/2020  . Zoster 04/20/2016  . Zoster Recombinat (Shingrix) 08/21/2019, 10/28/2019    TDAP status: Up to date  Flu Vaccine status: Up to date  Pneumococcal vaccine status: Up to  date  Covid-19 vaccine status: Completed vaccines  Qualifies for Shingles Vaccine? Yes   Zostavax completed Yes   Shingrix Completed?: Yes  Screening Tests Health Maintenance  Topic Date Due  . COLONOSCOPY (Pts 45-32yr Insurance coverage will need to be confirmed)  07/13/2026  . TETANUS/TDAP  08/11/2030  . INFLUENZA VACCINE  Completed  . COVID-19 Vaccine  Completed  . Hepatitis C Screening  Completed  . PNA vac Low Risk Adult  Completed  . HPV VACCINES  Aged Out    Health Maintenance  There are no preventive care reminders to display for this patient.  Colorectal cancer screening: Type of screening: Colonoscopy. Completed 07/13/2016. Repeat every 10 years  Lung Cancer Screening: (Low Dose CT Chest recommended if Age 424-80years, 30 pack-year currently smoking OR have quit w/in 15years.) does not qualify.   Lung Cancer Screening Referral: no  Additional Screening:  Hepatitis C Screening: does qualify; Completed yes  Vision Screening: Recommended annual ophthalmology exams for early detection of glaucoma and other disorders of the eye. Is the patient up to date with their annual eye exam?  Yes  Who is the provider or what is the name of the office in which the patient attends annual eye exams? [facility]. If pt is not established with a provider, would they like to be referred to a provider to establish care? No .   Dental Screening: Recommended annual dental exams for  proper oral hygiene  Community Resource Referral / Chronic Care Management: CRR required this visit?  No   CCM required this visit?  No      Plan:     I have personally reviewed and noted the following in the patient's chart:   . Medical and social history . Use of alcohol, tobacco or illicit drugs  . Current medications and supplements . Functional ability and status . Nutritional status . Physical activity . Advanced directives . List of other physicians . Hospitalizations, surgeries,  and ER visits in previous 12 months . Vitals . Screenings to include cognitive, depression, and falls . Referrals and appointments  In addition, I have reviewed and discussed with patient certain preventive protocols, quality metrics, and best practice recommendations. A written personalized care plan for preventive services as well as general preventive health recommendations were provided to patient.     Sheral Flow, LPN   [id number]/54/[id number]   Nurse Notes:  Medications reviewed with patient; no opioid use noted.

## 2021-02-09 NOTE — Patient Instructions (Signed)
Richard Blake , Thank you for taking time to come for your Medicare Wellness Visit. I appreciate your ongoing commitment to your health goals. Please review the following plan we discussed and let me know if I can assist you in the future.   Screening recommendations/referrals: Colonoscopy: 07/13/2016; due every 10 years Recommended yearly ophthalmology/optometry visit for glaucoma screening and checkup Recommended yearly dental visit for hygiene and checkup  Vaccinations: Influenza vaccine: 08/11/2020 Pneumococcal vaccine: 01/12/2015, 01/19/2016 Tdap vaccine: 08/11/2020 Shingles vaccine: 08/21/2019, 10/18/2019   Covid-19: 01/03/2020, 01/27/2020, 08/28/2020  Advanced directives: Please bring a copy of your health care power of attorney and living will to the office at your convenience.  Conditions/risks identified: Yes; Reviewed health maintenance screenings with patient today and relevant education, vaccines, and/or referrals were provided. Please continue to do your personal lifestyle choices by: daily care of teeth and gums, regular physical activity (goal should be 5 days a week for 30 minutes), eat a healthy diet, avoid tobacco and drug use, limiting any alcohol intake, taking a low-dose aspirin (if not allergic or have been advised by your provider otherwise) and taking vitamins and minerals as recommended by your provider. Continue doing brain stimulating activities (puzzles, reading, adult coloring books, staying active) to keep memory sharp. Continue to eat heart healthy diet (full of fruits, vegetables, whole grains, lean protein, water--limit salt, fat, and sugar intake) and increase physical activity as tolerated.  Next appointment: Please schedule your next Medicare Wellness Visit with your Nurse Health Advisor in 1 year by calling [phone]. Preventive Care 35 Years and Older, Male Preventive care refers to lifestyle choices and visits with your health care provider that can promote health  and wellness. What does preventive care include?  A yearly physical exam. This is also called an annual well check.  Dental exams once or twice a year.  Routine eye exams. Ask your health care provider how often you should have your eyes checked.  Personal lifestyle choices, including:  Daily care of your teeth and gums.  Regular physical activity.  Eating a healthy diet.  Avoiding tobacco and drug use.  Limiting alcohol use.  Practicing safe sex.  Taking low doses of aspirin every day.  Taking vitamin and mineral supplements as recommended by your health care provider. What happens during an annual well check? The services and screenings done by your health care provider during your annual well check will depend on your age, overall health, lifestyle risk factors, and family history of disease. Counseling  Your health care provider may ask you questions about your:  Alcohol use.  Tobacco use.  Drug use.  Emotional well-being.  Home and relationship well-being.  Sexual activity.  Eating habits.  History of falls.  Memory and ability to understand (cognition).  Work and work Statistician. Screening  You may have the following tests or measurements:  Height, weight, and BMI.  Blood pressure.  Lipid and cholesterol levels. These may be checked every 5 years, or more frequently if you are over 15 years old.  Skin check.  Lung cancer screening. You may have this screening every year starting at age 36 if you have a 30-pack-year history of smoking and currently smoke or have quit within the past 15 years.  Fecal occult blood test (FOBT) of the stool. You may have this test every year starting at age 45.  Flexible sigmoidoscopy or colonoscopy. You may have a sigmoidoscopy every 5 years or a colonoscopy every 10 years starting at age 70.  Prostate  cancer screening. Recommendations will vary depending on your family history and other risks.  Hepatitis C  blood test.  Hepatitis B blood test.  Sexually transmitted disease (STD) testing.  Diabetes screening. This is done by checking your blood sugar (glucose) after you have not eaten for a while (fasting). You may have this done every 1-3 years.  Abdominal aortic aneurysm (AAA) screening. You may need this if you are a current or former smoker.  Osteoporosis. You may be screened starting at age 37 if you are at high risk. Talk with your health care provider about your test results, treatment options, and if necessary, the need for more tests. Vaccines  Your health care provider may recommend certain vaccines, such as:  Influenza vaccine. This is recommended every year.  Tetanus, diphtheria, and acellular pertussis (Tdap, Td) vaccine. You may need a Td booster every 10 years.  Zoster vaccine. You may need this after age 52.  Pneumococcal 13-valent conjugate (PCV13) vaccine. One dose is recommended after age 5.  Pneumococcal polysaccharide (PPSV23) vaccine. One dose is recommended after age 16. Talk to your health care provider about which screenings and vaccines you need and how often you need them. This information is not intended to replace advice given to you by your health care provider. Make sure you discuss any questions you have with your health care provider. Document Released: 11/26/2015 Document Revised: 07/19/2016 Document Reviewed: 08/31/2015 Elsevier Interactive Patient Education  2017 [place] Prevention in the Home Falls can cause injuries. They can happen to people of all ages. There are many things you can do to make your home safe and to help prevent falls. What can I do on the outside of my home?  Regularly fix the edges of walkways and driveways and fix any cracks.  Remove anything that might make you trip as you walk through a door, such as a raised step or threshold.  Trim any bushes or trees on the path to your home.  Use bright outdoor  lighting.  Clear any walking paths of anything that might make someone trip, such as rocks or tools.  Regularly check to see if handrails are loose or broken. Make sure that both sides of any steps have handrails.  Any raised decks and porches should have guardrails on the edges.  Have any leaves, snow, or ice cleared regularly.  Use sand or salt on walking paths during winter.  Clean up any spills in your garage right away. This includes oil or grease spills. What can I do in the bathroom?  Use night lights.  Install grab bars by the toilet and in the tub and shower. Do not use towel bars as grab bars.  Use non-skid mats or decals in the tub or shower.  If you need to sit down in the shower, use a plastic, non-slip stool.  Keep the floor dry. Clean up any water that spills on the floor as soon as it happens.  Remove soap buildup in the tub or shower regularly.  Attach bath mats securely with double-sided non-slip rug tape.  Do not have throw rugs and other things on the floor that can make you trip. What can I do in the bedroom?  Use night lights.  Make sure that you have a light by your bed that is easy to reach.  Do not use any sheets or blankets that are too big for your bed. They should not hang down onto the floor.  Have a  firm chair that has side arms. You can use this for support while you get dressed.  Do not have throw rugs and other things on the floor that can make you trip. What can I do in the kitchen?  Clean up any spills right away.  Avoid walking on wet floors.  Keep items that you use a lot in easy-to-reach places.  If you need to reach something above you, use a strong step stool that has a grab bar.  Keep electrical cords out of the way.  Do not use floor polish or wax that makes floors slippery. If you must use wax, use non-skid floor wax.  Do not have throw rugs and other things on the floor that can make you trip. What can I do with my  stairs?  Do not leave any items on the stairs.  Make sure that there are handrails on both sides of the stairs and use them. Fix handrails that are broken or loose. Make sure that handrails are as long as the stairways.  Check any carpeting to make sure that it is firmly attached to the stairs. Fix any carpet that is loose or worn.  Avoid having throw rugs at the top or bottom of the stairs. If you do have throw rugs, attach them to the floor with carpet tape.  Make sure that you have a light switch at the top of the stairs and the bottom of the stairs. If you do not have them, ask someone to add them for you. What else can I do to help prevent falls?  Wear shoes that:  Do not have high heels.  Have rubber bottoms.  Are comfortable and fit you well.  Are closed at the toe. Do not wear sandals.  If you use a stepladder:  Make sure that it is fully opened. Do not climb a closed stepladder.  Make sure that both sides of the stepladder are locked into place.  Ask someone to hold it for you, if possible.  Clearly mark and make sure that you can see:  Any grab bars or handrails.  First and last steps.  Where the edge of each step is.  Use tools that help you move around (mobility aids) if they are needed. These include:  Canes.  Walkers.  Scooters.  Crutches.  Turn on the lights when you go into a dark area. Replace any light bulbs as soon as they burn out.  Set up your furniture so you have a clear path. Avoid moving your furniture around.  If any of your floors are uneven, fix them.  If there are any pets around you, be aware of where they are.  Review your medicines with your doctor. Some medicines can make you feel dizzy. This can increase your chance of falling. Ask your doctor what other things that you can do to help prevent falls. This information is not intended to replace advice given to you by your health care provider. Make sure you discuss any  questions you have with your health care provider. Document Released: 08/26/2009 Document Revised: 04/06/2016 Document Reviewed: 12/04/2014 Elsevier Interactive Patient Education  2017 [employer].

## 2021-03-16 DIAGNOSIS — M5136 Other intervertebral disc degeneration, lumbar region: Secondary | ICD-10-CM | POA: Diagnosis not present

## 2021-03-16 DIAGNOSIS — M9904 Segmental and somatic dysfunction of sacral region: Secondary | ICD-10-CM | POA: Diagnosis not present

## 2021-03-16 DIAGNOSIS — M9903 Segmental and somatic dysfunction of lumbar region: Secondary | ICD-10-CM | POA: Diagnosis not present

## 2021-03-16 DIAGNOSIS — M9905 Segmental and somatic dysfunction of pelvic region: Secondary | ICD-10-CM | POA: Diagnosis not present

## 2021-04-21 ENCOUNTER — Other Ambulatory Visit: Payer: Self-pay | Admitting: Internal Medicine

## 2021-04-21 DIAGNOSIS — I10 Essential (primary) hypertension: Secondary | ICD-10-CM

## 2021-05-30 DIAGNOSIS — Z23 Encounter for immunization: Secondary | ICD-10-CM | POA: Diagnosis not present

## 2021-08-01 ENCOUNTER — Other Ambulatory Visit: Payer: Self-pay | Admitting: Internal Medicine

## 2021-08-01 DIAGNOSIS — I10 Essential (primary) hypertension: Secondary | ICD-10-CM

## 2021-09-26 ENCOUNTER — Ambulatory Visit (INDEPENDENT_AMBULATORY_CARE_PROVIDER_SITE_OTHER): Payer: Medicare Other | Admitting: Internal Medicine

## 2021-09-26 ENCOUNTER — Other Ambulatory Visit: Payer: Self-pay

## 2021-09-26 ENCOUNTER — Encounter: Payer: Self-pay | Admitting: Internal Medicine

## 2021-09-26 VITALS — BP 142/82 | HR 63 | Temp 98.2°F | Resp 16 | Ht 67.0 in | Wt 182.0 lb

## 2021-09-26 DIAGNOSIS — Z23 Encounter for immunization: Secondary | ICD-10-CM

## 2021-09-26 DIAGNOSIS — N138 Other obstructive and reflux uropathy: Secondary | ICD-10-CM

## 2021-09-26 DIAGNOSIS — G5602 Carpal tunnel syndrome, left upper limb: Secondary | ICD-10-CM

## 2021-09-26 DIAGNOSIS — E785 Hyperlipidemia, unspecified: Secondary | ICD-10-CM | POA: Diagnosis not present

## 2021-09-26 DIAGNOSIS — N183 Chronic kidney disease, stage 3 unspecified: Secondary | ICD-10-CM | POA: Diagnosis not present

## 2021-09-26 DIAGNOSIS — I10 Essential (primary) hypertension: Secondary | ICD-10-CM

## 2021-09-26 DIAGNOSIS — N401 Enlarged prostate with lower urinary tract symptoms: Secondary | ICD-10-CM | POA: Diagnosis not present

## 2021-09-26 DIAGNOSIS — N2889 Other specified disorders of kidney and ureter: Secondary | ICD-10-CM

## 2021-09-26 DIAGNOSIS — I4819 Other persistent atrial fibrillation: Secondary | ICD-10-CM | POA: Diagnosis not present

## 2021-09-26 LAB — LIPID PANEL
Cholesterol: 189 mg/dL (ref 0–200)
HDL: 57.2 mg/dL (ref >39.00)
LDL Cholesterol: 105 mg/dL — ABNORMAL HIGH (ref 0–99)
NonHDL: 131.72
Total CHOL/HDL Ratio: 3
Triglycerides: 133 mg/dL (ref 0.0–149.0)
VLDL: 26.6 mg/dL (ref 0.0–40.0)

## 2021-09-26 LAB — PSA: PSA: 4.04 ng/mL — ABNORMAL HIGH (ref 0.10–4.00)

## 2021-09-26 LAB — TSH: TSH: 0.98 u[IU]/mL (ref 0.35–5.50)

## 2021-09-26 NOTE — Patient Instructions (Signed)

## 2021-09-26 NOTE — Progress Notes (Signed)
Subjective:  Patient ID: Richard Blake, male    DOB: 1946/11/02  Age: 75 y.o. MRN: [id number]  CC: Hypertension  This visit occurred during the SARS-CoV-2 public health emergency.  Safety protocols were in place, including screening questions prior to the visit, additional usage of staff PPE, and extensive cleaning of exam room while observing appropriate contact time as indicated for disinfecting solutions.    HPI Richard Blake presents for f/up -  He has mild DOE but denies CP, palpitations, diaphoresis, edema, fatigue. + left wrist pain.  Outpatient Medications Prior to Visit  Medication Sig Dispense Refill   acetaminophen (TYLENOL) 500 MG tablet Take 500-1,000 mg by mouth every 6 (six) hours as needed (for pain from muscle spasms).     allopurinol (ZYLOPRIM) 100 MG tablet Take 2 tablets (200 mg total) by mouth daily. 180 tablet 0   Cholecalciferol 50 MCG (2000 UT) TABS Take 1 tablet (2,000 Units total) by mouth daily. 90 tablet 1   Colchicine (MITIGARE) 0.6 MG CAPS Take 1 tablet by mouth 2 (two) times daily. 60 capsule 5   Dietary Management Product (FOSTEUM PLUS) CAPS Take 1 capsule by mouth 2 (two) times daily. 60 capsule 11   ferrous sulfate 325 (65 FE) MG tablet Take 1 tablet (325 mg total) by mouth 3 (three) times daily with meals. 90 tablet 2   finasteride (PROSCAR) 5 MG tablet TAKE 1 TABLET(5 MG) BY MOUTH DAILY 90 tablet 1   losartan (COZAAR) 100 MG tablet TAKE 1 TABLET BY MOUTH EVERY DAY 90 tablet 2   metoprolol tartrate (LOPRESSOR) 25 MG tablet TAKE 1 TABLET(25 MG) BY MOUTH TWICE DAILY 180 tablet 1   pantoprazole (PROTONIX) 40 MG tablet TAKE 1 TABLET(40 MG) BY MOUTH DAILY 90 tablet 1   prednisoLONE acetate (PRED FORTE) 1 % ophthalmic suspension Instill 1 drop into affected eye four times a day  for 5 days, then stop.     Rivaroxaban (XARELTO) 15 MG TABS tablet Take 1 tablet (15 mg total) by mouth daily with supper. 90 tablet 1   rosuvastatin (CRESTOR) 10 MG tablet Take  1 tablet (10 mg total) by mouth daily. 90 tablet 1   SYSTANE ULTRA 0.4-0.3 % SOLN SMARTSIG:1 Drop(s) In Eye(s) As Needed     Tiotropium Bromide-Olodaterol (STIOLTO RESPIMAT) 2.5-2.5 MCG/ACT AERS Inhale 2 puffs into the lungs daily. 12 g 1   torsemide (DEMADEX) 20 MG tablet TAKE 1 TABLET(20 MG) BY MOUTH DAILY 90 tablet 0   vitamin B-12 (CYANOCOBALAMIN) 250 MCG tablet Take 250 mcg by mouth daily.     No facility-administered medications prior to visit.    ROS Review of Systems  Constitutional:  Negative for diaphoresis and fatigue.  HENT: Negative.    Eyes: Negative.   Respiratory:  Positive for shortness of breath. Negative for choking, chest tightness and wheezing.   Cardiovascular:  Negative for chest pain, palpitations and leg swelling.  Gastrointestinal:  Negative for abdominal pain, constipation, diarrhea, nausea and vomiting.  Endocrine: Negative.   Genitourinary: Negative.  Negative for difficulty urinating.  Musculoskeletal:  Positive for arthralgias. Negative for back pain and myalgias.  Skin: Negative.   Neurological:  Negative for dizziness, speech difficulty, weakness, light-headedness and numbness.  Hematological:  Negative for adenopathy. Does not bruise/bleed easily.  Psychiatric/Behavioral: Negative.     Objective:  BP (!) 142/82 (BP Location: Right Arm, Patient Position: Sitting, Cuff Size: Large)   Pulse 63   Temp 98.2 F (36.8 C) (Oral)   Resp 16  Ht 5\' 7"  (1.702 m)   Wt 182 lb (82.6 kg)   SpO2 96%   BMI 28.51 kg/m   BP Readings from Last 3 Encounters:  09/26/21 (!) 142/82  02/09/21 118/80  01/24/21 128/82    Wt Readings from Last 3 Encounters:  09/26/21 182 lb (82.6 kg)  02/09/21 183 lb (83 kg)  01/24/21 185 lb (83.9 kg)    Physical Exam Vitals reviewed.  HENT:     Nose: Nose normal.     Mouth/Throat:     Mouth: Mucous membranes are moist.  Eyes:     Conjunctiva/sclera: Conjunctivae normal.  Cardiovascular:     Rate and Rhythm: Normal  rate. Rhythm irregularly irregular.     Pulses: Normal pulses.     Heart sounds: No murmur heard. Pulmonary:     Effort: Pulmonary effort is normal.     Breath sounds: No stridor. No wheezing, rhonchi or rales.  Abdominal:     General: Abdomen is flat.     Palpations: There is no mass.     Tenderness: There is no abdominal tenderness. There is no guarding.     Hernia: No hernia is present.  Musculoskeletal:        General: Normal range of motion.     Cervical back: Neck supple.     Right lower leg: No edema.     Left lower leg: No edema.  Lymphadenopathy:     Cervical: No cervical adenopathy.  Skin:    General: Skin is warm and dry.  Neurological:     General: No focal deficit present.     Mental Status: He is alert.  Psychiatric:        Behavior: Behavior normal.    Lab Results  Component Value Date   WBC 3.9 (L) 01/24/2021   HGB 14.0 09/28/2021   HCT 44.5 01/24/2021   PLT 160.0 01/24/2021   GLUCOSE 89 01/24/2021   CHOL 189 09/26/2021   TRIG 133.0 09/26/2021   HDL 57.20 09/26/2021   LDLDIRECT 186.5 10/23/2012   LDLCALC 105 (H) 09/26/2021   ALT 15 04/22/2018   AST 15 04/22/2018   NA 139 09/28/2021   K 4.8 09/28/2021   CL 104 09/28/2021   CREATININE 1.5 (A) 09/28/2021   BUN 17 09/28/2021   CO2 24 (A) 09/28/2021   TSH 0.98 09/26/2021   PSA 4.04 (H) 09/26/2021   INR 1.1 12/14/2016    VAS [place] LOWER EXTREMITY VENOUS POST ABLATION  Result Date: 12/11/2018  Lower Venous Study Indications: Pain.  Risk Factors: Surgery Follow up post right great saphenous vein ablation 12-04-2018. Performing Technologist: Delorise Shiner RVT  Examination Guidelines: A complete evaluation includes B-mode imaging, spectral Doppler, color Doppler, and power Doppler as needed of all accessible portions of each vessel. Bilateral testing is considered an integral part of a complete examination. Limited examinations for reoccurring indications may be performed as noted.  Right Venous Findings:  +---+---------------+---------+-----------+--------------------+-------+    CompressibilityPhasicitySpontaneityProperties          Summary +---+---------------+---------+-----------+--------------------+-------+ CFVFull                    Yes                                    +---+---------------+---------+-----------+--------------------+-------+ SFJPartial                                                        +---+---------------+---------+-----------+--------------------+-------+  GSVNone                               spongy w/compression        +---+---------------+---------+-----------+--------------------+-------+ 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  Left Venous Findings: +---+---------------+---------+-----------+----------+-------+    CompressibilityPhasicitySpontaneityPropertiesSummary +---+---------------+---------+-----------+----------+-------+ CFVFull                    Yes                          +---+---------------+---------+-----------+----------+-------+    Summary: Right: No evidence of common femoral vein obstruction. Successful vein closure. Left: No evidence of common femoral vein obstruction.  *See table(s) above for measurements and observations. Electronically signed by Ruta Hinds MD on 12/11/2018 at 10:33:20 AM.    Final     Assessment & Plan:   Richard Blake was seen today for hypertension.  Diagnoses and all orders for this visit:  Essential hypertension- His BP is well controlled. -     TSH; Future -     TSH  BPH with obstruction/lower urinary tract symptoms- His PSA is not rising. -     PSA; Future -     PSA  CRI (chronic renal insufficiency), stage 3 (moderate)- Renal fxn is stable.  Flu vaccine need -     Flu Vaccine QUAD High Dose(Fluad)  Persistent atrial fibrillation- He has good rate control. Will continue the DOAC. -     TSH; Future -     TSH  Hyperlipidemia with target LDL  less than 130- LDL goal achieved. Doing well on the statin  -     Lipid panel; Future -     TSH; Future -     TSH -     Lipid panel  Carpal tunnel syndrome of left wrist -     Ambulatory referral to Orthopedic Surgery  I am having Richard Blake. Richard Blake maintain his vitamin B-12, acetaminophen, Fosteum Plus, Cholecalciferol, ferrous sulfate, Colchicine, prednisoLONE acetate, Systane Ultra, finasteride, rosuvastatin, losartan, metoprolol tartrate, pantoprazole, Rivaroxaban, allopurinol, Stiolto Respimat, and torsemide.  No orders of the defined types were placed in this encounter.    Follow-up: Return in about 6 months (around 03/26/2022).  Scarlette Calico, MD

## 2021-09-28 DIAGNOSIS — N183 Chronic kidney disease, stage 3 unspecified: Secondary | ICD-10-CM | POA: Diagnosis not present

## 2021-09-28 DIAGNOSIS — N189 Chronic kidney disease, unspecified: Secondary | ICD-10-CM | POA: Diagnosis not present

## 2021-09-28 DIAGNOSIS — M109 Gout, unspecified: Secondary | ICD-10-CM | POA: Diagnosis not present

## 2021-09-28 DIAGNOSIS — I129 Hypertensive chronic kidney disease with stage 1 through stage 4 chronic kidney disease, or unspecified chronic kidney disease: Secondary | ICD-10-CM | POA: Diagnosis not present

## 2021-09-28 DIAGNOSIS — D509 Iron deficiency anemia, unspecified: Secondary | ICD-10-CM | POA: Diagnosis not present

## 2021-09-28 DIAGNOSIS — I480 Paroxysmal atrial fibrillation: Secondary | ICD-10-CM | POA: Diagnosis not present

## 2021-09-28 LAB — IRON,TIBC AND FERRITIN PANEL
Ferritin: 25
Iron: 58
TIBC: 373

## 2021-09-28 LAB — BASIC METABOLIC PANEL
BUN: 17 (ref 4–21)
CO2: 24 — AB (ref 13–22)
Chloride: 104 (ref 99–108)
Creatinine: 1.5 — AB (ref 0.6–1.3)
Glucose: 96
Potassium: 4.8 (ref 3.4–5.3)
Sodium: 139 (ref 137–147)

## 2021-09-28 LAB — CBC AND DIFFERENTIAL: Hemoglobin: 14 (ref 13.5–17.5)

## 2021-09-28 LAB — COMPREHENSIVE METABOLIC PANEL: Calcium: 9.1 (ref 8.7–10.7)

## 2021-10-04 ENCOUNTER — Ambulatory Visit: Payer: Medicare Other | Admitting: Orthopedic Surgery

## 2021-10-11 ENCOUNTER — Encounter: Payer: Self-pay | Admitting: Orthopedic Surgery

## 2021-10-11 ENCOUNTER — Other Ambulatory Visit: Payer: Self-pay

## 2021-10-11 ENCOUNTER — Ambulatory Visit (INDEPENDENT_AMBULATORY_CARE_PROVIDER_SITE_OTHER): Payer: Medicare Other | Admitting: Orthopedic Surgery

## 2021-10-11 DIAGNOSIS — G5622 Lesion of ulnar nerve, left upper limb: Secondary | ICD-10-CM | POA: Diagnosis not present

## 2021-10-11 NOTE — Progress Notes (Signed)
Office Visit Note   Patient: Richard Blake           Date of Birth: 11/10/1946           MRN: [id number] Visit Date: 10/11/2021              Requested by: Janith Lima, MD [address] [place],  [place] [address] PCP: Janith Lima, MD   Assessment & Plan: Visit Diagnoses:  1. Ulnar neuropathy of left upper extremity     Plan: Discussed with patient that his exam findings are suggestive of a long standing ulnar neuropathy.  He thinks that all the symptoms began in the late 90s after a spinal cord biopsy for sarcoidosis.  He has significant atrophy of the first dorsal interosseous and intrinsics with clawing of the fourth and fifth digit.  His previous EMG from January was not very specific with detail.  Would like to repeat the EMG/nerve Conduction study here to see if it provides any more information.  We discussed the usual causes of ulnar neuropathy in the upper extremity including cervical spine disease, cubital tunnel syndrome, ulnar nerve compression at Guyon's canal.  I will see him back in the office after the EMG/nerve conduction study is completed.  Follow-Up Instructions: No follow-ups on file.   Orders:  No orders of the defined types were placed in this encounter.  No orders of the defined types were placed in this encounter.     Procedures: No procedures performed   Clinical Data: No additional findings.   Subjective: Chief Complaint  Patient presents with   Left Wrist - Pain   Left Hand - Pain, Numbness    Is a 75 year old right-hand-dominant male who presents with left hand weakness for years now.  He says he first noticed hand weakness over a year ago.  He has since been dropping things.  He has had numbness in all the fingers of his hand.  His numbness started back in the late 90s after a spinal biopsy for sarcoidosis.  He has noticed gradual atrophy of the muscles of his hand.  He had an EMG/nerve conduction study done in January which was  relatively nonspecific and noted ulnar neuropathy along its course in the upper extremity as well as mild carpal tunnel syndrome.  He also describes flexion deformity at the index finger PIP joint that started shortly after this hand weakness started.  He has not had any treatment to date.   Review of Systems   Objective: Vital Signs: There were no vitals taken for this visit.  Physical Exam Constitutional:      Appearance: Normal appearance.  Cardiovascular:     Rate and Rhythm: Normal rate.     Pulses: Normal pulses.  Pulmonary:     Effort: Pulmonary effort is normal.  Skin:    General: Skin is warm and dry.     Capillary Refill: Capillary refill takes less than 2 seconds.  Neurological:     Mental Status: He is alert.    Left Hand Exam   Other  Erythema: absent Sensation: decreased Pulse: present  Comments:  Significant atrophy of the FDI muscle and interossei.  No active finger abduction/adduction.  Clawing of the ring and small fingers but able to maintain PIP extension with correction of MP hyper-extension.  Negative Tinel sign at wrist or elbow.  Negative elbow flexion/compression test.  Negative CT compression or Phalen tests.      Specialty Comments:  No specialty  comments available.  Imaging: No results found.   PMFS History: Patient Active Problem List   Diagnosis Date Noted   Ulnar neuropathy of left upper extremity 10/11/2021   Carpal tunnel syndrome of left wrist 09/26/2021   Flu vaccine need 09/26/2021   Mucopurulent chronic bronchitis ([place]) 02/01/2021   Chronic gout due to renal impairment of multiple sites without tophus 01/24/2021   Atrophy of left hand muscles 08/11/2020   CRI (chronic renal insufficiency), stage 3 (moderate) 11/28/2018   AVM (arteriovenous malformation) of small bowel, acquired 07/10/2018   Varicose veins of both legs with edema 04/22/2018   Persistent atrial fibrillation 11/23/2016   Urolithiasis 01/20/2015   Vitamin D  deficiency 01/20/2015   Large hiatal hernia 01/14/2015   Hyperlipidemia with target LDL less than 130 01/22/2013   Osteoporosis 01/22/2013   BPH with obstruction/lower urinary tract symptoms 11/17/2010   Iron deficiency anemia 10/31/2010   Essential hypertension 10/31/2010   OSTEOARTHRITIS 10/31/2010   Sarcoidosis 09/12/2007   INTERSTITIAL LUNG DISEASE 09/12/2007   Past Medical History:  Diagnosis Date   A-fib ([place]) 11/2016   Tx with po meds at this time   Anemia    Microcytic with Fe Sat 4% 10/31/10   Atypical chest pain    Carpal tunnel syndrome, left    Cataract    removed bilaterally   Chronic rhinitis    Cough    Diverticulosis    Dyspnea    " WITH MY AFIB "   GERD (gastroesophageal reflux disease)    Hiatal hernia    History of recent blood transfusion 06/05/2018   HLD (hyperlipidemia)    HTN (hypertension)    Interstitial lung disease ([place])    Kidney stones    Osteoarthritis    left hand   Osteoporosis 2014   Sarcoidosis 8/99   Transbronchial biopsy, taking daily prednisone since  November 08 on and off since 1999 w/cough every time he relapses off it for more than several weeks   Undescended right testicle    "shrivled" in childhood from 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    Family History  Problem Relation Age of Onset   Asthma Mother    Diabetes Mother    Arthritis Paternal Grandmother    Heart disease Paternal Grandmother    Hypertension Father        Fathers family most   Prostate cancer Father    Kidney disease Father    Colon polyps Sister    Multiple myeloma Sister    Heart disease Paternal Uncle    Kidney disease Sister    Sarcoidosis Neg Hx    Other Neg Hx        osteoporosis   Colon cancer Neg Hx    Esophageal cancer Neg Hx    Rectal cancer Neg Hx    Stomach cancer Neg Hx     Past Surgical History:  Procedure Laterality Date   CARDIOVERSION N/A 12/21/2016   Procedure: CARDIOVERSION;  Surgeon: Evans Lance, MD;  Location: [facility];  Service:  Cardiovascular;  Laterality: N/A;   CARDIOVERSION N/A 01/31/2017   Procedure: CARDIOVERSION;  Surgeon: Pixie Casino, MD;  Location: [facility];  Service: Cardiovascular;  Laterality: N/A;   CATARACT EXTRACTION Left 10/14/2013   CATARACT EXTRACTION Right 11/24/2013   CERVICAL FUSION     pt states he did not have a cervical fusion, just a biopsy    COLONOSCOPY  01/12/2006   tics only    ENDOVENOUS ABLATION SAPHENOUS VEIN W/ LASER Left 10/23/2018  endovenous laser ablation left greater saphenous vein by Ruta Hinds MD    ENDOVENOUS ABLATION SAPHENOUS VEIN W/ LASER Right 12/04/2018   endovenous laser ablation right greater saphenous vein by Ruta Hinds MD    INGUINAL HERNIA REPAIR     right   LIPOMA EXCISION Left 09/16/2004   forearm   spinal cord biopsyOther]  1996   sarcoid   Social History   Occupational History   Occupation: Retired    Fish farm manager: [employer]  Tobacco Use   Smoking status: Never   Smokeless tobacco: Never  Vaping Use   Vaping Use: Never used  Substance and Sexual Activity   Alcohol use: No    Alcohol/week: 0.0 standard drinks   Drug use: No   Sexual activity: Not Currently

## 2021-10-18 ENCOUNTER — Other Ambulatory Visit: Payer: Self-pay

## 2021-10-18 ENCOUNTER — Ambulatory Visit (INDEPENDENT_AMBULATORY_CARE_PROVIDER_SITE_OTHER): Payer: Medicare Other | Admitting: Physical Medicine and Rehabilitation

## 2021-10-18 DIAGNOSIS — R202 Paresthesia of skin: Secondary | ICD-10-CM | POA: Diagnosis not present

## 2021-10-18 DIAGNOSIS — R29898 Other symptoms and signs involving the musculoskeletal system: Secondary | ICD-10-CM

## 2021-10-18 DIAGNOSIS — R531 Weakness: Secondary | ICD-10-CM

## 2021-10-18 NOTE — Progress Notes (Signed)
KASHAWN Blake - 75 y.o. male MRN [id number]  Date of birth: 01-25-1946  Office Visit Note: Visit Date: 10/18/2021 PCP: Janith Lima, MD Referred by: Janith Lima, MD  Subjective: Chief Complaint  Patient presents with   Other    LUE EMG?NCV   HPI:  Richard Blake is a 75 y.o. male who comes in today at the request of Dr. Audria Nine for electrodiagnostic study of the Left upper extremities.  Patient is Right hand dominant.  He is complaining of chronic several year history of left hand weakness with numbness and pain.  He feels like the hand weakness really began a year or so ago.  He is starting to drop objects and having difficulty using the hand.  He also reports a lot of stiffness and pain in the left hand.  He reports a nondermatomal numbness in the whole hand.  He has a history of some type of spinal biopsy for sarcoidosis and he also thinks that a lot of the symptoms came from using the self-propelled mower where he has to hold the handle while he mows.  He also talks about having his hand and in particular his index finger flexed.  He has had prior electrodiagnostic study by Dr. Leta Baptist and as reviewed below. ROS Otherwise per HPI.  Assessment & Plan: Visit Diagnoses:    ICD-10-CM   1. Paresthesia of skin  R20.2 NCV with EMG (electromyography)    2. Weakness  R53.1 NCV with EMG (electromyography)    3. Left hand weakness  R29.898 NCV with EMG (electromyography)      Plan: Impression: The above electrodiagnostic study is ABNORMAL and reveals evidence of:   A  severe left ulnar nerve neuropathy proximal to flexor digitorum profundus affecting sensory and motor components. Likely location is at the elbow but do to muscle atrophy the nerve conductions are not able to localize the lesion. The lesion is characterized by sensory and motor demyelination with evidence of significant axonal injury. *This is consistent with prior electrodiagnostic study.   A moderate  left median nerve entrapment at the wrist affecting sensory and motor components.   There is no significant electrodiagnostic evidence of any other focal nerve entrapment, brachial plexopathy or cervical radiculopathy. *Prior electrodiagnostic study showed denervation in the tricep muscle.  Other muscle groups in the arms at least on the study were fairly normal and I do not see any real evidence of radiculopathy.  Recommendations: 1.  Follow-up with referring physician. 2.  Continue current management of symptoms. 3.  Suggest surgical evaluation.  Meds & Orders: No orders of the defined types were placed in this encounter.   Orders Placed This Encounter  Procedures   NCV with EMG (electromyography)    Follow-up: Return in about 2 weeks (around 11/01/2021) for Audria Nine, MD.   Procedures: No procedures performed  EMG & NCV Findings: Evaluation of the left median motor nerve showed prolonged distal onset latency (5.8 ms) and decreased conduction velocity (Elbow-Wrist, 48 m/s).  The left ulnar motor nerve showed no response (B Elbow), no response (A Elbow), prolonged distal onset latency (14.4 ms), and reduced amplitude (0.1 mV).  The left median (across palm) sensory nerve showed prolonged distal peak latency (Wrist, 5.3 ms), reduced amplitude (9.5 V), and prolonged distal peak latency (Palm, 4.1 ms).  The left ulnar sensory nerve showed no response (B Elbow), no response (A Elbow), prolonged distal peak latency (5.4 ms), reduced amplitude (3.8 V), and decreased conduction velocity (  Wrist-5th Digit, 26 m/s).  All remaining nerves (as indicated in the following tables) were within normal limits.    Needle evaluation of the left first dorsal interosseous, the left flexor digitorum profundus, and the left abductor digiti minimi muscles showed increased insertional activity, widespread spontaneous activity, and diminished recruitment.  All remaining muscles (as indicated in the following  table) showed no evidence of electrical instability.    Impression: The above electrodiagnostic study is ABNORMAL and reveals evidence of:   A  severe left ulnar nerve neuropathy proximal to flexor digitorum profundus affecting sensory and motor components. Likely location is at the elbow but do to muscle atrophy the nerve conductions are not able to localize the lesion. The lesion is characterized by sensory and motor demyelination with evidence of significant axonal injury. *This is consistent with prior electrodiagnostic study.   A moderate left median nerve entrapment at the wrist affecting sensory and motor components.   There is no significant electrodiagnostic evidence of any other focal nerve entrapment, brachial plexopathy or cervical radiculopathy. *Prior electrodiagnostic study showed denervation in the tricep muscle.  Other muscle groups in the arms at least on the study were fairly normal and I do not see any real evidence of radiculopathy.  Recommendations: 1.  Follow-up with referring physician. 2.  Continue current management of symptoms. 3.  Suggest surgical evaluation.  ___________________________ Laurence Spates FAAPMR Board Certified, American Board of Physical Medicine and Rehabilitation    Nerve Conduction Studies Anti Sensory Summary Table   Stim Site NR Peak (ms) Norm Peak (ms) P-T Amp (V) Norm P-T Amp Site1 Site2 Delta-P (ms) Dist (cm) Vel (m/s) Norm Vel (m/s)  Left Median Acr Palm Anti Sensory (2nd Digit)  32.9C  Wrist    *5.3 <3.6 *9.5 >10 Wrist Palm 1.2 0.0    Palm    *4.1 <2.0 0.2         Left Radial Anti Sensory (Base 1st Digit)  32.1C  Wrist    1.9 <3.1 24.3  Wrist Base 1st Digit 1.9 0.0    Left Ulnar Anti Sensory (5th Digit)  32.9C  Wrist    *5.4 <3.7 *3.8 >15.0 Wrist 5th Digit 5.4 14.0 *26 >38  B Elbow *NR     B Elbow Wrist  24.0  >47  A Elbow *NR     A Elbow B Elbow  10.0    Axilla    5.8  14.6  Axilla A Elbow  0.0     Motor Summary Table    Stim Site NR Onset (ms) Norm Onset (ms) O-P Amp (mV) Norm O-P Amp Site1 Site2 Delta-0 (ms) Dist (cm) Vel (m/s) Norm Vel (m/s)  Left Median Motor (Abd Poll Brev)  32.4C  Wrist    *5.8 <4.2 6.2 >5 Elbow Wrist 5.1 24.5 *48 >50  Elbow    10.9  5.5         Left Ulnar Motor (Abd Dig Min)  32.3C  Wrist    *14.4 <4.2 *0.1 >3 B Elbow Wrist  24.0  >53  B Elbow *NR     A Elbow B Elbow  10.0  >53  A Elbow *NR             EMG   Side Muscle Nerve Root Ins Act Fibs Psw Amp Dur Poly Recrt Int Fraser Din Comment  Left Abd Poll Brev Median C8-T1 Nml Nml Nml Nml Nml 0 Nml Nml   Left 1stDorInt Ulnar C8-T1 *Incr *4+ *4+ Nml Nml 0 *Reduced Nml  rare MUAP  Left PronatorTeres Median C6-7 Nml Nml Nml Nml Nml 0 Nml Nml   Left Biceps Musculocut C5-6 Nml Nml Nml Nml Nml 0 Nml Nml   Left Deltoid Axillary C5-6 Nml Nml Nml Nml Nml 0 Nml Nml   Left FlexDigProf Ulnar C8,T1 *Incr *4+ *4+ Nml Nml 0 *Reduced Nml rare MUAP  Left ABD Dig Min Ulnar C8-T1 *Incr *4+ *4+ Nml Nml 0 *Reduced Nml rare MUAP    Nerve Conduction Studies Anti Sensory Left/Right Comparison   Stim Site L Lat (ms) R Lat (ms) L-R Lat (ms) L Amp (V) R Amp (V) L-R Amp (%) Site1 Site2 L Vel (m/s) R Vel (m/s) L-R Vel (m/s)  Median Acr Palm Anti Sensory (2nd Digit)  32.9C  Wrist *5.3   *9.5   Wrist Palm     Palm *4.1   0.2         Radial Anti Sensory (Base 1st Digit)  32.1C  Wrist 1.9   24.3   Wrist Base 1st Digit     Ulnar Anti Sensory (5th Digit)  32.9C  Wrist *5.4   *3.8   Wrist 5th Digit *26    B Elbow       B Elbow Wrist     A Elbow       A Elbow B Elbow     Axilla 5.8   14.6   Axilla A Elbow      Motor Left/Right Comparison   Stim Site L Lat (ms) R Lat (ms) L-R Lat (ms) L Amp (mV) R Amp (mV) L-R Amp (%) Site1 Site2 L Vel (m/s) R Vel (m/s) L-R Vel (m/s)  Median Motor (Abd Poll Brev)  32.4C  Wrist *5.8   6.2   Elbow Wrist *48    Elbow 10.9   5.5         Ulnar Motor (Abd Dig Min)  32.3C  Wrist *14.4   *0.1   B Elbow Wrist     B Elbow       A  Elbow B Elbow     A Elbow                Waveforms:            Clinical History: 11/18/20 EMG/NCS  IMPRESSION:    Abnormal study demonstrating: -Severe left ulnar neuropathy throughout its course. -Moderate bilateral median neuropathies at the wrists consistent with moderate bilateral carpal tunnel syndrome. -Chronic denervation of left triceps brachii, may be related to C7 radiculopathy.       INTERPRETING PHYSICIAN:  Penni Bombard, MD Certified in Neurology, Neurophysiology and Neuroimaging     Objective:  VS:  HT:    WT:   BMI:     BP:   HR: bpm  TEMP: ( )  RESP:  Physical Exam Musculoskeletal:        General: No tenderness.     Comments: Inspection reveals significant atrophy of the left FDI and hand intrinsics with clawing of the hand and positive Benedictine sign.  Right hand does not have any atrophy. There is no swelling, color changes, allodynia or dystrophic changes.  There is weakness with finger abduction on the left which is essentially 3 out of 5. There is increased sensation to light touch in the left ulnar nerve distribution.. There is a positive Froment's test on the left. There is a negative Hoffmann's test bilaterally.  Skin:    General: Skin is warm and dry.  Findings: No erythema or rash.  Neurological:     General: No focal deficit present.     Mental Status: He is alert and oriented to person, place, and time.     Sensory: No sensory deficit.     Motor: No weakness or abnormal muscle tone.     Coordination: Coordination normal.     Gait: Gait normal.  Psychiatric:        Mood and Affect: Mood normal.        Behavior: Behavior normal.        Thought Content: Thought content normal.     Imaging: No results found.

## 2021-10-24 NOTE — Procedures (Signed)
EMG & NCV Findings: Evaluation of the left median motor nerve showed prolonged distal onset latency (5.8 ms) and decreased conduction velocity (Elbow-Wrist, 48 m/s).  The left ulnar motor nerve showed no response (B Elbow), no response (A Elbow), prolonged distal onset latency (14.4 ms), and reduced amplitude (0.1 mV).  The left median (across palm) sensory nerve showed prolonged distal peak latency (Wrist, 5.3 ms), reduced amplitude (9.5 V), and prolonged distal peak latency (Palm, 4.1 ms).  The left ulnar sensory nerve showed no response (B Elbow), no response (A Elbow), prolonged distal peak latency (5.4 ms), reduced amplitude (3.8 V), and decreased conduction velocity (Wrist-5th Digit, 26 m/s).  All remaining nerves (as indicated in the following tables) were within normal limits.    Needle evaluation of the left first dorsal interosseous, the left flexor digitorum profundus, and the left abductor digiti minimi muscles showed increased insertional activity, widespread spontaneous activity, and diminished recruitment.  All remaining muscles (as indicated in the following table) showed no evidence of electrical instability.    Impression: The above electrodiagnostic study is ABNORMAL and reveals evidence of:   A  severe left ulnar nerve neuropathy proximal to flexor digitorum profundus affecting sensory and motor components. Likely location is at the elbow but do to muscle atrophy the nerve conductions are not able to localize the lesion. The lesion is characterized by sensory and motor demyelination with evidence of significant axonal injury. *This is consistent with prior electrodiagnostic study.   A moderate left median nerve entrapment at the wrist affecting sensory and motor components.   There is no significant electrodiagnostic evidence of any other focal nerve entrapment, brachial plexopathy or cervical radiculopathy. *Prior electrodiagnostic study showed denervation in the tricep muscle.   Other muscle groups in the arms at least on the study were fairly normal and I do not see any real evidence of radiculopathy.  Recommendations: 1.  Follow-up with referring physician. 2.  Continue current management of symptoms. 3.  Suggest surgical evaluation.  ___________________________ Laurence Spates FAAPMR Board Certified, American Board of Physical Medicine and Rehabilitation    Nerve Conduction Studies Anti Sensory Summary Table   Stim Site NR Peak (ms) Norm Peak (ms) P-T Amp (V) Norm P-T Amp Site1 Site2 Delta-P (ms) Dist (cm) Vel (m/s) Norm Vel (m/s)  Left Median Acr Palm Anti Sensory (2nd Digit)  32.9C  Wrist    *5.3 <3.6 *9.5 >10 Wrist Palm 1.2 0.0    Palm    *4.1 <2.0 0.2         Left Radial Anti Sensory (Base 1st Digit)  32.1C  Wrist    1.9 <3.1 24.3  Wrist Base 1st Digit 1.9 0.0    Left Ulnar Anti Sensory (5th Digit)  32.9C  Wrist    *5.4 <3.7 *3.8 >15.0 Wrist 5th Digit 5.4 14.0 *26 >38  B Elbow *NR     B Elbow Wrist  24.0  >47  A Elbow *NR     A Elbow B Elbow  10.0    Axilla    5.8  14.6  Axilla A Elbow  0.0     Motor Summary Table   Stim Site NR Onset (ms) Norm Onset (ms) O-P Amp (mV) Norm O-P Amp Site1 Site2 Delta-0 (ms) Dist (cm) Vel (m/s) Norm Vel (m/s)  Left Median Motor (Abd Poll Brev)  32.4C  Wrist    *5.8 <4.2 6.2 >5 Elbow Wrist 5.1 24.5 *48 >50  Elbow    10.9  5.5  Left Ulnar Motor (Abd Dig Min)  32.3C  Wrist    *14.4 <4.2 *0.1 >3 B Elbow Wrist  24.0  >53  B Elbow *NR     A Elbow B Elbow  10.0  >53  A Elbow *NR             EMG   Side Muscle Nerve Root Ins Act Fibs Psw Amp Dur Poly Recrt Int Fraser Din Comment  Left Abd Poll Brev Median C8-T1 Nml Nml Nml Nml Nml 0 Nml Nml   Left 1stDorInt Ulnar C8-T1 *Incr *4+ *4+ Nml Nml 0 *Reduced Nml rare MUAP  Left PronatorTeres Median C6-7 Nml Nml Nml Nml Nml 0 Nml Nml   Left Biceps Musculocut C5-6 Nml Nml Nml Nml Nml 0 Nml Nml   Left Deltoid Axillary C5-6 Nml Nml Nml Nml Nml 0 Nml Nml   Left FlexDigProf  Ulnar C8,T1 *Incr *4+ *4+ Nml Nml 0 *Reduced Nml rare MUAP  Left ABD Dig Min Ulnar C8-T1 *Incr *4+ *4+ Nml Nml 0 *Reduced Nml rare MUAP    Nerve Conduction Studies Anti Sensory Left/Right Comparison   Stim Site L Lat (ms) R Lat (ms) L-R Lat (ms) L Amp (V) R Amp (V) L-R Amp (%) Site1 Site2 L Vel (m/s) R Vel (m/s) L-R Vel (m/s)  Median Acr Palm Anti Sensory (2nd Digit)  32.9C  Wrist *5.3   *9.5   Wrist Palm     Palm *4.1   0.2         Radial Anti Sensory (Base 1st Digit)  32.1C  Wrist 1.9   24.3   Wrist Base 1st Digit     Ulnar Anti Sensory (5th Digit)  32.9C  Wrist *5.4   *3.8   Wrist 5th Digit *26    B Elbow       B Elbow Wrist     A Elbow       A Elbow B Elbow     Axilla 5.8   14.6   Axilla A Elbow      Motor Left/Right Comparison   Stim Site L Lat (ms) R Lat (ms) L-R Lat (ms) L Amp (mV) R Amp (mV) L-R Amp (%) Site1 Site2 L Vel (m/s) R Vel (m/s) L-R Vel (m/s)  Median Motor (Abd Poll Brev)  32.4C  Wrist *5.8   6.2   Elbow Wrist *48    Elbow 10.9   5.5         Ulnar Motor (Abd Dig Min)  32.3C  Wrist *14.4   *0.1   B Elbow Wrist     B Elbow       A Elbow B Elbow     A Elbow                Waveforms:

## 2021-11-03 ENCOUNTER — Encounter: Payer: Self-pay | Admitting: Orthopedic Surgery

## 2021-11-03 ENCOUNTER — Ambulatory Visit (INDEPENDENT_AMBULATORY_CARE_PROVIDER_SITE_OTHER): Payer: Medicare Other | Admitting: Orthopedic Surgery

## 2021-11-03 VITALS — BP 138/80 | HR 57

## 2021-11-03 DIAGNOSIS — G5622 Lesion of ulnar nerve, left upper limb: Secondary | ICD-10-CM | POA: Diagnosis not present

## 2021-11-03 DIAGNOSIS — G5602 Carpal tunnel syndrome, left upper limb: Secondary | ICD-10-CM | POA: Diagnosis not present

## 2021-11-03 NOTE — Progress Notes (Signed)
Office Visit Note   Patient: Richard Blake           Date of Birth: 12/23/1945           MRN: [id number] Visit Date: 11/03/2021              Requested by: Janith Lima, MD [address] [place],  [place] [address] PCP: Janith Lima, MD   Assessment & Plan: Visit Diagnoses:  1. Ulnar neuropathy of left upper extremity   2. Carpal tunnel syndrome of left wrist     Plan: Reviewed the EMG/NCS results with patient which showed severe left ulnar neuropathy, likely above the elbow, and a moderate carpal tunnel syndrome.  Discussed with patient that treatment would involve a cubital tunnel release though it may not improve his significant intrinsic atrophy given the chronicity of his nerve injury.  His symptoms started in the 1990s after a spinal cord biopsy.  He wants to think about it.  He is able to function well with his hand with minimal affect on his daily life.  He will call the office if he decides to proceed with surgery.   Follow-Up Instructions: No follow-ups on file.   Orders:  No orders of the defined types were placed in this encounter.  No orders of the defined types were placed in this encounter.     Procedures: No procedures performed   Clinical Data: No additional findings.   Subjective: Chief Complaint  Patient presents with   Left Hand - Follow-up    This is a 75 year old right-hand-dominant male who presents for follow-up of left hand weakness that is chronic in nature.  His symptoms for started with numbness in his left hand and bilateral lower extremities 1990s after a spinal biopsy for sarcoidosis.  He noticed hand weakness in his hand a year or 2 ago.  He notes that he occasionally drops things with his hand.  Recent EMG/nerve conduction study demonstrated severe ulnar neuropathy likely at the elbow with a moderate carpal tunnel syndrome.  She is able to do all things he wants to do his daily life without difficulty.  He has not had any  treatment so far.   Review of Systems   Objective: Vital Signs: BP 138/80 (BP Location: Right Arm, Patient Position: Sitting, Cuff Size: Normal)    Pulse (!) 57    SpO2 98%   Physical Exam  Left Hand Exam   Tenderness  The patient is experiencing no tenderness.   Other  Erythema: absent Sensation: normal Pulse: present  Comments:  Negative Tinel at elbow or wrist.  Significant intrinsic atrophy with no active abduction/adduction of fingers.  Flexible clawing of the ring and small finger with active extension possible with MP hyper-extension corrected.  Able to make strong and complete fist.      Specialty Comments:  No specialty comments available.  Imaging: No results found.   PMFS History: Patient Active Problem List   Diagnosis Date Noted   Ulnar neuropathy of left upper extremity 10/11/2021   Carpal tunnel syndrome of left wrist 09/26/2021   Flu vaccine need 09/26/2021   Mucopurulent chronic bronchitis ([place]) 02/01/2021   Chronic gout due to renal impairment of multiple sites without tophus 01/24/2021   Atrophy of left hand muscles 08/11/2020   CRI (chronic renal insufficiency), stage 3 (moderate) 11/28/2018   AVM (arteriovenous malformation) of small bowel, acquired 07/10/2018   Varicose veins of both legs with edema 04/22/2018   Persistent  atrial fibrillation 11/23/2016   Urolithiasis 01/20/2015   Vitamin D deficiency 01/20/2015   Large hiatal hernia 01/14/2015   Hyperlipidemia with target LDL less than 130 01/22/2013   Osteoporosis 01/22/2013   BPH with obstruction/lower urinary tract symptoms 11/17/2010   Iron deficiency anemia 10/31/2010   Essential hypertension 10/31/2010   OSTEOARTHRITIS 10/31/2010   Sarcoidosis 09/12/2007   INTERSTITIAL LUNG DISEASE 09/12/2007   Past Medical History:  Diagnosis Date   A-fib ([place]) 11/2016   Tx with po meds at this time   Anemia    Microcytic with Fe Sat 4% 10/31/10   Atypical chest pain    Carpal tunnel  syndrome, left    Cataract    removed bilaterally   Chronic rhinitis    Cough    Diverticulosis    Dyspnea    " WITH MY AFIB "   GERD (gastroesophageal reflux disease)    Hiatal hernia    History of recent blood transfusion 06/05/2018   HLD (hyperlipidemia)    HTN (hypertension)    Interstitial lung disease ([place])    Kidney stones    Osteoarthritis    left hand   Osteoporosis 2014   Sarcoidosis 8/99   Transbronchial biopsy, taking daily prednisone since  November 08 on and off since 1999 w/cough every time he relapses off it for more than several weeks   Undescended right testicle    "shrivled" in childhood from 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    Family History  Problem Relation Age of Onset   Asthma Mother    Diabetes Mother    Arthritis Paternal Grandmother    Heart disease Paternal Grandmother    Hypertension Father        Fathers family most   Prostate cancer Father    Kidney disease Father    Colon polyps Sister    Multiple myeloma Sister    Heart disease Paternal Uncle    Kidney disease Sister    Sarcoidosis Neg Hx    Other Neg Hx        osteoporosis   Colon cancer Neg Hx    Esophageal cancer Neg Hx    Rectal cancer Neg Hx    Stomach cancer Neg Hx     Past Surgical History:  Procedure Laterality Date   CARDIOVERSION N/A 12/21/2016   Procedure: CARDIOVERSION;  Surgeon: Evans Lance, MD;  Location: [facility];  Service: Cardiovascular;  Laterality: N/A;   CARDIOVERSION N/A 01/31/2017   Procedure: CARDIOVERSION;  Surgeon: Pixie Casino, MD;  Location: [facility];  Service: Cardiovascular;  Laterality: N/A;   CATARACT EXTRACTION Left 10/14/2013   CATARACT EXTRACTION Right 11/24/2013   CERVICAL FUSION     pt states he did not have a cervical fusion, just a biopsy    COLONOSCOPY  01/12/2006   tics only    ENDOVENOUS ABLATION SAPHENOUS VEIN W/ LASER Left 10/23/2018   endovenous laser ablation left greater saphenous vein by Ruta Hinds MD    ENDOVENOUS ABLATION  SAPHENOUS VEIN W/ LASER Right 12/04/2018   endovenous laser ablation right greater saphenous vein by Ruta Hinds MD    [place]     right   LIPOMA EXCISION Left 09/16/2004   forearm   spinal cord biopsyOther]  1996   sarcoid   Social History   Occupational History   Occupation: Retired    Fish farm manager: [employer]  Tobacco Use   Smoking status: Never   Smokeless tobacco: Never  Vaping Use   Vaping Use: Never used  Substance and Sexual Activity   Alcohol use: No    Alcohol/week: 0.0 standard drinks   Drug use: No   Sexual activity: Not Currently

## 2021-11-07 ENCOUNTER — Other Ambulatory Visit: Payer: Self-pay | Admitting: Internal Medicine

## 2021-11-07 DIAGNOSIS — I10 Essential (primary) hypertension: Secondary | ICD-10-CM

## 2021-11-10 DIAGNOSIS — Z23 Encounter for immunization: Secondary | ICD-10-CM | POA: Diagnosis not present

## 2022-01-03 DIAGNOSIS — M9904 Segmental and somatic dysfunction of sacral region: Secondary | ICD-10-CM | POA: Diagnosis not present

## 2022-01-03 DIAGNOSIS — M5136 Other intervertebral disc degeneration, lumbar region: Secondary | ICD-10-CM | POA: Diagnosis not present

## 2022-01-03 DIAGNOSIS — M9903 Segmental and somatic dysfunction of lumbar region: Secondary | ICD-10-CM | POA: Diagnosis not present

## 2022-01-03 DIAGNOSIS — M9905 Segmental and somatic dysfunction of pelvic region: Secondary | ICD-10-CM | POA: Diagnosis not present

## 2022-01-19 NOTE — Progress Notes (Signed)
PCP:  [name], MD Primary Cardiologist: Lewayne Bunting, MD Electrophysiologist: Lewayne Bunting, MD   Richard Blake is a 76 y.o. male seen today for Lewayne Bunting, MD for routine electrophysiology followup.  Since last being seen in our clinic the patient reports doing well from a cardiac perspective.   He does have intermittent episodes of gout, that he attributes to his torsemide. To combat this, he drinks at least a 16 oz Gatorade and a 16 oz water bottle daily. If he misses torsemide, he notices that he gains about 5 lbs. he denies chest pain, palpitations, dyspnea, PND, orthopnea, nausea, vomiting, dizziness, syncope, edema, weight gain, or early satiety.  Past Medical History:  Diagnosis Date   A-fib ([facility]) 11/2016   Tx with po meds at this time   Anemia    Microcytic with Fe Sat 4% 10/31/10   Atypical chest pain    Carpal tunnel syndrome, left    Cataract    removed bilaterally   Chronic rhinitis    Cough    Diverticulosis    Dyspnea    " WITH MY AFIB "   GERD (gastroesophageal reflux disease)    Hiatal hernia    History of recent blood transfusion 06/05/2018   HLD (hyperlipidemia)    HTN (hypertension)    Interstitial lung disease ([facility])    Kidney stones    Osteoarthritis    left hand   Osteoporosis 2014   Sarcoidosis 8/99   Transbronchial biopsy, taking daily prednisone since  November 08 on and off since 1999 w/cough every time he relapses off it for more than several weeks   Undescended right testicle    "shrivled" in childhood from 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   Past Surgical History:  Procedure Laterality Date   CARDIOVERSION N/A 12/21/2016   Procedure: CARDIOVERSION;  Surgeon: Marinus Maw, MD;  Location: [facility]COPY;  Service: Cardiovascular;  Laterality: N/A;   CARDIOVERSION N/A 01/31/2017   Procedure: CARDIOVERSION;  Surgeon: Chrystie Nose, MD;  Location: [facility];  Service: Cardiovascular;  Laterality: N/A;   CATARACT EXTRACTION Left 10/14/2013    CATARACT EXTRACTION Right 11/24/2013   CERVICAL FUSION     pt states he did not have a cervical fusion, just a biopsy    COLONOSCOPY  01/12/2006   tics only    ENDOVENOUS ABLATION SAPHENOUS VEIN W/ LASER Left 10/23/2018   endovenous laser ablation left greater saphenous vein by Fabienne Bruns MD    ENDOVENOUS ABLATION SAPHENOUS VEIN W/ LASER Right 12/04/2018   endovenous laser ablation right greater saphenous vein by Fabienne Bruns MD    INGUINAL HERNIA REPAIR     right   LIPOMA EXCISION Left 09/16/2004   forearm   spinal cord biopsyOther]  1996   sarcoid    Current Outpatient Medications  Medication Sig Dispense Refill   acetaminophen (TYLENOL) 500 MG tablet Take 500-1,000 mg by mouth every 6 (six) hours as needed (for pain from muscle spasms).     allopurinol (ZYLOPRIM) 100 MG tablet Take 2 tablets (200 mg total) by mouth daily. 180 tablet 0   Cholecalciferol 50 MCG (2000 UT) TABS Take 1 tablet (2,000 Units total) by mouth daily. 90 tablet 1   Colchicine (MITIGARE) 0.6 MG CAPS Take 1 tablet by mouth 2 (two) times daily. 60 capsule 5   Dietary Management Product (FOSTEUM PLUS) CAPS Take 1 capsule by mouth 2 (two) times daily. 60 capsule 11   ferrous sulfate 325 (65 FE) MG tablet Take 1 tablet (  325 mg total) by mouth 3 (three) times daily with meals. 90 tablet 2   finasteride (PROSCAR) 5 MG tablet TAKE 1 TABLET(5 MG) BY MOUTH DAILY 90 tablet 1   losartan (COZAAR) 100 MG tablet TAKE 1 TABLET BY MOUTH EVERY DAY 90 tablet 2   metoprolol tartrate (LOPRESSOR) 25 MG tablet TAKE 1 TABLET(25 MG) BY MOUTH TWICE DAILY 180 tablet 1   pantoprazole (PROTONIX) 40 MG tablet TAKE 1 TABLET(40 MG) BY MOUTH DAILY 90 tablet 1   Rivaroxaban (XARELTO) 15 MG TABS tablet Take 1 tablet (15 mg total) by mouth daily with supper. 90 tablet 1   rosuvastatin (CRESTOR) 10 MG tablet Take 1 tablet (10 mg total) by mouth daily. 90 tablet 1   SYSTANE ULTRA 0.4-0.3 % SOLN SMARTSIG:1 Drop(s) In Eye(s) As Needed      Tiotropium Bromide-Olodaterol (STIOLTO RESPIMAT) 2.5-2.5 MCG/ACT AERS Inhale 2 puffs into the lungs daily. 12 g 1   torsemide (DEMADEX) 20 MG tablet TAKE 1 TABLET(20 MG) BY MOUTH DAILY 90 tablet 0   vitamin B-12 (CYANOCOBALAMIN) 250 MCG tablet Take 250 mcg by mouth daily.     No current facility-administered medications for this visit.    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  Tobacco Use   Smoking status: Never   Smokeless tobacco: Never  Vaping Use   Vaping Use: Never used  Substance and Sexual Activity   Alcohol use: No    Alcohol/week: 0.0 standard drinks   Drug use: No   Sexual activity: Not Currently  Other Topics Concern   Not on file  Social History Narrative   Lives alone   Regular Exercise -  NO   Social Determinants of Health   Financial Resource Strain: Low Risk    Difficulty of Paying Living Expenses: Not hard at all  Food Insecurity: No Food Insecurity   Worried About Programme researcher, broadcasting/film/video in the Last Year: Never true   Ran Out of Food in the Last Year: Never true  Transportation Needs: No Transportation Needs   Lack of Transportation (Medical): No   Lack of Transportation (Non-Medical): No  Physical Activity: Insufficiently Active   Days of Exercise per Week: 7 days   Minutes of Exercise per Session: 20 min  Stress: No Stress Concern Present   Feeling of Stress : Not at all  Social Connections: Moderately Integrated   Frequency of  Communication with Friends and Family: More than three times a week   Frequency of Social Gatherings with Friends and Family: More than three times a week   Attends Religious Services: More than 4 times per year   Active Member of [employer] or Organizations: No   Attends Engineer, structural: More than 4 times per year   Marital Status: Never married  Intimate Partner Violence: Not on file     Review of Systems: All  other systems reviewed and are otherwise negative except as noted above.  Physical Exam: Vitals:   01/27/22 1012  BP: (!) 142/86  Pulse: 65  SpO2: 96%  Weight: 185 lb (83.9 kg)  Height: 5\' 7"  (1.702 m)    GEN- The patient is well appearing, alert and oriented x 3 today.   HEENT: normocephalic, atraumatic; sclera clear, conjunctiva pink; hearing intact; oropharynx clear; neck supple, no JVP Lymph- no cervical lymphadenopathy Lungs- Clear to ausculation bilaterally, normal work of breathing.  No wheezes, rales, rhonchi Heart- Regular rate and rhythm, no murmurs, rubs or gallops, PMI not laterally displaced GI- soft, non-tender, non-distended, bowel sounds present, no hepatosplenomegaly Extremities- no clubbing, cyanosis, or edema; DP/PT/radial pulses 2+ bilaterally MS- no significant deformity or atrophy Skin- warm and dry, no rash or lesion Psych- euthymic mood, full affect Neuro- strength and sensation are intact  EKG is ordered. Personal review of EKG from today shows AF at 65 bpm.  Additional studies reviewed include: Previous EP office notes.   Assessment and Plan:  1. Permanent atrial fibrillation Rates controlled Continue beta blocker  2. HTN Stable on current regimen  Continue losartan  3. Chronic diastolic CHF EF 50-55% 2018 I attempted to engage the patient regarding sliding scale diuretics and use of torsemide.  I suspect he could get by with less torsemide, if he would abstain from Gatorade.  I recommended he try to take 10 mg torsemide  for several days while more closely monitoring his fluid intake. I suspect he could have better control of his gout as well, if he is able to sue less torsemide, but this will require closer monitoring of fluid and sodium intake.  I suspect he will continue the same regimen since his routine keeps him mostly stable.   Follow up with Dr. Ladona Ridgel in 12 months   Graciella Freer, PA-C  01/27/22 10:40 AM

## 2022-01-27 ENCOUNTER — Ambulatory Visit (INDEPENDENT_AMBULATORY_CARE_PROVIDER_SITE_OTHER): Payer: Medicare Other | Admitting: Student

## 2022-01-27 ENCOUNTER — Other Ambulatory Visit: Payer: Self-pay

## 2022-01-27 ENCOUNTER — Encounter: Payer: Self-pay | Admitting: Student

## 2022-01-27 VITALS — BP 142/86 | HR 65 | Ht 67.0 in | Wt 185.0 lb

## 2022-01-27 DIAGNOSIS — I4819 Other persistent atrial fibrillation: Secondary | ICD-10-CM | POA: Diagnosis not present

## 2022-01-27 DIAGNOSIS — I5032 Chronic diastolic (congestive) heart failure: Secondary | ICD-10-CM

## 2022-01-27 DIAGNOSIS — I10 Essential (primary) hypertension: Secondary | ICD-10-CM | POA: Diagnosis not present

## 2022-01-27 LAB — BASIC METABOLIC PANEL
BUN/Creatinine Ratio: 11 (ref 10–24)
BUN: 20 mg/dL (ref 8–27)
CO2: 27 mmol/L (ref 20–29)
Calcium: 9.3 mg/dL (ref 8.6–10.2)
Chloride: 101 mmol/L (ref 96–106)
Creatinine, Ser: 1.9 mg/dL — ABNORMAL HIGH (ref 0.76–1.27)
Glucose: 84 mg/dL (ref 70–99)
Potassium: 4.6 mmol/L (ref 3.5–5.2)
Sodium: 141 mmol/L (ref 134–144)
eGFR: 37 mL/min/{1.73_m2} — ABNORMAL LOW (ref >59)

## 2022-01-27 NOTE — Patient Instructions (Signed)
Medication Instructions:  ?Your physician recommends that you continue on your current medications as directed. Please refer to the Current Medication list given to you today. ? ?*If you need a refill on your cardiac medications before your next appointment, please call your pharmacy* ? ? ?Lab Work: ?TODAY: BMET ? ?If you have labs (blood work) drawn today and your tests are completely normal, you will receive your results only by: ?MyChart Message (if you have MyChart) OR ?A paper copy in the mail ?If you have any lab test that is abnormal or we need to change your treatment, we will call you to review the results. ? ? ?Follow-Up: ?At [facility], you and your health needs are our priority.  As part of our continuing mission to provide you with exceptional heart care, we have created designated Provider Care Teams.  These Care Teams include your primary Cardiologist (physician) and Advanced Practice Providers (APPs -  Physician Assistants and Nurse Practitioners) who all work together to provide you with the care you need, when you need it. ? ? ?Your next appointment:   ?1 year(s) ? ?The format for your next appointment:   ?In Person ? ?Provider:   ?Cristopher Peru, MD{ ?

## 2022-02-14 ENCOUNTER — Other Ambulatory Visit: Payer: Self-pay | Admitting: Internal Medicine

## 2022-02-14 DIAGNOSIS — I10 Essential (primary) hypertension: Secondary | ICD-10-CM

## 2022-02-21 ENCOUNTER — Ambulatory Visit (INDEPENDENT_AMBULATORY_CARE_PROVIDER_SITE_OTHER): Payer: Medicare Other

## 2022-02-21 ENCOUNTER — Other Ambulatory Visit: Payer: Self-pay | Admitting: Internal Medicine

## 2022-02-21 ENCOUNTER — Other Ambulatory Visit: Payer: Self-pay | Admitting: Physician Assistant

## 2022-02-21 DIAGNOSIS — I4819 Other persistent atrial fibrillation: Secondary | ICD-10-CM

## 2022-02-21 DIAGNOSIS — Z0000 Encounter for general adult medical examination without abnormal findings: Secondary | ICD-10-CM

## 2022-02-21 DIAGNOSIS — I10 Essential (primary) hypertension: Secondary | ICD-10-CM

## 2022-02-21 DIAGNOSIS — M1A39X0 Chronic gout due to renal impairment, multiple sites, without tophus (tophi): Secondary | ICD-10-CM

## 2022-02-21 DIAGNOSIS — J411 Mucopurulent chronic bronchitis: Secondary | ICD-10-CM

## 2022-02-21 NOTE — Progress Notes (Signed)
?I connected with Richard Blake today by telephone and verified that I am speaking with the correct person using two identifiers. ?Location patient: home ?Location provider: work ?Persons participating in the virtual visit: patient, provider. ?  ?I discussed the limitations, risks, security and privacy concerns of performing an evaluation and management service by telephone and the availability of in person appointments. I also discussed with the patient that there may be a patient responsible charge related to this service. The patient expressed understanding and verbally consented to this telephonic visit.  ?  ?Interactive audio and video telecommunications were attempted between this provider and patient, however failed, due to patient having technical difficulties OR patient did not have access to video capability.  We continued and completed visit with audio only. ? ?Some vital signs may be absent or patient reported.  ? ?Time Spent with patient on telephone encounter: 30 minutes ? ?Subjective:  ? Richard Blake is a 76 y.o. male who presents for Medicare Annual/Subsequent preventive examination. ? ?Review of Systems    ? ?Cardiac Risk Factors include: advanced age (>38mn, >>20women);dyslipidemia;family history of premature cardiovascular disease;hypertension;male gender ? ?   ?Objective:  ?  ?There were no vitals filed for this visit. ?There is no height or weight on file to calculate BMI. ? ? ?  02/21/2022  ?  2:49 PM 02/09/2021  ?  2:12 PM 09/23/2019  ? 11:23 AM 09/17/2018  ? 11:52 AM 09/13/2017  ?  2:35 PM 01/29/2017  ?  2:03 PM 12/21/2016  ? 11:26 AM  ?Advanced Directives  ?Does Patient Have a Medical Advance Directive? No No No No No No No  ?Would patient like information on creating a medical advance directive? No - Patient declined No - Patient declined No - Patient declined Yes (ED - Information included in AVS) Yes (ED - Information included in AVS) No - Patient declined No - Patient declined   ? ? ?Current Medications (verified) ?Outpatient Encounter Medications as of 02/21/2022  ?Medication Sig  ? acetaminophen (TYLENOL) 500 MG tablet Take 500-1,000 mg by mouth every 6 (six) hours as needed (for pain from muscle spasms).  ? allopurinol (ZYLOPRIM) 100 MG tablet Take 2 tablets (200 mg total) by mouth daily.  ? Cholecalciferol 50 MCG (2000 UT) TABS Take 1 tablet (2,000 Units total) by mouth daily.  ? Colchicine (MITIGARE) 0.6 MG CAPS Take 1 tablet by mouth 2 (two) times daily.  ? Dietary Management Product (FOSTEUM PLUS) CAPS Take 1 capsule by mouth 2 (two) times daily.  ? ferrous sulfate 325 (65 FE) MG tablet Take 1 tablet (325 mg total) by mouth 3 (three) times daily with meals.  ? finasteride (PROSCAR) 5 MG tablet TAKE 1 TABLET(5 MG) BY MOUTH DAILY  ? losartan (COZAAR) 100 MG tablet TAKE 1 TABLET BY MOUTH EVERY DAY  ? metoprolol tartrate (LOPRESSOR) 25 MG tablet TAKE 1 TABLET(25 MG) BY MOUTH TWICE DAILY  ? pantoprazole (PROTONIX) 40 MG tablet TAKE 1 TABLET(40 MG) BY MOUTH DAILY  ? Rivaroxaban (XARELTO) 15 MG TABS tablet Take 1 tablet (15 mg total) by mouth daily with supper.  ? rosuvastatin (CRESTOR) 10 MG tablet Take 1 tablet (10 mg total) by mouth daily.  ? SYSTANE ULTRA 0.4-0.3 % SOLN SMARTSIG:1 Drop(s) In Eye(s) As Needed  ? Tiotropium Bromide-Olodaterol (STIOLTO RESPIMAT) 2.5-2.5 MCG/ACT AERS Inhale 2 puffs into the lungs daily.  ? torsemide (DEMADEX) 20 MG tablet TAKE 1 TABLET(20 MG) BY MOUTH DAILY  ? vitamin B-12 (CYANOCOBALAMIN) 250 MCG tablet  Take 250 mcg by mouth daily.  ? ?No facility-administered encounter medications on file as of 02/21/2022.  ? ? ?Allergies (verified) ?Amlodipine, Welchol [colesevelam hcl], Other, and Prolia [denosumab]  ? ?History: ?Past Medical History:  ?Diagnosis Date  ? A-fib ([place]) 11/2016  ? Tx with po meds at this time  ? Anemia   ? Microcytic with Fe Sat 4% 10/31/10  ? Atypical chest pain   ? Carpal tunnel syndrome, left   ? Cataract   ? removed bilaterally  ?  Chronic rhinitis   ? Cough   ? Diverticulosis   ? Dyspnea   ? " WITH MY AFIB "  ? GERD (gastroesophageal reflux disease)   ? Hiatal hernia   ? History of recent blood transfusion 06/05/2018  ? HLD (hyperlipidemia)   ? HTN (hypertension)   ? Interstitial lung disease ([place])   ? Kidney stones   ? Osteoarthritis   ? left hand  ? Osteoporosis 2014  ? Sarcoidosis 8/99  ? Transbronchial biopsy, taking daily prednisone since  November 08 on and off since 1999 w/cough every time he relapses off it for more than several weeks  ? Undescended right testicle   ? "shrivled" in childhood from 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  ? ?Past Surgical History:  ?Procedure Laterality Date  ? CARDIOVERSION N/A 12/21/2016  ? Procedure: CARDIOVERSION;  Surgeon: Evans Lance, MD;  Location: [facility];  Service: Cardiovascular;  Laterality: N/A;  ? CARDIOVERSION N/A 01/31/2017  ? Procedure: CARDIOVERSION;  Surgeon: Pixie Casino, MD;  Location: [facility];  Service: Cardiovascular;  Laterality: N/A;  ? CATARACT EXTRACTION Left 10/14/2013  ? CATARACT EXTRACTION Right 11/24/2013  ? CERVICAL FUSION    ? pt states he did not have a cervical fusion, just a biopsy   ? COLONOSCOPY  01/12/2006  ? tics only   ? ENDOVENOUS ABLATION SAPHENOUS VEIN W/ LASER Left 10/23/2018  ? endovenous laser ablation left greater saphenous vein by Ruta Hinds MD   ? ENDOVENOUS ABLATION SAPHENOUS VEIN W/ LASER Right 12/04/2018  ? endovenous laser ablation right greater saphenous vein by Ruta Hinds MD   ? INGUINAL HERNIA REPAIR    ? right  ? LIPOMA EXCISION Left 09/16/2004  ? forearm  ? spinal cord biopsyOther]  1996  ? sarcoid  ? ?Family History  ?Problem Relation Age of Onset  ? Asthma Mother   ? Diabetes Mother   ? Arthritis Paternal Grandmother   ? Heart disease Paternal Grandmother   ? Hypertension Father   ?     Fathers family most  ? Prostate cancer Father   ? Kidney disease Father   ? Colon polyps Sister   ? Multiple myeloma Sister   ? Heart disease Paternal Uncle   ?  Kidney disease Sister   ? Sarcoidosis Neg Hx   ? Other Neg Hx   ?     osteoporosis  ? Colon cancer Neg Hx   ? Esophageal cancer Neg Hx   ? Rectal cancer Neg Hx   ? Stomach cancer Neg Hx   ? ?Social History  ? ?Socioeconomic History  ? Marital status: Single  ?  Spouse name: Not on file  ? Number of children: 0  ? Years of education: Not on file  ? Highest education level: Bachelor's degree (e.g., BA, AB, BS)  ?Occupational History  ? Occupation: Retired  ?  Employer: [employer]  ?Tobacco Use  ? Smoking status: Never  ? Smokeless tobacco: Never  ?Vaping Use  ? Vaping  Use: Never used  ?Substance and Sexual Activity  ? Alcohol use: No  ?  Alcohol/week: 0.0 standard drinks  ? Drug use: No  ? Sexual activity: Not Currently  ?Other Topics Concern  ? Not on file  ?Social History Narrative  ? Lives alone  ? Regular Exercise -  NO  ? ?Social Determinants of Health  ? ?Financial Resource Strain: Low Risk   ? Difficulty of Paying Living Expenses: Not hard at all  ?Food Insecurity: No Food Insecurity  ? Worried About Charity fundraiser in the Last Year: Never true  ? Ran Out of Food in the Last Year: Never true  ?Transportation Needs: No Transportation Needs  ? Lack of Transportation (Medical): No  ? Lack of Transportation (Non-Medical): No  ?Physical Activity: Sufficiently Active  ? Days of Exercise per Week: 7 days  ? Minutes of Exercise per Session: 30 min  ?Stress: No Stress Concern Present  ? Feeling of Stress : Not at all  ?Social Connections: Moderately Integrated  ? Frequency of Communication with Friends and Family: More than three times a week  ? Frequency of Social Gatherings with Friends and Family: More than three times a week  ? Attends Religious Services: More than 4 times per year  ? Active Member of Clubs or Organizations: No  ? Attends Archivist Meetings: More than 4 times per year  ? Marital Status: Never married  ? ? ?Tobacco Counseling ?Counseling given: Not Answered ? ? ?Clinical  Intake: ? ?Pre-visit preparation completed: Yes ? ?Pain : No/denies pain ? ?  ? ?Nutritional Risks: None ?Diabetes: No ? ?How often do you need to have someone help you when you read instructions, pamphlets, or other written m

## 2022-02-21 NOTE — Patient Instructions (Signed)
Richard Blake , ?Thank you for taking time to come for your Medicare Wellness Visit. I appreciate your ongoing commitment to your health goals. Please review the following plan we discussed and let me know if I can assist you in the future.  ? ?Screening recommendations/referrals: ?Colonoscopy: 07/13/2016; due every 10 years ?Recommended yearly ophthalmology/optometry visit for glaucoma screening and checkup ?Recommended yearly dental visit for hygiene and checkup ? ?Vaccinations: ?Influenza vaccine: 09/26/2021 ?Pneumococcal vaccine: 01/12/2015, 01/19/2016 ?Tdap vaccine: 08/11/2020; due every 10 years ?Shingles vaccine: 08/21/2019, 10/28/2019   ?Covid-19: 01/03/2020, 01/27/2020, 08/28/2020, 07/31/2021, 11/10/2021 ? ?Advanced directives: No; working on paperwork ? ?Conditions/risks identified: Yes ? ?Next appointment: Please schedule your next Medicare Wellness Visit with your Nurse Health Advisor in 1 year by calling [phone]. ? ?Preventive Care 85 Years and Older, Male ?Preventive care refers to lifestyle choices and visits with your health care provider that can promote health and wellness. ?What does preventive care include? ?A yearly physical exam. This is also called an annual well check. ?Dental exams once or twice a year. ?Routine eye exams. Ask your health care provider how often you should have your eyes checked. ?Personal lifestyle choices, including: ?Daily care of your teeth and gums. ?Regular physical activity. ?Eating a healthy diet. ?Avoiding tobacco and drug use. ?Limiting alcohol use. ?Practicing safe sex. ?Taking low doses of aspirin every day. ?Taking vitamin and mineral supplements as recommended by your health care provider. ?What happens during an annual well check? ?The services and screenings done by your health care provider during your annual well check will depend on your age, overall health, lifestyle risk factors, and family history of disease. ?Counseling  ?Your health care provider may ask you  questions about your: ?Alcohol use. ?Tobacco use. ?Drug use. ?Emotional well-being. ?Home and relationship well-being. ?Sexual activity. ?Eating habits. ?History of falls. ?Memory and ability to understand (cognition). ?Work and work Statistician. ?Screening  ?You may have the following tests or measurements: ?Height, weight, and BMI. ?Blood pressure. ?Lipid and cholesterol levels. These may be checked every 5 years, or more frequently if you are over 67 years old. ?Skin check. ?Lung cancer screening. You may have this screening every year starting at age 54 if you have a 30-pack-year history of smoking and currently smoke or have quit within the past 15 years. ?Fecal occult blood test (FOBT) of the stool. You may have this test every year starting at age 78. ?Flexible sigmoidoscopy or colonoscopy. You may have a sigmoidoscopy every 5 years or a colonoscopy every 10 years starting at age 14. ?Prostate cancer screening. Recommendations will vary depending on your family history and other risks. ?Hepatitis C blood test. ?Hepatitis B blood test. ?Sexually transmitted disease (STD) testing. ?Diabetes screening. This is done by checking your blood sugar (glucose) after you have not eaten for a while (fasting). You may have this done every 1-3 years. ?Abdominal aortic aneurysm (AAA) screening. You may need this if you are a current or former smoker. ?Osteoporosis. You may be screened starting at age 46 if you are at high risk. ?Talk with your health care provider about your test results, treatment options, and if necessary, the need for more tests. ?Vaccines  ?Your health care provider may recommend certain vaccines, such as: ?Influenza vaccine. This is recommended every year. ?Tetanus, diphtheria, and acellular pertussis (Tdap, Td) vaccine. You may need a Td booster every 10 years. ?Zoster vaccine. You may need this after age 22. ?Pneumococcal 13-valent conjugate (PCV13) vaccine. One dose is recommended after age  65. ?Pneumococcal polysaccharide (PPSV23) vaccine. One dose is recommended after age 59. ?Talk to your health care provider about which screenings and vaccines you need and how often you need them. ?This information is not intended to replace advice given to you by your health care provider. Make sure you discuss any questions you have with your health care provider. ?Document Released: 11/26/2015 Document Revised: 07/19/2016 Document Reviewed: 08/31/2015 ?Elsevier Interactive Patient Education ? 2017 [employer]. ? ?Fall Prevention in the Home ?Falls can cause injuries. They can happen to people of all ages. There are many things you can do to make your home safe and to help prevent falls. ?What can I do on the outside of my home? ?Regularly fix the edges of walkways and driveways and fix any cracks. ?Remove anything that might make you trip as you walk through a door, such as a raised step or threshold. ?Trim any bushes or trees on the path to your home. ?Use bright outdoor lighting. ?Clear any walking paths of anything that might make someone trip, such as rocks or tools. ?Regularly check to see if handrails are loose or broken. Make sure that both sides of any steps have handrails. ?Any raised decks and porches should have guardrails on the edges. ?Have any leaves, snow, or ice cleared regularly. ?Use sand or salt on walking paths during winter. ?Clean up any spills in your garage right away. This includes oil or grease spills. ?What can I do in the bathroom? ?Use night lights. ?Install grab bars by the toilet and in the tub and shower. Do not use towel bars as grab bars. ?Use non-skid mats or decals in the tub or shower. ?If you need to sit down in the shower, use a plastic, non-slip stool. ?Keep the floor dry. Clean up any water that spills on the floor as soon as it happens. ?Remove soap buildup in the tub or shower regularly. ?Attach bath mats securely with double-sided non-slip rug tape. ?Do not have throw  rugs and other things on the floor that can make you trip. ?What can I do in the bedroom? ?Use night lights. ?Make sure that you have a light by your bed that is easy to reach. ?Do not use any sheets or blankets that are too big for your bed. They should not hang down onto the floor. ?Have a firm chair that has side arms. You can use this for support while you get dressed. ?Do not have throw rugs and other things on the floor that can make you trip. ?What can I do in the kitchen? ?Clean up any spills right away. ?Avoid walking on wet floors. ?Keep items that you use a lot in easy-to-reach places. ?If you need to reach something above you, use a strong step stool that has a grab bar. ?Keep electrical cords out of the way. ?Do not use floor polish or wax that makes floors slippery. If you must use wax, use non-skid floor wax. ?Do not have throw rugs and other things on the floor that can make you trip. ?What can I do with my stairs? ?Do not leave any items on the stairs. ?Make sure that there are handrails on both sides of the stairs and use them. Fix handrails that are broken or loose. Make sure that handrails are as long as the stairways. ?Check any carpeting to make sure that it is firmly attached to the stairs. Fix any carpet that is loose or worn. ?Avoid having throw rugs at the  top or bottom of the stairs. If you do have throw rugs, attach them to the floor with carpet tape. ?Make sure that you have a light switch at the top of the stairs and the bottom of the stairs. If you do not have them, ask someone to add them for you. ?What else can I do to help prevent falls? ?Wear shoes that: ?Do not have high heels. ?Have rubber bottoms. ?Are comfortable and fit you well. ?Are closed at the toe. Do not wear sandals. ?If you use a stepladder: ?Make sure that it is fully opened. Do not climb a closed stepladder. ?Make sure that both sides of the stepladder are locked into place. ?Ask someone to hold it for you, if  possible. ?Clearly mark and make sure that you can see: ?Any grab bars or handrails. ?First and last steps. ?Where the edge of each step is. ?Use tools that help you move around (mobility aids) if they are needed

## 2022-05-09 ENCOUNTER — Encounter: Payer: Self-pay | Admitting: Internal Medicine

## 2022-05-09 ENCOUNTER — Ambulatory Visit (INDEPENDENT_AMBULATORY_CARE_PROVIDER_SITE_OTHER): Payer: Medicare Other | Admitting: Internal Medicine

## 2022-05-09 VITALS — BP 130/80 | HR 61 | Temp 97.8°F | Resp 16 | Ht 67.0 in | Wt 179.0 lb

## 2022-05-09 DIAGNOSIS — I10 Essential (primary) hypertension: Secondary | ICD-10-CM

## 2022-05-09 DIAGNOSIS — N401 Enlarged prostate with lower urinary tract symptoms: Secondary | ICD-10-CM

## 2022-05-09 DIAGNOSIS — M1A39X0 Chronic gout due to renal impairment, multiple sites, without tophus (tophi): Secondary | ICD-10-CM

## 2022-05-09 DIAGNOSIS — N183 Chronic kidney disease, stage 3 unspecified: Secondary | ICD-10-CM

## 2022-05-09 DIAGNOSIS — N138 Other obstructive and reflux uropathy: Secondary | ICD-10-CM | POA: Diagnosis not present

## 2022-05-09 DIAGNOSIS — J8410 Pulmonary fibrosis, unspecified: Secondary | ICD-10-CM | POA: Diagnosis not present

## 2022-05-09 DIAGNOSIS — Z23 Encounter for immunization: Secondary | ICD-10-CM | POA: Diagnosis not present

## 2022-05-09 DIAGNOSIS — I4819 Other persistent atrial fibrillation: Secondary | ICD-10-CM

## 2022-05-09 LAB — BASIC METABOLIC PANEL
BUN: 28 mg/dL — ABNORMAL HIGH (ref 6–23)
CO2: 26 mEq/L (ref 19–32)
Calcium: 9.6 mg/dL (ref 8.4–10.5)
Chloride: 102 mEq/L (ref 96–112)
Creatinine, Ser: 1.95 mg/dL — ABNORMAL HIGH (ref 0.40–1.50)
GFR: 33.23 mL/min — ABNORMAL LOW (ref >60.00)
Glucose, Bld: 91 mg/dL (ref 70–99)
Potassium: 4.1 mEq/L (ref 3.5–5.1)
Sodium: 138 mEq/L (ref 135–145)

## 2022-05-09 LAB — CBC WITH DIFFERENTIAL/PLATELET
Basophils Absolute: 0 10*3/uL (ref 0.0–0.1)
Basophils Relative: 1.1 % (ref 0.0–3.0)
Eosinophils Absolute: 0.2 10*3/uL (ref 0.0–0.7)
Eosinophils Relative: 6 % — ABNORMAL HIGH (ref 0.0–5.0)
HCT: 44.3 % (ref 39.0–52.0)
Hemoglobin: 14.2 g/dL (ref 13.0–17.0)
Lymphocytes Relative: 24.2 % (ref 12.0–46.0)
Lymphs Abs: 0.8 10*3/uL (ref 0.7–4.0)
MCHC: 32 g/dL (ref 30.0–36.0)
MCV: 87.2 fl (ref 78.0–100.0)
Monocytes Absolute: 0.3 10*3/uL (ref 0.1–1.0)
Monocytes Relative: 9.7 % (ref 3.0–12.0)
Neutro Abs: 1.9 10*3/uL (ref 1.4–7.7)
Neutrophils Relative %: 59 % (ref 43.0–77.0)
Platelets: 172 10*3/uL (ref 150.0–400.0)
RBC: 5.08 Mil/uL (ref 4.22–5.81)
RDW: 15.6 % — ABNORMAL HIGH (ref 11.5–15.5)
WBC: 3.2 10*3/uL — ABNORMAL LOW (ref 4.0–10.5)

## 2022-05-09 LAB — URINALYSIS, ROUTINE W REFLEX MICROSCOPIC
Bilirubin Urine: NEGATIVE
Hgb urine dipstick: NEGATIVE
Ketones, ur: NEGATIVE
Leukocytes,Ua: NEGATIVE
Nitrite: NEGATIVE
RBC / HPF: NONE SEEN (ref 0–?)
Specific Gravity, Urine: 1.015 (ref 1.000–1.030)
Total Protein, Urine: NEGATIVE
Urine Glucose: NEGATIVE
Urobilinogen, UA: 0.2 (ref 0.0–1.0)
pH: 6 (ref 5.0–8.0)

## 2022-05-09 LAB — PSA: PSA: 3.5 ng/mL (ref 0.10–4.00)

## 2022-05-09 LAB — URIC ACID: Uric Acid, Serum: 6.6 mg/dL (ref 4.0–7.8)

## 2022-05-09 MED ORDER — RIVAROXABAN 15 MG PO TABS: 15.0000 mg | ORAL_TABLET | Freq: Every day | ORAL | 1 refills | Status: DC

## 2022-05-09 NOTE — Progress Notes (Signed)
Subjective:  Patient ID: Richard Blake, male    DOB: 11-08-1946  Age: 76 y.o. MRN: [id number]  CC: Hypertension   HPI Richard Blake presents for f/up -  He continues to complain of nonproductive cough and requests a referral to pulmonary.  He denies chest pain, diaphoresis, or palpitations.  History Richard Blake has a past medical history of A-fib ([place]) (11/2016), Anemia, Atypical chest pain, Carpal tunnel syndrome, left, Cataract, Chronic rhinitis, Cough, Diverticulosis, Dyspnea, GERD (gastroesophageal reflux disease), Hiatal hernia, History of recent blood transfusion (06/05/2018), HLD (hyperlipidemia), HTN (hypertension), Interstitial lung disease ([place]), Kidney stones, Osteoarthritis, Osteoporosis (2014), Sarcoidosis (8/99), and Undescended right testicle.   He has a past surgical history that includes Cervical fusion; spinal cord biopsyOther] (1996); Lipoma excision (Left, 09/16/2004); Cataract extraction (Left, 10/14/2013); Cataract extraction (Right, 11/24/2013); Inguinal hernia repair; Colonoscopy (01/12/2006); Cardioversion (N/A, 12/21/2016); Cardioversion (N/A, 01/31/2017); Endovenous ablation saphenous vein w/ laser (Left, 10/23/2018); and Endovenous ablation saphenous vein w/ laser (Right, 12/04/2018).   His family history includes Arthritis in his paternal grandmother; Asthma in his mother; Colon polyps in his sister; Diabetes in his mother; Heart disease in his paternal grandmother and paternal uncle; Hypertension in his father; Kidney disease in his father and sister; Multiple myeloma in his sister; Prostate cancer in his father.He reports that he has never smoked. He has never used smokeless tobacco. He reports that he does not drink alcohol and does not use drugs.  Outpatient Medications Prior to Visit  Medication Sig Dispense Refill   acetaminophen (TYLENOL) 500 MG tablet Take 500-1,000 mg by mouth every 6 (six) hours as needed (for pain from muscle spasms).     allopurinol  (ZYLOPRIM) 100 MG tablet TAKE 2 TABLETS(200 MG) BY MOUTH DAILY 180 tablet 0   Cholecalciferol 50 MCG (2000 UT) TABS Take 1 tablet (2,000 Units total) by mouth daily. 90 tablet 1   Colchicine (MITIGARE) 0.6 MG CAPS Take 1 tablet by mouth 2 (two) times daily. 60 capsule 5   Dietary Management Product (FOSTEUM PLUS) CAPS Take 1 capsule by mouth 2 (two) times daily. 60 capsule 11   ferrous sulfate 325 (65 FE) MG tablet Take 1 tablet (325 mg total) by mouth 3 (three) times daily with meals. 90 tablet 2   finasteride (PROSCAR) 5 MG tablet TAKE 1 TABLET(5 MG) BY MOUTH DAILY 90 tablet 1   losartan (COZAAR) 100 MG tablet TAKE 1 TABLET BY MOUTH EVERY DAY 90 tablet 2   metoprolol tartrate (LOPRESSOR) 25 MG tablet TAKE 1 TABLET(25 MG) BY MOUTH TWICE DAILY 180 tablet 1   pantoprazole (PROTONIX) 40 MG tablet TAKE 1 TABLET(40 MG) BY MOUTH DAILY 90 tablet 1   rosuvastatin (CRESTOR) 10 MG tablet Take 1 tablet (10 mg total) by mouth daily. 90 tablet 1   STIOLTO RESPIMAT 2.5-2.5 MCG/ACT AERS INHALE 2 PUFFS INTO THE LUNGS DAILY 12 g 1   SYSTANE ULTRA 0.4-0.3 % SOLN SMARTSIG:1 Drop(s) In Eye(s) As Needed     torsemide (DEMADEX) 20 MG tablet TAKE 1 TABLET(20 MG) BY MOUTH DAILY 90 tablet 0   vitamin B-12 (CYANOCOBALAMIN) 250 MCG tablet Take 250 mcg by mouth daily.     Rivaroxaban (XARELTO) 15 MG TABS tablet Take 1 tablet (15 mg total) by mouth daily with supper. 90 tablet 1   No facility-administered medications prior to visit.    ROS Review of Systems  Constitutional: Negative.  Negative for chills, diaphoresis, fatigue and fever.  HENT: Negative.    Eyes: Negative.   Respiratory:  Positive for cough and shortness of breath. Negative for chest tightness and wheezing.   Cardiovascular: Negative.  Negative for chest pain, palpitations and leg swelling.  Gastrointestinal:  Negative for abdominal pain, constipation, diarrhea, nausea and vomiting.  Endocrine: Negative.   Genitourinary: Negative.  Negative for  difficulty urinating.  Musculoskeletal:  Negative for myalgias.  Skin: Negative.   Neurological:  Negative for dizziness, weakness and headaches.  Hematological:  Negative for adenopathy. Does not bruise/bleed easily.  Psychiatric/Behavioral: Negative.      Objective:  BP 130/80 (BP Location: Right Arm, Patient Position: Sitting, Cuff Size: Large)   Pulse 61   Temp 97.8 F (36.6 C) (Oral)   Resp 16   Ht '5\' 7"'  (1.702 m)   Wt 179 lb (81.2 kg)   SpO2 94%   BMI 28.04 kg/m   Physical Exam Vitals reviewed.  HENT:     Nose: Nose normal.     Mouth/Throat:     Mouth: Mucous membranes are moist.  Eyes:     General: No scleral icterus.    Conjunctiva/sclera: Conjunctivae normal.  Cardiovascular:     Rate and Rhythm: Normal rate. Rhythm irregularly irregular.     Heart sounds: Normal heart sounds, S1 normal and S2 normal. No murmur heard.    Comments: Trace pitting edema BLE Pulmonary:     Effort: Pulmonary effort is normal.     Breath sounds: No stridor. No wheezing, rhonchi or rales.  Abdominal:     General: Abdomen is flat.     Palpations: There is no mass.     Tenderness: There is no abdominal tenderness. There is no guarding.     Hernia: No hernia is present.  Musculoskeletal:        General: Normal range of motion.     Cervical back: Neck supple.     Right lower leg: No edema.     Left lower leg: No edema.  Skin:    General: Skin is warm and dry.  Neurological:     General: No focal deficit present.     Mental Status: He is alert.  Psychiatric:        Mood and Affect: Mood normal.        Behavior: Behavior normal.     Lab Results  Component Value Date   WBC 3.2 (L) 05/09/2022   HGB 14.2 05/09/2022   HCT 44.3 05/09/2022   PLT 172.0 05/09/2022   GLUCOSE 91 05/09/2022   CHOL 189 09/26/2021   TRIG 133.0 09/26/2021   HDL 57.20 09/26/2021   LDLDIRECT 186.5 10/23/2012   LDLCALC 105 (H) 09/26/2021   ALT 15 04/22/2018   AST 15 04/22/2018   NA 138 05/09/2022    K 4.1 05/09/2022   CL 102 05/09/2022   CREATININE 1.95 (H) 05/09/2022   BUN 28 (H) 05/09/2022   CO2 26 05/09/2022   TSH 0.98 09/26/2021   PSA 3.50 05/09/2022   INR 1.1 12/14/2016     Assessment & Plan:   Richard Blake was seen today for hypertension.  Diagnoses and all orders for this visit:  BPH with obstruction/lower urinary tract symptoms- His PSA is lower than it was a previously. -     PSA; Future -     Urinalysis, Routine w reflex microscopic; Future -     Urinalysis, Routine w reflex microscopic -     PSA  INTERSTITIAL LUNG DISEASE -     Ambulatory referral to Pulmonology  Essential hypertension- His blood pressure is adequately well  controlled. -     Basic metabolic panel; Future -     CBC with Differential/Platelet; Future -     CBC with Differential/Platelet -     Basic metabolic panel  Persistent atrial fibrillation-we will continue the DOAC. -     Rivaroxaban (XARELTO) 15 MG TABS tablet; Take 1 tablet (15 mg total) by mouth daily with supper.  Chronic gout due to renal impairment of multiple sites without tophus-he has achieved his uric acid goal. -     Uric acid; Future -     Uric acid  Need for vaccination -     Pneumococcal polysaccharide vaccine 23-valent greater than or equal to 2yo subcutaneous/IM  CRI (chronic renal insufficiency), stage 3 (moderate)- His renal function has declined. Will refer to nephrology.  Persistent atrial fibrillation ([facility]) -     Rivaroxaban (XARELTO) 15 MG TABS tablet; Take 1 tablet (15 mg total) by mouth daily with supper.   I am having Richard Blake. Richard Blake maintain his vitamin B-12, acetaminophen, Fosteum Plus, Cholecalciferol, ferrous sulfate, Colchicine, Systane Ultra, finasteride, rosuvastatin, pantoprazole, torsemide, losartan, allopurinol, Stiolto Respimat, metoprolol tartrate, and Rivaroxaban.  Meds ordered this encounter  Medications   Rivaroxaban (XARELTO) 15 MG TABS tablet    Sig: Take 1 tablet (15 mg total) by mouth  daily with supper.    Dispense:  90 tablet    Refill:  1     Follow-up: Return in about 6 months (around 11/08/2022).  Scarlette Calico, MD

## 2022-05-12 ENCOUNTER — Encounter: Payer: Self-pay | Admitting: Internal Medicine

## 2022-06-27 DIAGNOSIS — M5136 Other intervertebral disc degeneration, lumbar region: Secondary | ICD-10-CM | POA: Diagnosis not present

## 2022-06-27 DIAGNOSIS — M9905 Segmental and somatic dysfunction of pelvic region: Secondary | ICD-10-CM | POA: Diagnosis not present

## 2022-06-27 DIAGNOSIS — M9903 Segmental and somatic dysfunction of lumbar region: Secondary | ICD-10-CM | POA: Diagnosis not present

## 2022-06-27 DIAGNOSIS — M9904 Segmental and somatic dysfunction of sacral region: Secondary | ICD-10-CM | POA: Diagnosis not present

## 2022-07-03 ENCOUNTER — Telehealth: Payer: Self-pay | Admitting: Orthopedic Surgery

## 2022-07-03 NOTE — Telephone Encounter (Signed)
Patient called in stating he got a letter that Dr. Tempie Donning is leaving and he would like the information on where he is relocating so he may continue treatment apparently [place] told him to think about if he wanted surgery or not and he has decided that he does and would like for him to do it. He would like a call or MyChart message of his new location so he may get his records sent over

## 2022-07-04 ENCOUNTER — Encounter: Payer: Self-pay | Admitting: Radiology

## 2022-07-04 NOTE — Telephone Encounter (Signed)
Sent mychart message to patient with info.

## 2022-07-10 ENCOUNTER — Encounter: Payer: Self-pay | Admitting: Pulmonary Disease

## 2022-07-10 ENCOUNTER — Ambulatory Visit (INDEPENDENT_AMBULATORY_CARE_PROVIDER_SITE_OTHER): Payer: Medicare Other | Admitting: Pulmonary Disease

## 2022-07-10 VITALS — BP 142/66 | HR 65 | Temp 97.8°F | Ht 67.0 in | Wt 176.6 lb

## 2022-07-10 DIAGNOSIS — J849 Interstitial pulmonary disease, unspecified: Secondary | ICD-10-CM | POA: Diagnosis not present

## 2022-07-10 NOTE — Patient Instructions (Addendum)
We will schedule high-resolution CT and pulmonary function test for better evaluation of the lungs Follow-up in clinic in 2 to 2 months for review and plan for next steps

## 2022-07-10 NOTE — Progress Notes (Signed)
Richard Blake    [id number]    07/19/1946  Primary Care Physician:Jones, Arvid Right, MD  Referring Physician: Janith Lima, MD [address] [place],  [place] [address]  Chief complaint: Consult for interstitial lung disease  HPI: 76 y.o. who  has a past medical history of A-fib ([employer]) (11/2016), Anemia, Atypical chest pain, Carpal tunnel syndrome, left, Cataract, Chronic rhinitis, Cough, Diverticulosis, Dyspnea, GERD (gastroesophageal reflux disease), Hiatal hernia, History of recent blood transfusion (06/05/2018), HLD (hyperlipidemia), HTN (hypertension), Interstitial lung disease ([place]), Kidney stones, Osteoarthritis, Osteoporosis (2014), Sarcoidosis (8/99), and Undescended right testicle.   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  He had spinal tissue biopsy in 1986 and a transbronchial lung biopsy in August 1999.  He was followed at that time by Dr. Melvyn Novas and treated with steroids from 1998 to 2009 when he was weaned off steroids  His chief complaint today is cough with chest congestion that started in 2022.  He relates this temporally to the current dose of COVID-19.Marland Kitchen  He is having a chest x-ray for respiratory failure that showed mild interstitial changes and has been sent in for further evaluation.  He has been started on stiolto inhaler by primary care but cannot tell if it is helping and feels it is too expensive  Pets: No pets Occupation: Retired Art therapist Exposures: No mold, hot tub, Jacuzzi.  No feather pillows or comforters. ILD questionnaire 07/10/2022-negative Smoking history: Never smoker Travel history: No significant lung disease Relevant family history: Father had TB in the 1950s   Outpatient Encounter Medications as of 07/10/2022  Medication Sig   acetaminophen (TYLENOL) 500 MG tablet Take 500-1,000 mg by mouth every 6 (six) hours as needed (for  pain from muscle spasms).   allopurinol (ZYLOPRIM) 100 MG tablet TAKE 2 TABLETS(200 MG) BY MOUTH DAILY   Cholecalciferol 50 MCG (2000 UT) TABS Take 1 tablet (2,000 Units total) by mouth daily.   Colchicine (MITIGARE) 0.6 MG CAPS Take 1 tablet by mouth 2 (two) times daily.   Dietary Management Product (FOSTEUM PLUS) CAPS Take 1 capsule by mouth 2 (two) times daily.   ferrous sulfate 325 (65 FE) MG tablet Take 1 tablet (325 mg total) by mouth 3 (three) times daily with meals.   finasteride (PROSCAR) 5 MG tablet TAKE 1 TABLET(5 MG) BY MOUTH DAILY   losartan (COZAAR) 100 MG tablet TAKE 1 TABLET BY MOUTH EVERY DAY   metoprolol tartrate (LOPRESSOR) 25 MG tablet TAKE 1 TABLET(25 MG) BY MOUTH TWICE DAILY   pantoprazole (PROTONIX) 40 MG tablet TAKE 1 TABLET(40 MG) BY MOUTH DAILY   Rivaroxaban (XARELTO) 15 MG TABS tablet Take 1 tablet (15 mg total) by mouth daily with supper.   rosuvastatin (CRESTOR) 10 MG tablet Take 1 tablet (10 mg total) by mouth daily.   STIOLTO RESPIMAT 2.5-2.5 MCG/ACT AERS INHALE 2 PUFFS INTO THE LUNGS DAILY   SYSTANE ULTRA 0.4-0.3 % SOLN SMARTSIG:1 Drop(s) In Eye(s) As Needed   torsemide (DEMADEX) 20 MG tablet TAKE 1 TABLET(20 MG) BY MOUTH DAILY   vitamin B-12 (CYANOCOBALAMIN) 250 MCG tablet Take 250 mcg by mouth daily.   No facility-administered encounter medications on file as of 07/10/2022.    Allergies as of 07/10/2022 - Review Complete 07/10/2022  Allergen Reaction Noted   Amlodipine Other (See Comments) 11/23/2016   Welchol [colesevelam hcl] Other (See Comments) 08/28/2013   Other Swelling 06/28/2016   Prolia [  denosumab] Other (See Comments) 01/22/2013    Past Medical History:  Diagnosis Date   A-fib ([place]) 11/2016   Tx with po meds at this time   Anemia    Microcytic with Fe Sat 4% 10/31/10   Atypical chest pain    Carpal tunnel syndrome, left    Cataract    removed bilaterally   Chronic rhinitis    Cough    Diverticulosis    Dyspnea    " WITH MY AFIB "    GERD (gastroesophageal reflux disease)    Hiatal hernia    History of recent blood transfusion 06/05/2018   HLD (hyperlipidemia)    HTN (hypertension)    Interstitial lung disease ([place])    Kidney stones    Osteoarthritis    left hand   Osteoporosis 2014   Sarcoidosis 8/99   Transbronchial biopsy, taking daily prednisone since  November 08 on and off since 1999 w/cough every time he relapses off it for more than several weeks   Undescended right testicle    "shrivled" in childhood from 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    Past Surgical History:  Procedure Laterality Date   CARDIOVERSION N/A 12/21/2016   Procedure: CARDIOVERSION;  Surgeon: Evans Lance, MD;  Location: [facility] ENDOSCOPY;  Service: Cardiovascular;  Laterality: N/A;   CARDIOVERSION N/A 01/31/2017   Procedure: CARDIOVERSION;  Surgeon: Pixie Casino, MD;  Location: [facility] ENDOSCOPY;  Service: Cardiovascular;  Laterality: N/A;   CATARACT EXTRACTION Left 10/14/2013   CATARACT EXTRACTION Right 11/24/2013   CERVICAL FUSION     pt states he did not have a cervical fusion, just a biopsy    COLONOSCOPY  01/12/2006   tics only    ENDOVENOUS ABLATION SAPHENOUS VEIN W/ LASER Left 10/23/2018   endovenous laser ablation left greater saphenous vein by Ruta Hinds MD    ENDOVENOUS ABLATION SAPHENOUS VEIN W/ LASER Right 12/04/2018   endovenous laser ablation right greater saphenous vein by Ruta Hinds MD    [place]     right   LIPOMA EXCISION Left 09/16/2004   forearm   spinal cord biopsyOther]  1996   sarcoid    Family History  Problem Relation Age of Onset   Asthma Mother    Diabetes Mother    Arthritis Paternal Grandmother    Heart disease Paternal Grandmother    Hypertension Father        Fathers family most   Prostate cancer Father    Kidney disease Father    Colon polyps Sister    Multiple myeloma Sister    Heart disease Paternal Uncle    Kidney disease Sister    Sarcoidosis Neg Hx    Other Neg Hx         osteoporosis   Colon cancer Neg Hx    Esophageal cancer Neg Hx    Rectal cancer Neg Hx    Stomach cancer Neg Hx     Social History   Socioeconomic History   Marital status: Single    Spouse name: Not on file   Number of children: 0   Years of education: Not on file   Highest education level: Bachelor's degree (e.g., BA, AB, BS)  Occupational History   Occupation: Retired    Fish farm manager: Psychologist, sport and exercise  Tobacco Use   Smoking status: Never   Smokeless tobacco: Never  Vaping Use   Vaping Use: Never used  Substance and Sexual Activity   Alcohol use: No    Alcohol/week: 0.0 standard drinks of alcohol  Drug use: No   Sexual activity: Not Currently  Other Topics Concern   Not on file  Social History Narrative   Lives alone   Regular Exercise -  NO   Social Determinants of Health   Financial Resource Strain: Low Risk  (02/21/2022)   Overall Financial Resource Strain (CARDIA)    Difficulty of Paying Living Expenses: Not hard at all  Food Insecurity: No Food Insecurity (02/21/2022)   Hunger Vital Sign    Worried About Running Out of Food in the Last Year: Never true    Ran Out of Food in the Last Year: Never true  Transportation Needs: No Transportation Needs (02/21/2022)   PRAPARE - Hydrologist (Medical): No    Lack of Transportation (Non-Medical): No  Physical Activity: Sufficiently Active (02/21/2022)   Exercise Vital Sign    Days of Exercise per Week: 7 days    Minutes of Exercise per Session: 30 min  Stress: No Stress Concern Present (02/21/2022)   [place]    Feeling of Stress : Not at all  Social Connections: Moderately Integrated (02/21/2022)   Social Connection and Isolation Panel [NHANES]    Frequency of Communication with Friends and Family: More than three times a week    Frequency of Social Gatherings with Friends and Family: More than three times a week    Attends  Religious Services: More than 4 times per year    Active Member of [employer] or Organizations: No    Attends Music therapist: More than 4 times per year    Marital Status: Never married  Intimate Partner Violence: Not At Risk (02/21/2022)   Humiliation, Afraid, Rape, and Kick questionnaire    Fear of Current or Ex-Partner: No    Emotionally Abused: No    Physically Abused: No    Sexually Abused: No    Review of systems: Review of Systems  Constitutional: Negative for fever and chills.  HENT: Negative.   Eyes: Negative for blurred vision.  Respiratory: as per HPI  Cardiovascular: Negative for chest pain and palpitations.  Gastrointestinal: Negative for vomiting, diarrhea, blood per rectum. Genitourinary: Negative for dysuria, urgency, frequency and hematuria.  Musculoskeletal: Negative for myalgias, back pain and joint pain.  Skin: Negative for itching and rash.  Neurological: Negative for dizziness, tremors, focal weakness, seizures and loss of consciousness.  Endo/Heme/Allergies: Negative for environmental allergies.  Psychiatric/Behavioral: Negative for depression, suicidal ideas and hallucinations.  All other systems reviewed and are negative.  Physical Exam: Blood pressure (!) 142/66, pulse 65, temperature 97.8 F (36.6 C), temperature source Oral, height $RemoveBefo'5\' 7"'FumgntUVumk$  (1.702 m), weight 176 lb 9.6 oz (80.1 kg), SpO2 100 %. Gen:      No acute distress HEENT:  EOMI, sclera anicteric Neck:     No masses; no thyromegaly Lungs:    Clear to auscultation bilaterally; normal respiratory effort CV:         Regular rate and rhythm; no murmurs Abd:      + bowel sounds; soft, non-tender; no palpable masses, no distension Ext:    No edema; adequate peripheral perfusion Skin:      Warm and dry; no rash Neuro: alert and oriented x 3 Psych: normal mood and affect  Data Reviewed: Imaging: Chest x-ray 11/27/2018-chronic interstitial changes, left hemidiaphragm elevation, bibasilar  ectasis Chest x-ray 01/24/2021-chronic interstitial changes and scattered nodularity I reviewed images personally.  PFTs: 07/06/2016 FVC 3.19 [101%], FEV1 2.62 [  100%], F/F 82, TLC 6.11 [101%], DLCO 16.38 [63%] Mild diffusion defect  Labs:  Assessment:  Evaluation for interstitial lung disease He has history of sarcoidosis which appears to be in remission since 2000 Recent chest x-ray reviewed with chronic interstitial changes We will schedule high-resolution CT and PFTs for better evaluation of the lung  Chronic cough, congestion Try over-the-counter Mucinex We will review PFTs and determine if we need to continue to stiolto inhaler  Plan/Recommendations: CT, PFTs  Marshell Garfinkel MD [facility] 07/10/2022, 11:23 AM  CC: Janith Lima, MD

## 2022-07-18 ENCOUNTER — Telehealth: Payer: Self-pay | Admitting: Pulmonary Disease

## 2022-07-21 DIAGNOSIS — M109 Gout, unspecified: Secondary | ICD-10-CM | POA: Diagnosis not present

## 2022-07-21 DIAGNOSIS — D509 Iron deficiency anemia, unspecified: Secondary | ICD-10-CM | POA: Diagnosis not present

## 2022-07-21 DIAGNOSIS — N183 Chronic kidney disease, stage 3 unspecified: Secondary | ICD-10-CM | POA: Diagnosis not present

## 2022-07-21 DIAGNOSIS — N189 Chronic kidney disease, unspecified: Secondary | ICD-10-CM | POA: Diagnosis not present

## 2022-07-21 DIAGNOSIS — I129 Hypertensive chronic kidney disease with stage 1 through stage 4 chronic kidney disease, or unspecified chronic kidney disease: Secondary | ICD-10-CM | POA: Diagnosis not present

## 2022-07-21 DIAGNOSIS — I480 Paroxysmal atrial fibrillation: Secondary | ICD-10-CM | POA: Diagnosis not present

## 2022-07-24 NOTE — Telephone Encounter (Signed)
I called and spoke to the patient he stated he saw a little bit of blood when he coughed and that's why he was wanting it moved up, but he hasn't seen any since then. He said he felt like he can wait now. I gave him [place] imaging's number incase so he could call if he was to need to change his appointment. Nothing further needed at this time

## 2022-08-18 DIAGNOSIS — Z23 Encounter for immunization: Secondary | ICD-10-CM | POA: Diagnosis not present

## 2022-08-24 ENCOUNTER — Encounter: Payer: Self-pay | Admitting: Internal Medicine

## 2022-08-29 ENCOUNTER — Ambulatory Visit
Admission: RE | Admit: 2022-08-29 | Discharge: 2022-08-29 | Disposition: A | Discharge status: Home / Self Care | Payer: Medicare Other | Source: Ambulatory Visit | Attending: Pulmonary Disease | Admitting: Pulmonary Disease

## 2022-08-29 DIAGNOSIS — I700 Atherosclerosis of aorta: Secondary | ICD-10-CM | POA: Diagnosis not present

## 2022-08-29 DIAGNOSIS — K449 Diaphragmatic hernia without obstruction or gangrene: Secondary | ICD-10-CM | POA: Diagnosis not present

## 2022-08-29 DIAGNOSIS — J84112 Idiopathic pulmonary fibrosis: Secondary | ICD-10-CM | POA: Diagnosis not present

## 2022-08-29 DIAGNOSIS — R918 Other nonspecific abnormal finding of lung field: Secondary | ICD-10-CM | POA: Diagnosis not present

## 2022-08-29 DIAGNOSIS — J849 Interstitial pulmonary disease, unspecified: Secondary | ICD-10-CM

## 2022-09-04 ENCOUNTER — Ambulatory Visit (INDEPENDENT_AMBULATORY_CARE_PROVIDER_SITE_OTHER): Payer: Medicare Other | Admitting: Pulmonary Disease

## 2022-09-04 ENCOUNTER — Encounter: Payer: Self-pay | Admitting: Pulmonary Disease

## 2022-09-04 VITALS — BP 140/60 | HR 68 | Temp 97.8°F | Ht 65.0 in | Wt 177.4 lb

## 2022-09-04 DIAGNOSIS — J849 Interstitial pulmonary disease, unspecified: Secondary | ICD-10-CM | POA: Diagnosis not present

## 2022-09-04 DIAGNOSIS — D869 Sarcoidosis, unspecified: Secondary | ICD-10-CM

## 2022-09-04 LAB — PULMONARY FUNCTION TEST
DL/VA % pred: 72 %
DL/VA: 2.95 ml/min/mmHg/L
DLCO cor % pred: 57 %
DLCO cor: 12.46 ml/min/mmHg
DLCO unc % pred: 57 %
DLCO unc: 12.46 ml/min/mmHg
FEF 25-75 Post: 1.37 L/sec
FEF 25-75 Pre: 1.24 L/sec
FEF2575-%Change-Post: 10 %
FEF2575-%Pred-Post: 75 %
FEF2575-%Pred-Pre: 67 %
FEV1-%Change-Post: 2 %
FEV1-%Pred-Post: 67 %
FEV1-%Pred-Pre: 66 %
FEV1-Post: 1.68 L
FEV1-Pre: 1.64 L
FEV1FVC-%Change-Post: 8 %
FEV1FVC-%Pred-Pre: 102 %
FEV6-%Change-Post: -6 %
FEV6-%Pred-Post: 63 %
FEV6-%Pred-Pre: 67 %
FEV6-Post: 2.04 L
FEV6-Pre: 2.18 L
FEV6FVC-%Change-Post: 0 %
FEV6FVC-%Pred-Post: 106 %
FEV6FVC-%Pred-Pre: 106 %
FVC-%Change-Post: -5 %
FVC-%Pred-Post: 59 %
FVC-%Pred-Pre: 63 %
FVC-Post: 2.07 L
FVC-Pre: 2.2 L
Post FEV1/FVC ratio: 81 %
Post FEV6/FVC ratio: 99 %
Pre FEV1/FVC ratio: 75 %
Pre FEV6/FVC Ratio: 99 %
RV % pred: 106 %
RV: 2.42 L
TLC % pred: 75 %
TLC: 4.58 L

## 2022-09-04 NOTE — Patient Instructions (Signed)
I reviewed his CT scan which shows inactive sarcoid.  There is some reduction of lung capacity which we will continue to monitor He can stop the stiolto inhaler Follow-up in 6 months.

## 2022-09-04 NOTE — Patient Instructions (Signed)
Full PFT Performed Today  

## 2022-09-04 NOTE — Progress Notes (Signed)
Full PFT Performed Today  

## 2022-09-04 NOTE — Progress Notes (Signed)
Richard Blake    [id number]    1946/07/12  Primary Care Physician:Jones, Arvid Right, MD  Referring Physician: Janith Lima, MD [address] [place],  [place] [address]  Chief complaint: Follow up for interstitial lung disease, sarcoidosis  HPI: 76 y.o. who  has a past medical history of A-fib ([place]) (11/2016), Anemia, Atypical chest pain, Carpal tunnel syndrome, left, Cataract, Chronic rhinitis, Cough, Diverticulosis, Dyspnea, GERD (gastroesophageal reflux disease), Hiatal hernia, History of recent blood transfusion (06/05/2018), HLD (hyperlipidemia), HTN (hypertension), Interstitial lung disease ([place]), Kidney stones, Osteoarthritis, Osteoporosis (2014), Sarcoidosis (8/99), and Undescended right testicle.   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  He had spinal tissue biopsy in 1986 and a transbronchial lung biopsy in August 1999.  He was followed at that time by Dr. Melvyn Novas and treated with steroids from 1998 to 2009 when he was weaned off steroids  His chief complaint today is cough with chest congestion that started in 2022.  He relates this temporally to the current dose of COVID-19.Marland Kitchen  He is having a chest x-ray for respiratory failure that showed mild interstitial changes and has been sent in for further evaluation.  He has been started on stiolto inhaler by primary care but cannot tell if it is helping and feels it is too expensive  Pets: No pets Occupation: Retired Art therapist Exposures: No mold, hot tub, Jacuzzi.  No feather pillows or comforters. ILD questionnaire 07/10/2022-negative Smoking history: Never smoker Travel history: No significant lung disease Relevant family history: Father had TB in the 39s  Interim history: He is here for review of CT and PFTs States that breathing is stable Dealing with intermittent A-fib and is due to follow-up with  cardiology for that.  Outpatient Encounter Medications as of 09/04/2022  Medication Sig   acetaminophen (TYLENOL) 500 MG tablet Take 500-1,000 mg by mouth every 6 (six) hours as needed (for pain from muscle spasms).   allopurinol (ZYLOPRIM) 100 MG tablet TAKE 2 TABLETS(200 MG) BY MOUTH DAILY   Cholecalciferol 50 MCG (2000 UT) TABS Take 1 tablet (2,000 Units total) by mouth daily.   Colchicine (MITIGARE) 0.6 MG CAPS Take 1 tablet by mouth 2 (two) times daily.   Dietary Management Product (FOSTEUM PLUS) CAPS Take 1 capsule by mouth 2 (two) times daily.   ferrous sulfate 325 (65 FE) MG tablet Take 1 tablet (325 mg total) by mouth 3 (three) times daily with meals.   finasteride (PROSCAR) 5 MG tablet TAKE 1 TABLET(5 MG) BY MOUTH DAILY   losartan (COZAAR) 100 MG tablet TAKE 1 TABLET BY MOUTH EVERY DAY   metoprolol tartrate (LOPRESSOR) 25 MG tablet TAKE 1 TABLET(25 MG) BY MOUTH TWICE DAILY   pantoprazole (PROTONIX) 40 MG tablet TAKE 1 TABLET(40 MG) BY MOUTH DAILY   Rivaroxaban (XARELTO) 15 MG TABS tablet Take 1 tablet (15 mg total) by mouth daily with supper.   rosuvastatin (CRESTOR) 10 MG tablet Take 1 tablet (10 mg total) by mouth daily.   STIOLTO RESPIMAT 2.5-2.5 MCG/ACT AERS INHALE 2 PUFFS INTO THE LUNGS DAILY   SYSTANE ULTRA 0.4-0.3 % SOLN SMARTSIG:1 Drop(s) In Eye(s) As Needed   torsemide (DEMADEX) 20 MG tablet TAKE 1 TABLET(20 MG) BY MOUTH DAILY   vitamin B-12 (CYANOCOBALAMIN) 250 MCG tablet Take 250 mcg by mouth daily.   No facility-administered encounter medications on file as of 09/04/2022.   Physical Exam: Blood pressure (!) 140/60,  pulse 68, temperature 97.8 F (36.6 C), temperature source Oral, height '5\' 5"'$  (1.651 m), weight 177 lb 6.4 oz (80.5 kg), SpO2 100 %. Gen:      No acute distress HEENT:  EOMI, sclera anicteric Neck:     No masses; no thyromegaly Lungs:    Clear to auscultation bilaterally; normal respiratory effort CV:         Regular rate and rhythm; no murmurs Abd:       + bowel sounds; soft, non-tender; no palpable masses, no distension Ext:    No edema; adequate peripheral perfusion Skin:      Warm and dry; no rash Neuro: alert and oriented x 3 Psych: normal mood and affect   Data Reviewed: Imaging: Chest x-ray 11/27/2018-chronic interstitial changes, left hemidiaphragm elevation, bibasilar ectasis Chest x-ray 01/24/2021-chronic interstitial changes and scattered nodularity High-resolution CT 08/29/2022-upper lobe reticular opacities with honeycomb changes, bilateral air-trapping, large hiatal hernia, aortic atherosclerosis. I reviewed images personally.  PFTs: 07/06/2016 FVC 3.19 [101%], FEV1 2.62 [100%], F/F 82, TLC 6.11 [101%], DLCO 16.38 [63%] Mild diffusion defect  09/04/2022 FVC 2.07 [59%], FEV1 1.68 [6 7%], F/F 81, TLC 4.58 [95%], DLCO 12.46 [57%] Mild restriction, moderate diffusion defect  Labs:  Assessment:  Evaluation for interstitial lung disease He has history of sarcoidosis which appears to be in remission since 2000 CT reviewed with chronic fibrotic changes secondary to sarcoid.  There does not appear to be any active inflammation.  Chronic cough, congestion Over-the-counter Mucinex.  PFTs do not show any obstruction.  He can stop the stiolto as he is not seeing any benefit to it.  Plan/Recommendations: Stop stiolto Follow-up in 6 months  Marshell Garfinkel MD [facility] 09/04/2022, 2:47 PM  CC: Janith Lima, MD

## 2022-10-16 DIAGNOSIS — Z23 Encounter for immunization: Secondary | ICD-10-CM | POA: Diagnosis not present

## 2022-10-25 ENCOUNTER — Encounter: Payer: Self-pay | Admitting: Internal Medicine

## 2022-10-30 ENCOUNTER — Other Ambulatory Visit: Payer: Self-pay | Admitting: Internal Medicine

## 2022-10-30 DIAGNOSIS — M5136 Other intervertebral disc degeneration, lumbar region: Secondary | ICD-10-CM | POA: Diagnosis not present

## 2022-10-30 DIAGNOSIS — M9904 Segmental and somatic dysfunction of sacral region: Secondary | ICD-10-CM | POA: Diagnosis not present

## 2022-10-30 DIAGNOSIS — M9903 Segmental and somatic dysfunction of lumbar region: Secondary | ICD-10-CM | POA: Diagnosis not present

## 2022-10-30 DIAGNOSIS — M9905 Segmental and somatic dysfunction of pelvic region: Secondary | ICD-10-CM | POA: Diagnosis not present

## 2022-10-30 DIAGNOSIS — M1A39X0 Chronic gout due to renal impairment, multiple sites, without tophus (tophi): Secondary | ICD-10-CM

## 2022-10-30 DIAGNOSIS — I10 Essential (primary) hypertension: Secondary | ICD-10-CM

## 2022-12-11 DIAGNOSIS — I129 Hypertensive chronic kidney disease with stage 1 through stage 4 chronic kidney disease, or unspecified chronic kidney disease: Secondary | ICD-10-CM | POA: Diagnosis not present

## 2022-12-11 DIAGNOSIS — M109 Gout, unspecified: Secondary | ICD-10-CM | POA: Diagnosis not present

## 2022-12-11 DIAGNOSIS — D509 Iron deficiency anemia, unspecified: Secondary | ICD-10-CM | POA: Diagnosis not present

## 2022-12-11 DIAGNOSIS — N183 Chronic kidney disease, stage 3 unspecified: Secondary | ICD-10-CM | POA: Diagnosis not present

## 2022-12-11 DIAGNOSIS — I480 Paroxysmal atrial fibrillation: Secondary | ICD-10-CM | POA: Diagnosis not present

## 2022-12-11 LAB — BASIC METABOLIC PANEL
BUN: 19 (ref 4–21)
CO2: 28 — AB (ref 13–22)
Chloride: 103 (ref 99–108)
Creatinine: 1.5 — AB (ref 0.6–1.3)
Glucose: 74
Potassium: 4.2 mEq/L (ref 3.5–5.1)
Sodium: 140 (ref 137–147)

## 2022-12-11 LAB — IRON,TIBC AND FERRITIN PANEL
%SAT: 41
Ferritin: 30
Iron: 155
TIBC: 379

## 2022-12-11 LAB — COMPREHENSIVE METABOLIC PANEL: Calcium: 9.3 (ref 8.7–10.7)

## 2022-12-22 ENCOUNTER — Other Ambulatory Visit: Payer: Medicare Other

## 2022-12-22 NOTE — Progress Notes (Unsigned)
   12/22/2022  Patient ID: Richard Blake, male   DOB: [birth year], 77 y.o.   MRN: [id number] Subjective/Objective: Patient appearing on report for Richard Blake - Hypertension Control report due to last documented ambulatory blood pressure of 140/60 on. Next appointment with PCP is 12/27/22.   Outreached patient to discuss hypertension control and medication management. Current home blood pressures relatively controlled, but identified adherence concerns with medications  HTN Current antihypertensives: Losartan '100mg'$  daily, metoprolol '25mg'$  BID, and torsemide '20mg'$  daily Patient has an automated upper arm home BP machine. Current blood pressure readings: 130/84, 126/92, 132/87, 119/77, 132/73, and 127/78 Patient denies side effects related to current regimen   Medication Management and Adherence Patient endorses taking "key medications" (Xarelto and BP) daily but states pill burden is too high.  Reviewed medication list with notes regarding how patient is actually taking medications.  Specifically, he is not taking vitamins/supplements regularly.  Inquiring about an alternative to [facility], as it is 2 caps per day and rather costly.  He also states he is out of finasteride and needs a refill to be sent to pharmacy.  Assessment/Plan: HTN - Currently uncontrolled per last clinic value, but home numbers indicate better control - - Reviewed goal blood pressure <130/80 - Reviewed appropriate administration of medication regimen - Recommend patient continue to monitor daily.  If values consistently >130/80, could consider increase in metoprolol to '50mg'$  BID if HR can handle.  Would be hesitant to add another medication based on adherence.  Adherence -Went through medication list and counseled on appropriate daily dosing of each; will also send this in a MyChart message to the patient -Discussed importance of adherence to medication regimen -Suggested at least taking vitamins and supplements three  times a week -Requesting Dr. Ronnald Ramp to place refill for finasteride '5mg'$  daily -Recommend discussing alternatives to F[employer] at upcoming appointment: patient previously on alendronate but stopped due to concern with long-term effects; also had injection site reaction to Prolia.  Could revisit alendronate '70mg'$  weekly, or calcium/vitamin D supplementation may be sufficient at this time.  Darlina Guys, PharmD, DPLA

## 2022-12-27 ENCOUNTER — Encounter: Payer: Self-pay | Admitting: Internal Medicine

## 2022-12-27 ENCOUNTER — Ambulatory Visit (INDEPENDENT_AMBULATORY_CARE_PROVIDER_SITE_OTHER): Payer: Medicare Other | Admitting: Internal Medicine

## 2022-12-27 VITALS — BP 138/80 | HR 61 | Temp 98.2°F | Resp 16 | Ht 65.0 in | Wt 179.0 lb

## 2022-12-27 DIAGNOSIS — I4819 Other persistent atrial fibrillation: Secondary | ICD-10-CM

## 2022-12-27 DIAGNOSIS — E559 Vitamin D deficiency, unspecified: Secondary | ICD-10-CM

## 2022-12-27 DIAGNOSIS — N138 Other obstructive and reflux uropathy: Secondary | ICD-10-CM | POA: Diagnosis not present

## 2022-12-27 DIAGNOSIS — Z23 Encounter for immunization: Secondary | ICD-10-CM | POA: Diagnosis not present

## 2022-12-27 DIAGNOSIS — I10 Essential (primary) hypertension: Secondary | ICD-10-CM

## 2022-12-27 DIAGNOSIS — M810 Age-related osteoporosis without current pathological fracture: Secondary | ICD-10-CM | POA: Diagnosis not present

## 2022-12-27 DIAGNOSIS — N183 Chronic kidney disease, stage 3 unspecified: Secondary | ICD-10-CM | POA: Diagnosis not present

## 2022-12-27 DIAGNOSIS — E785 Hyperlipidemia, unspecified: Secondary | ICD-10-CM | POA: Diagnosis not present

## 2022-12-27 DIAGNOSIS — N401 Enlarged prostate with lower urinary tract symptoms: Secondary | ICD-10-CM | POA: Diagnosis not present

## 2022-12-27 DIAGNOSIS — D696 Thrombocytopenia, unspecified: Secondary | ICD-10-CM

## 2022-12-27 DIAGNOSIS — M1A39X0 Chronic gout due to renal impairment, multiple sites, without tophus (tophi): Secondary | ICD-10-CM

## 2022-12-27 LAB — CBC WITH DIFFERENTIAL/PLATELET
Basophils Absolute: 0 10*3/uL (ref 0.0–0.1)
Basophils Relative: 1 % (ref 0.0–3.0)
Eosinophils Absolute: 0.2 10*3/uL (ref 0.0–0.7)
Eosinophils Relative: 5.8 % — ABNORMAL HIGH (ref 0.0–5.0)
HCT: 43.5 % (ref 39.0–52.0)
Hemoglobin: 14.2 g/dL (ref 13.0–17.0)
Lymphocytes Relative: 20.6 % (ref 12.0–46.0)
Lymphs Abs: 0.9 10*3/uL (ref 0.7–4.0)
MCHC: 32.7 g/dL (ref 30.0–36.0)
MCV: 88.1 fl (ref 78.0–100.0)
Monocytes Absolute: 0.5 10*3/uL (ref 0.1–1.0)
Monocytes Relative: 12.3 % — ABNORMAL HIGH (ref 3.0–12.0)
Neutro Abs: 2.6 10*3/uL (ref 1.4–7.7)
Neutrophils Relative %: 60.3 % (ref 43.0–77.0)
Platelets: 183 10*3/uL (ref 150.0–400.0)
RBC: 4.93 Mil/uL (ref 4.22–5.81)
RDW: 15.3 % (ref 11.5–15.5)
WBC: 4.3 10*3/uL (ref 4.0–10.5)

## 2022-12-27 LAB — LIPID PANEL
Cholesterol: 185 mg/dL (ref 0–200)
HDL: 52.8 mg/dL (ref >39.00)
LDL Cholesterol: 109 mg/dL — ABNORMAL HIGH (ref 0–99)
NonHDL: 132.64
Total CHOL/HDL Ratio: 4
Triglycerides: 116 mg/dL (ref 0.0–149.0)
VLDL: 23.2 mg/dL (ref 0.0–40.0)

## 2022-12-27 LAB — PSA: PSA: 4 ng/mL (ref 0.10–4.00)

## 2022-12-27 LAB — VITAMIN D 25 HYDROXY (VIT D DEFICIENCY, FRACTURES): VITD: 9.76 ng/mL — ABNORMAL LOW (ref 30.00–100.00)

## 2022-12-27 LAB — TSH: TSH: 1.3 u[IU]/mL (ref 0.35–5.50)

## 2022-12-27 MED ORDER — ROSUVASTATIN CALCIUM 10 MG PO TABS: 10.0000 mg | ORAL_TABLET | Freq: Every day | ORAL | 1 refills | Status: AC

## 2022-12-27 MED ORDER — RIVAROXABAN 15 MG PO TABS: 15.0000 mg | ORAL_TABLET | Freq: Every day | ORAL | 1 refills | Status: AC

## 2022-12-27 MED ORDER — CHOLECALCIFEROL 50 MCG (2000 UT) PO TABS: 1.0000 | ORAL_TABLET | Freq: Every day | ORAL | 1 refills | Status: AC

## 2022-12-27 MED ORDER — FINASTERIDE 5 MG PO TABS: 5.0000 mg | ORAL_TABLET | Freq: Every day | ORAL | 1 refills | Status: AC

## 2022-12-27 NOTE — Progress Notes (Signed)
Subjective:  Patient ID: Richard Blake, male    DOB: [birth year]  Age: 77 y.o. MRN: [id number]  CC: Hyperlipidemia, COPD, and Atrial Fibrillation   HPI Richard Blake presents for f/up ----   He complains of intermittent palpitations.  It looks like he may not be taking the DOAC.  He has a nonproductive cough but denies chest pain, hemoptysis, fever, chills, or night sweats.  Outpatient Medications Prior to Visit  Medication Sig Dispense Refill   acetaminophen (TYLENOL) 500 MG tablet Take 500-1,000 mg by mouth every 6 (six) hours as needed (for pain from muscle spasms).     allopurinol (ZYLOPRIM) 100 MG tablet TAKE 2 TABLETS(200 MG) BY MOUTH DAILY 180 tablet 0   Dietary Management Product (FOSTEUM PLUS) CAPS Take 1 capsule by mouth 2 (two) times daily. 60 capsule 11   ferrous sulfate 325 (65 FE) MG tablet Take 1 tablet (325 mg total) by mouth 3 (three) times daily with meals. 90 tablet 2   losartan (COZAAR) 100 MG tablet TAKE 1 TABLET BY MOUTH EVERY DAY 90 tablet 2   metoprolol tartrate (LOPRESSOR) 25 MG tablet TAKE 1 TABLET(25 MG) BY MOUTH TWICE DAILY 180 tablet 1   pantoprazole (PROTONIX) 40 MG tablet TAKE 1 TABLET(40 MG) BY MOUTH DAILY 90 tablet 1   STIOLTO RESPIMAT 2.5-2.5 MCG/ACT AERS INHALE 2 PUFFS INTO THE LUNGS DAILY 12 g 1   SYSTANE ULTRA 0.4-0.3 % SOLN SMARTSIG:1 Drop(s) In Eye(s) As Needed     torsemide (DEMADEX) 20 MG tablet TAKE 1 TABLET(20 MG) BY MOUTH DAILY 90 tablet 0   vitamin B-12 (CYANOCOBALAMIN) 250 MCG tablet Take 250 mcg by mouth daily.     Cholecalciferol 50 MCG (2000 UT) TABS Take 1 tablet (2,000 Units total) by mouth daily. 90 tablet 1   finasteride (PROSCAR) 5 MG tablet TAKE 1 TABLET(5 MG) BY MOUTH DAILY 90 tablet 1   Rivaroxaban (XARELTO) 15 MG TABS tablet Take 1 tablet (15 mg total) by mouth daily with supper. 90 tablet 1   rosuvastatin (CRESTOR) 10 MG tablet Take 1 tablet (10 mg total) by mouth daily. 90 tablet 1   No facility-administered medications  prior to visit.    ROS Review of Systems  Constitutional:  Negative for chills, diaphoresis, fatigue and fever.  HENT: Negative.    Eyes: Negative.   Respiratory:  Positive for cough. Negative for chest tightness, shortness of breath and wheezing.   Cardiovascular:  Positive for palpitations. Negative for chest pain and leg swelling.  Gastrointestinal:  Negative for abdominal pain, constipation, diarrhea, nausea and vomiting.  Genitourinary: Negative.  Negative for difficulty urinating.  Musculoskeletal:  Negative for arthralgias, joint swelling and myalgias.  Skin: Negative.   Neurological: Negative.  Negative for dizziness and weakness.  Hematological:  Negative for adenopathy. Does not bruise/bleed easily.  Psychiatric/Behavioral: Negative.      Objective:  BP 138/80 (BP Location: Left Arm, Patient Position: Sitting, Cuff Size: Large)   Pulse 61   Temp 98.2 F (36.8 C) (Oral)   Resp 16   Ht 5' 5"$  (1.651 m)   Wt 179 lb (81.2 kg)   SpO2 96%   BMI 29.79 kg/m   BP Readings from Last 3 Encounters:  12/27/22 138/80  09/04/22 (!) 140/60  07/10/22 (!) 142/66    Wt Readings from Last 3 Encounters:  12/27/22 179 lb (81.2 kg)  09/04/22 177 lb 6.4 oz (80.5 kg)  07/10/22 176 lb 9.6 oz (80.1 kg)    Physical Exam Vitals  reviewed.  HENT:     Nose: Nose normal.     Mouth/Throat:     Mouth: Mucous membranes are moist.  Eyes:     General: No scleral icterus.    Conjunctiva/sclera: Conjunctivae normal.  Cardiovascular:     Rate and Rhythm: Normal rate. Rhythm irregularly irregular.     Heart sounds: No murmur heard. Pulmonary:     Effort: Pulmonary effort is normal.     Breath sounds: No stridor. No wheezing, rhonchi or rales.  Abdominal:     General: Abdomen is flat.     Palpations: There is no mass.     Tenderness: There is no abdominal tenderness. There is no guarding.     Hernia: No hernia is present.  Musculoskeletal:        General: Normal range of motion.      Cervical back: Neck supple.     Right lower leg: No edema.     Left lower leg: No edema.  Lymphadenopathy:     Cervical: No cervical adenopathy.  Skin:    General: Skin is warm and dry.  Neurological:     General: No focal deficit present.     Mental Status: He is alert. Mental status is at baseline.  Psychiatric:        Mood and Affect: Mood normal.        Behavior: Behavior normal.     Lab Results  Component Value Date   WBC 4.3 12/27/2022   HGB 14.2 12/27/2022   HCT 43.5 12/27/2022   PLT 183.0 12/27/2022   GLUCOSE 91 05/09/2022   CHOL 185 12/27/2022   TRIG 116.0 12/27/2022   HDL 52.80 12/27/2022   LDLDIRECT 186.5 10/23/2012   LDLCALC 109 (H) 12/27/2022   ALT 15 04/22/2018   AST 15 04/22/2018   NA 140 12/11/2022   K 4.2 12/11/2022   CL 103 12/11/2022   CREATININE 1.5 (A) 12/11/2022   BUN 19 12/11/2022   CO2 28 (A) 12/11/2022   TSH 1.30 12/27/2022   PSA 4.00 12/27/2022   INR 1.1 12/14/2016    CT CHEST HIGH RESOLUTION  Result Date: 08/30/2022 CLINICAL DATA:  Interstitial lung disease EXAM: CT CHEST WITHOUT CONTRAST TECHNIQUE: Multidetector CT imaging of the chest was performed following the standard protocol without intravenous contrast. High resolution imaging of the lungs, as well as inspiratory and expiratory imaging, was performed. RADIATION DOSE REDUCTION: This exam was performed according to the departmental dose-optimization program which includes automated exposure control, adjustment of the mA and/or kV according to patient size and/or use of iterative reconstruction technique. COMPARISON:  None Available. FINDINGS: Cardiovascular: Cardiomegaly. No pericardial effusion. Normal caliber thoracic aorta with mild calcified plaque. Dilated main pulmonary artery, measuring up to 4.0 cm. Moderate coronary artery calcifications of the LAD. Mediastinum/Nodes: Large hiatal hernia containing the stomach and a portion of the transverse colon. Fluid attenuation paraesophageal  structure seen on series 2, image 137, likely a lymphocele. No pathologically enlarged lymph nodes seen in the chest. Lungs/Pleura: Central airways are patent. Biapical pleural-parenchymal scarring. Moderate air trapping. Upper lobe and peribronchovascular predominant reticular opacities with mild associated ground-glass opacities and honeycomb change. No consolidation, pleural effusion or pneumothorax. Upper Abdomen: Partially visualized bilateral low-attenuation renal lesions which are likely simple cysts, no specific follow-up imaging is recommended. No acute abnormality. Musculoskeletal: No chest wall mass or suspicious bone lesions identified. IMPRESSION: 1. 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 Findings are  suggestive of an alternative diagnosis (not UIP) per consensus guidelines: Diagnosis of Idiopathic Pulmonary Fibrosis: An Official ATS/ERS/JRS/ALAT Clinical Practice Guideline. [place], Iss 5, [phone], Jul 14 2017. 2. Bilateral air trapping, findings can be seen in the setting of sarcoidosis. 3. Large hiatal hernia containing the stomach and a portion of the transverse colon. 4. Aortic Atherosclerosis (ICD10-I70.0). Electronically Signed   By: Yetta Glassman M.D.   On: 08/30/2022 10:36    Assessment & Plan:   Richard Blake was seen today for hyperlipidemia, copd and atrial fibrillation.  Diagnoses and all orders for this visit:  Essential hypertension- His blood pressure is adequately well-controlled. -     TSH; Future -     CBC with Differential/Platelet; Future -     CBC with Differential/Platelet -     TSH  Persistent atrial fibrillation- He has good rate control.  Will restart the DOAC. -     Rivaroxaban (XARELTO) 15 MG TABS tablet; Take 1 tablet (15 mg total) by mouth daily with supper.  Age-related osteoporosis without current pathological fracture -      VITAMIN D 25 Hydroxy (Vit-D Deficiency, Fractures); Future -     DG Bone Density; Future -     VITAMIN D 25 Hydroxy (Vit-D Deficiency, Fractures)  BPH with obstruction/lower urinary tract symptoms- His PSA is not rising. -     PSA; Future -     PSA -     finasteride (PROSCAR) 5 MG tablet; Take 1 tablet (5 mg total) by mouth daily.  Hyperlipidemia with target LDL less than 130 -     Lipid panel; Future -     TSH; Future -     rosuvastatin (CRESTOR) 10 MG tablet; Take 1 tablet (10 mg total) by mouth daily. -     TSH -     Lipid panel  Chronic gout due to renal impairment of multiple sites without tophus  Acquired thrombocytopenia ([place])- Will restart the DOAC. -     Rivaroxaban (XARELTO) 15 MG TABS tablet; Take 1 tablet (15 mg total) by mouth daily with supper.  Vitamin D deficiency -     Cholecalciferol 50 MCG (2000 UT) TABS; Take 1 tablet (2,000 Units total) by mouth daily.  Other orders -     Pneumococcal polysaccharide vaccine 23-valent greater than or equal to 2yo subcutaneous/IM   I have changed Richard Blake. Harter's finasteride. I am also having him maintain his cyanocobalamin, acetaminophen, Fosteum Plus, ferrous sulfate, Systane Ultra, pantoprazole, losartan, Stiolto Respimat, metoprolol tartrate, allopurinol, torsemide, rosuvastatin, Rivaroxaban, and Cholecalciferol.  Meds ordered this encounter  Medications   rosuvastatin (CRESTOR) 10 MG tablet    Sig: Take 1 tablet (10 mg total) by mouth daily.    Dispense:  90 tablet    Refill:  1   Rivaroxaban (XARELTO) 15 MG TABS tablet    Sig: Take 1 tablet (15 mg total) by mouth daily with supper.    Dispense:  90 tablet    Refill:  1   finasteride (PROSCAR) 5 MG tablet    Sig: Take 1 tablet (5 mg total) by mouth daily.    Dispense:  90 tablet    Refill:  1   Cholecalciferol 50 MCG (2000 UT) TABS    Sig: Take 1 tablet (2,000 Units total) by mouth daily.    Dispense:  90 tablet    Refill:  1     Follow-up: Return in  about 6 months (around 06/27/2023).  Scarlette Calico, MD

## 2022-12-27 NOTE — Patient Instructions (Signed)

## 2023-01-24 DIAGNOSIS — H43813 Vitreous degeneration, bilateral: Secondary | ICD-10-CM | POA: Diagnosis not present

## 2023-01-26 ENCOUNTER — Other Ambulatory Visit: Payer: Self-pay | Admitting: Internal Medicine

## 2023-01-26 DIAGNOSIS — M1A39X0 Chronic gout due to renal impairment, multiple sites, without tophus (tophi): Secondary | ICD-10-CM

## 2023-02-26 ENCOUNTER — Other Ambulatory Visit: Payer: Self-pay | Admitting: Internal Medicine

## 2023-02-26 DIAGNOSIS — I10 Essential (primary) hypertension: Secondary | ICD-10-CM

## 2023-02-27 ENCOUNTER — Other Ambulatory Visit: Payer: Self-pay | Admitting: Internal Medicine

## 2023-02-27 DIAGNOSIS — I10 Essential (primary) hypertension: Secondary | ICD-10-CM

## 2023-02-28 DIAGNOSIS — M9905 Segmental and somatic dysfunction of pelvic region: Secondary | ICD-10-CM | POA: Diagnosis not present

## 2023-02-28 DIAGNOSIS — M5136 Other intervertebral disc degeneration, lumbar region: Secondary | ICD-10-CM | POA: Diagnosis not present

## 2023-02-28 DIAGNOSIS — M9904 Segmental and somatic dysfunction of sacral region: Secondary | ICD-10-CM | POA: Diagnosis not present

## 2023-02-28 DIAGNOSIS — M9903 Segmental and somatic dysfunction of lumbar region: Secondary | ICD-10-CM | POA: Diagnosis not present

## 2023-03-08 ENCOUNTER — Telehealth: Payer: Self-pay | Admitting: Internal Medicine

## 2023-03-08 NOTE — Telephone Encounter (Signed)
Called patient to schedule Medicare Annual Wellness Visit (AWV). Left message for patient to call back and schedule Medicare Annual Wellness Visit (AWV).  Last date of AWV: 02/21/2022  Please schedule an appointment at any time with [facility].  If any questions, please contact me at [phone] .  Thank you ,  Bernice Cicero Care Guide [facility] AWV TEAM Direct Dial: [phone]     

## 2023-03-19 ENCOUNTER — Telehealth: Payer: Self-pay

## 2023-03-19 ENCOUNTER — Ambulatory Visit (INDEPENDENT_AMBULATORY_CARE_PROVIDER_SITE_OTHER): Payer: Medicare Other

## 2023-03-19 VITALS — BP 126/87 | HR 56 | Temp 97.4°F | Ht 65.0 in | Wt 177.0 lb

## 2023-03-19 DIAGNOSIS — Z0000 Encounter for general adult medical examination without abnormal findings: Secondary | ICD-10-CM | POA: Diagnosis not present

## 2023-03-19 NOTE — Telephone Encounter (Signed)
Patient never received call to schedule bone density.

## 2023-03-19 NOTE — Telephone Encounter (Signed)
DEXA scan was ordered 3 months ago

## 2023-03-19 NOTE — Progress Notes (Addendum)
I connected with  Sandi Raveling on 03/19/23 by a audio enabled telemedicine application and verified that I am speaking with the correct person using two identifiers.  Patient Location: Home  Provider Location: Office/Clinic  I discussed the limitations of evaluation and management by telemedicine. The patient expressed understanding and agreed to proceed.  Patient Medicare AWV questionnaire was completed by the patient on 03/15/2023; I have confirmed that all information answered by patient is correct and no changes since this date.    Subjective:   Richard Blake is a 77 y.o. male who presents for Medicare Annual/Subsequent preventive examination.  Review of Systems     Cardiac Risk Factors include: advanced age (>10men, >2 women);dyslipidemia;family history of premature cardiovascular disease;hypertension;male gender     Objective:    Today's Vitals   03/19/23 1511 03/19/23 1513  BP: 126/87   Pulse: (!) 56   Temp: (!) 97.4 F (36.3 C)   Weight: 177 lb (80.3 kg)   Height: 5\' 5"  (1.651 m)   PainSc: 2  2   PainLoc: Generalized    Body mass index is 29.45 kg/m.     03/19/2023    3:17 PM 02/21/2022    2:49 PM 02/09/2021    2:12 PM 09/23/2019   11:23 AM 09/17/2018   11:52 AM 09/13/2017    2:35 PM 01/29/2017    2:03 PM  Advanced Directives  Does Patient Have a Medical Advance Directive? No No No No No No No  Would patient like information on creating a medical advance directive? No - Patient declined No - Patient declined No - Patient declined No - Patient declined Yes (ED - Information included in AVS) Yes (ED - Information included in AVS) No - Patient declined    Current Medications (verified) Outpatient Encounter Medications as of 03/19/2023  Medication Sig   acetaminophen (TYLENOL) 500 MG tablet Take 500-1,000 mg by mouth every 6 (six) hours as needed (for pain from muscle spasms).   allopurinol (ZYLOPRIM) 100 MG tablet TAKE 2 TABLETS(200 MG) BY MOUTH DAILY    Cholecalciferol 50 MCG (2000 UT) TABS Take 1 tablet (2,000 Units total) by mouth daily.   Dietary Management Product (FOSTEUM PLUS) CAPS Take 1 capsule by mouth 2 (two) times daily.   ferrous sulfate 325 (65 FE) MG tablet Take 1 tablet (325 mg total) by mouth 3 (three) times daily with meals.   finasteride (PROSCAR) 5 MG tablet Take 1 tablet (5 mg total) by mouth daily.   losartan (COZAAR) 100 MG tablet Take 1 tablet (100 mg total) by mouth daily. Pt needs appt for any future refills. Please call office to schedule appt.   metoprolol tartrate (LOPRESSOR) 25 MG tablet TAKE 1 TABLET(25 MG) BY MOUTH TWICE DAILY   pantoprazole (PROTONIX) 40 MG tablet TAKE 1 TABLET(40 MG) BY MOUTH DAILY   Rivaroxaban (XARELTO) 15 MG TABS tablet Take 1 tablet (15 mg total) by mouth daily with supper.   rosuvastatin (CRESTOR) 10 MG tablet Take 1 tablet (10 mg total) by mouth daily.   STIOLTO RESPIMAT 2.5-2.5 MCG/ACT AERS INHALE 2 PUFFS INTO THE LUNGS DAILY   SYSTANE ULTRA 0.4-0.3 % SOLN SMARTSIG:1 Drop(s) In Eye(s) As Needed   torsemide (DEMADEX) 20 MG tablet TAKE 1 TABLET(20 MG) BY MOUTH DAILY   vitamin B-12 (CYANOCOBALAMIN) 250 MCG tablet Take 250 mcg by mouth daily.   No facility-administered encounter medications on file as of 03/19/2023.    Allergies (verified) Amlodipine, Welchol [colesevelam hcl], Other, and Prolia [denosumab]  History: Past Medical History:  Diagnosis Date   A-fib (HCC) 11/2016   Tx with po meds at this time   Anemia    Microcytic with Fe Sat 4% 10/31/10   Atypical chest pain    Carpal tunnel syndrome, left    Cataract    removed bilaterally   Chronic rhinitis    Cough    Diverticulosis    Dyspnea    " WITH MY AFIB "   GERD (gastroesophageal reflux disease)    Hiatal hernia    History of recent blood transfusion 06/05/2018   HLD (hyperlipidemia)    HTN (hypertension)    Interstitial lung disease (HCC)    Kidney stones    Osteoarthritis    left hand   Osteoporosis 2014    Sarcoidosis 8/99   Transbronchial biopsy, taking daily prednisone since  November 08 on and off since 1999 w/cough every time he relapses off it for more than several weeks   Undescended right testicle    "shrivled" in childhood from 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   Past Surgical History:  Procedure Laterality Date   CARDIOVERSION N/A 12/21/2016   Procedure: CARDIOVERSION;  Surgeon: Marinus Maw, MD;  Location: [facility] ENDOSCOPY;  Service: Cardiovascular;  Laterality: N/A;   CARDIOVERSION N/A 01/31/2017   Procedure: CARDIOVERSION;  Surgeon: Chrystie Nose, MD;  Location: [facility] ENDOSCOPY;  Service: Cardiovascular;  Laterality: N/A;   CATARACT EXTRACTION Left 10/14/2013   CATARACT EXTRACTION Right 11/24/2013   CERVICAL FUSION     pt states he did not have a cervical fusion, just a biopsy    COLONOSCOPY  01/12/2006   tics only    ENDOVENOUS ABLATION SAPHENOUS VEIN W/ LASER Left 10/23/2018   endovenous laser ablation left greater saphenous vein by Fabienne Bruns MD    ENDOVENOUS ABLATION SAPHENOUS VEIN W/ LASER Right 12/04/2018   endovenous laser ablation right greater saphenous vein by Fabienne Bruns MD    INGUINAL HERNIA REPAIR     right   LIPOMA EXCISION Left 09/16/2004   forearm   spinal cord biopsyOther]  1996   sarcoid   Family History  Problem Relation Age of Onset   Asthma Mother    Diabetes Mother    Arthritis Paternal Grandmother    Heart disease Paternal Grandmother    Hypertension Father        Fathers family most   Prostate cancer Father    Kidney disease Father    Colon polyps Sister    Multiple myeloma Sister    Heart disease Paternal Uncle    Kidney disease Sister    Sarcoidosis Neg Hx    Other Neg Hx        osteoporosis   Colon cancer Neg Hx    Esophageal cancer Neg Hx    Rectal cancer Neg Hx    Stomach cancer Neg Hx    Social History   Socioeconomic History   Marital status: Single    Spouse name: Not on file   Number of children: 0   Years of education: Not on file    Highest education level: Bachelor's degree (e.g., BA, AB, BS)  Occupational History   Occupation: Retired    Associate Professor: Advice worker  Tobacco Use   Smoking status: Never   Smokeless tobacco: Never  Vaping Use   Vaping Use: Never used  Substance and Sexual Activity   Alcohol use: No    Alcohol/week: 0.0 standard drinks of alcohol   Drug use: No   Sexual activity: Not  Currently  Other Topics Concern   Not on file  Social History Narrative   Lives alone   Regular Exercise -  NO   Social Determinants of Health   Financial Resource Strain: Medium Risk (03/19/2023)   Overall Financial Resource Strain (CARDIA)    Difficulty of Paying Living Expenses: Somewhat hard  Food Insecurity: No Food Insecurity (03/19/2023)   Hunger Vital Sign    Worried About Running Out of Food in the Last Year: Never true    Ran Out of Food in the Last Year: Never true  Transportation Needs: No Transportation Needs (03/19/2023)   PRAPARE - Administrator, Civil Service (Medical): No    Lack of Transportation (Non-Medical): No  Physical Activity: Sufficiently Active (03/19/2023)   Exercise Vital Sign    Days of Exercise per Week: 5 days    Minutes of Exercise per Session: 30 min  Stress: No Stress Concern Present (03/19/2023)   [employer] of Occupational Health - Occupational Stress Questionnaire    Feeling of Stress : Only a little  Social Connections: Unknown (03/19/2023)   Social Connection and Isolation Panel [NHANES]    Frequency of Communication with Friends and Family: Twice a week    Frequency of Social Gatherings with Friends and Family: Once a week    Attends Religious Services: Patient declined    Database administrator or Organizations: No    Attends Engineer, structural: Patient declined    Marital Status: Never married    Tobacco Counseling Counseling given: Not Answered   Clinical Intake:  Pre-visit preparation completed: Yes  Pain : No/denies pain Pain  Score: 2      BMI - recorded: 29.45 Nutritional Status: BMI 25 -29 Overweight Nutritional Risks: None Diabetes: No  How often do you need to have someone help you when you read instructions, pamphlets, or other written materials from your doctor or pharmacy?: 2 - Rarely What is the last grade level you completed in school?: HSG  Diabetic? No  Interpreter Needed?: No  Information entered by :: Kelechi Astarita N. Cybil Senegal, LPN.   Activities of Daily Living    03/19/2023    3:10 PM 03/15/2023    2:18 PM  In your present state of health, do you have any difficulty performing the following activities:  Hearing? 0 0  Vision? 0 0  Difficulty concentrating or making decisions? 0 0  Walking or climbing stairs? 0 0  Dressing or bathing? 0 0  Doing errands, shopping? 0 0  Preparing Food and eating ? N N  Using the Toilet? N N  In the past six months, have you accidently leaked urine? N N  Do you have problems with loss of bowel control? N N  Managing your Medications? N Y  Managing your Finances? N N  Housekeeping or managing your Housekeeping? N N    Patient Care Team: [name], MD as PCP - General Ladona Ridgel Doylene Canning, MD as PCP - Cardiology (Cardiology) Marinus Maw, MD as PCP - Electrophysiology (Cardiology) Love, Genene Churn, MD (Neurology) Nyoka Cowden, MD as Attending Physician (Pulmonary Disease) Marinus Maw, MD as Consulting Physician (Cardiology) Annie Sable, MD as Consulting Physician (Nephrology) [place], Heather, OD as Consulting Physician (Optometry)  Indicate any recent Medical Services you may have received from other than [facility] providers in the past year (date may be approximate).     Assessment:   This is a routine wellness examination for Gratis.  Hearing/Vision screen  Hearing Screening - Comments:: Denies hearing difficulties   Vision Screening - Comments:: Wears rx glasses - up to date with routine eye exams with Heather Bur[place]i, OD.   Dietary  issues and exercise activities discussed: Current Exercise Habits: Home exercise routine, Type of exercise: walking, Time (Minutes): 30, Frequency (Times/Week): 5, Weekly Exercise (Minutes/Week): 150, Intensity: Moderate, Exercise limited by: cardiac condition(s);respiratory conditions(s)   Goals Addressed             This Visit's Progress    Client understands the importance of follow-up with providers by attending scheduled visits        Depression Screen    03/19/2023    3:15 PM 02/21/2022    2:53 PM 02/09/2021    2:11 PM 01/24/2021    1:33 PM 09/23/2019   11:29 AM 08/19/2019    1:42 PM 09/17/2018   11:52 AM  PHQ 2/9 Scores  PHQ - 2 Score 0 0 0 0 0 0 0  PHQ- 9 Score 0          Fall Risk    03/19/2023    3:18 PM 03/15/2023    2:18 PM 03/14/2023    5:27 AM 02/21/2022    2:51 PM 02/09/2021    2:12 PM  Fall Risk   Falls in the past year? 1 1 1  0 0  Number falls in past yr: 0 0 0 0 0  Injury with Fall? 0 0 0 0 0  Risk for fall due to : No Fall Risks   No Fall Risks No Fall Risks  Follow up Falls prevention discussed   Falls evaluation completed Falls evaluation completed    FALL RISK PREVENTION PERTAINING TO THE HOME:  Any stairs in or around the home? No  If so, are there any without handrails? No  Home free of loose throw rugs in walkways, pet beds, electrical cords, etc? Yes  Adequate lighting in your home to reduce risk of falls? Yes   ASSISTIVE DEVICES UTILIZED TO PREVENT FALLS:  Life alert? No  Use of a cane, walker or w/c? No  Grab bars in the bathroom? No  Shower chair or bench in shower? No  Elevated toilet seat or a handicapped toilet? No   TIMED UP AND GO:  Was the test performed? No . Telephonic Visit   Cognitive Function:    09/13/2017    2:53 PM  MMSE - Mini Mental State Exam  Orientation to time 5  Orientation to Place 5  Registration 3  Attention/ Calculation 5  Recall 2  Language- name 2 objects 2  Language- repeat 1  Language- follow 3 step  command 3  Language- read & follow direction 1  Write a sentence 1  Copy design 1  Total score 29        03/19/2023    3:08 PM 02/21/2022    3:03 PM  6CIT Screen  What Year? 0 points 0 points  What month? 0 points 0 points  What time? 0 points 0 points  Count back from 20 0 points 0 points  Months in reverse 0 points 0 points  Repeat phrase 0 points 0 points  Total Score 0 points 0 points    Immunizations Immunization History  Administered Date(s) Administered   Fluad Quad(high Dose 65+) 08/19/2019, 08/11/2020, 09/26/2021   Influenza Whole 10/31/2010, 09/14/2011, 10/16/2012   Influenza, High Dose Seasonal PF 07/20/2013, 08/28/2017, 10/03/2018   Influenza,inj,Quad PF,6+ Mos 01/12/2015, 11/02/2015   Influenza-Unspecified 08/22/2016, 08/18/2022  PFIZER(Purple Top)SARS-COV-2 Vaccination 01/03/2020, 01/27/2020, 08/28/2020, 10/16/2022   Pfizer Covid-19 Vaccine Bivalent Booster 59yrs & up 05/30/2021, 11/10/2021   Pneumococcal Conjugate-13 01/12/2015   Pneumococcal Polysaccharide-23 12/14/2000, 01/19/2016, 05/09/2022, 12/27/2022   Td 10/31/2010   Tdap 08/11/2020   Zoster Recombinat (Shingrix) 08/21/2019, 10/28/2019   Zoster, Live 04/20/2016    TDAP status: Up to date  Flu Vaccine status: Up to date  Pneumococcal vaccine status: Up to date  Covid-19 vaccine status: Completed vaccines  Qualifies for Shingles Vaccine? Yes   Zostavax completed No   Shingrix Completed?: Yes  Screening Tests Health Maintenance  Topic Date Due   COVID-19 Vaccine (7 - 2023-24 season) 12/11/2022   INFLUENZA VACCINE  06/14/2023   Medicare Annual Wellness (AWV)  03/18/2024   COLONOSCOPY (Pts 45-30yrs Insurance coverage will need to be confirmed)  07/13/2026   DTaP/Tdap/Td (3 - Td or Tdap) 08/11/2030   Pneumonia Vaccine 30+ Years old  Completed   Hepatitis C Screening  Completed   Zoster Vaccines- Shingrix  Completed   HPV VACCINES  Aged Out    Health Maintenance  Health Maintenance Due   Topic Date Due   COVID-19 Vaccine (7 - 2023-24 season) 12/11/2022    Colorectal cancer screening: Type of screening: Colonoscopy. Completed 07/13/2016. Repeat every 10 years  Lung Cancer Screening: (Low Dose CT Chest recommended if Age 42-80 years, 30 pack-year currently smoking OR have quit w/in 15years.) does not qualify.   Lung Cancer Screening Referral: No  Additional Screening:  Hepatitis C Screening: does qualify; Completed 02/19/2014  Vision Screening: Recommended annual ophthalmology exams for early detection of glaucoma and other disorders of the eye. Is the patient up to date with their annual eye exam?  Yes  Who is the provider or what is the name of the office in which the patient attends annual eye exams? Heather Burundi, OD. If pt is not established with a provider, would they like to be referred to a provider to establish care? No .   Dental Screening: Recommended annual dental exams for proper oral hygiene  Community Resource Referral / Chronic Care Management: CRR required this visit?  No   CCM required this visit?  No      Plan:     I have personally reviewed and noted the following in the patient's chart:   Medical and social history Use of alcohol, tobacco or illicit drugs  Current medications and supplements including opioid prescriptions. Patient is not currently taking opioid prescriptions. Functional ability and status Nutritional status Physical activity Advanced directives List of other physicians Hospitalizations, surgeries, and ER visits in previous 12 months Vitals Screenings to include cognitive, depression, and falls Referrals and appointments  In addition, I have reviewed and discussed with patient certain preventive protocols, quality metrics, and best practice recommendations. A written personalized care plan for preventive services as well as general preventive health recommendations were provided to patient.     Mickeal Needy,  LPN   4/0/9811   Nurse Notes:  Normal cognitive status assessed by direct observation via phone conversation by this Nurse Health Advisor. No abnormalities found.  Patient provided his own vital signs for this visit.

## 2023-03-19 NOTE — Patient Instructions (Signed)
Richard Blake , Thank you for taking time to come for your Medicare Wellness Visit. I appreciate your ongoing commitment to your health goals. Please review the following plan we discussed and let me know if I can assist you in the future.   These are the goals we discussed:  Goals      Client understands the importance of follow-up with providers by attending scheduled visits        This is a list of the screening recommended for you and due dates:  Health Maintenance  Topic Date Due   COVID-19 Vaccine (7 - 2023-24 season) 12/11/2022   Flu Shot  06/14/2023   Medicare Annual Wellness Visit  03/18/2024   Colon Cancer Screening  07/13/2026   DTaP/Tdap/Td vaccine (3 - Td or Tdap) 08/11/2030   Pneumonia Vaccine  Completed   Hepatitis C Screening: USPSTF Recommendation to screen - Ages 51-79 yo.  Completed   Zoster (Shingles) Vaccine  Completed   HPV Vaccine  Aged Out    Advanced directives: No  Conditions/risks identified: Yes  Next appointment: Follow up in one year for your annual wellness visit.   Preventive Care 28 Years and Older, Male  Preventive care refers to lifestyle choices and visits with your health care provider that can promote health and wellness. What does preventive care include? A yearly physical exam. This is also called an annual well check. Dental exams once or twice a year. Routine eye exams. Ask your health care provider how often you should have your eyes checked. Personal lifestyle choices, including: Daily care of your teeth and gums. Regular physical activity. Eating a healthy diet. Avoiding tobacco and drug use. Limiting alcohol use. Practicing safe sex. Taking low doses of aspirin every day. Taking vitamin and mineral supplements as recommended by your health care provider. What happens during an annual well check? The services and screenings done by your health care provider during your annual well check will depend on your age, overall health,  lifestyle risk factors, and family history of disease. Counseling  Your health care provider may ask you questions about your: Alcohol use. Tobacco use. Drug use. Emotional well-being. Home and relationship well-being. Sexual activity. Eating habits. History of falls. Memory and ability to understand (cognition). Work and work Astronomer. Screening  You may have the following tests or measurements: Height, weight, and BMI. Blood pressure. Lipid and cholesterol levels. These may be checked every 5 years, or more frequently if you are over 42 years old. Skin check. Lung cancer screening. You may have this screening every year starting at age 43 if you have a 30-pack-year history of smoking and currently smoke or have quit within the past 15 years. Fecal occult blood test (FOBT) of the stool. You may have this test every year starting at age 66. Flexible sigmoidoscopy or colonoscopy. You may have a sigmoidoscopy every 5 years or a colonoscopy every 10 years starting at age 52. Prostate cancer screening. Recommendations will vary depending on your family history and other risks. Hepatitis C blood test. Hepatitis B blood test. Sexually transmitted disease (STD) testing. Diabetes screening. This is done by checking your blood sugar (glucose) after you have not eaten for a while (fasting). You may have this done every 1-3 years. Abdominal aortic aneurysm (AAA) screening. You may need this if you are a current or former smoker. Osteoporosis. You may be screened starting at age 10 if you are at high risk. Talk with your health care provider about your test  results, treatment options, and if necessary, the need for more tests. Vaccines  Your health care provider may recommend certain vaccines, such as: Influenza vaccine. This is recommended every year. Tetanus, diphtheria, and acellular pertussis (Tdap, Td) vaccine. You may need a Td booster every 10 years. Zoster vaccine. You may need this  after age 85. Pneumococcal 13-valent conjugate (PCV13) vaccine. One dose is recommended after age 22. Pneumococcal polysaccharide (PPSV23) vaccine. One dose is recommended after age 66. Talk to your health care provider about which screenings and vaccines you need and how often you need them. This information is not intended to replace advice given to you by your health care provider. Make sure you discuss any questions you have with your health care provider. Document Released: 11/26/2015 Document Revised: 07/19/2016 Document Reviewed: 08/31/2015 Elsevier Interactive Patient Education  2017 [place] Prevention in the Home Falls can cause injuries. They can happen to people of all ages. There are many things you can do to make your home safe and to help prevent falls. What can I do on the outside of my home? Regularly fix the edges of walkways and driveways and fix any cracks. Remove anything that might make you trip as you walk through a door, such as a raised step or threshold. Trim any bushes or trees on the path to your home. Use bright outdoor lighting. Clear any walking paths of anything that might make someone trip, such as rocks or tools. Regularly check to see if handrails are loose or broken. Make sure that both sides of any steps have handrails. Any raised decks and porches should have guardrails on the edges. Have any leaves, snow, or ice cleared regularly. Use sand or salt on walking paths during winter. Clean up any spills in your garage right away. This includes oil or grease spills. What can I do in the bathroom? Use night lights. Install grab bars by the toilet and in the tub and shower. Do not use towel bars as grab bars. Use non-skid mats or decals in the tub or shower. If you need to sit down in the shower, use a plastic, non-slip stool. Keep the floor dry. Clean up any water that spills on the floor as soon as it happens. Remove soap buildup in the tub or  shower regularly. Attach bath mats securely with double-sided non-slip rug tape. Do not have throw rugs and other things on the floor that can make you trip. What can I do in the bedroom? Use night lights. Make sure that you have a light by your bed that is easy to reach. Do not use any sheets or blankets that are too big for your bed. They should not hang down onto the floor. Have a firm chair that has side arms. You can use this for support while you get dressed. Do not have throw rugs and other things on the floor that can make you trip. What can I do in the kitchen? Clean up any spills right away. Avoid walking on wet floors. Keep items that you use a lot in easy-to-reach places. If you need to reach something above you, use a strong step stool that has a grab bar. Keep electrical cords out of the way. Do not use floor polish or wax that makes floors slippery. If you must use wax, use non-skid floor wax. Do not have throw rugs and other things on the floor that can make you trip. What can I do with my stairs?  Do not leave any items on the stairs. Make sure that there are handrails on both sides of the stairs and use them. Fix handrails that are broken or loose. Make sure that handrails are as long as the stairways. Check any carpeting to make sure that it is firmly attached to the stairs. Fix any carpet that is loose or worn. Avoid having throw rugs at the top or bottom of the stairs. If you do have throw rugs, attach them to the floor with carpet tape. Make sure that you have a light switch at the top of the stairs and the bottom of the stairs. If you do not have them, ask someone to add them for you. What else can I do to help prevent falls? Wear shoes that: Do not have high heels. Have rubber bottoms. Are comfortable and fit you well. Are closed at the toe. Do not wear sandals. If you use a stepladder: Make sure that it is fully opened. Do not climb a closed stepladder. Make  sure that both sides of the stepladder are locked into place. Ask someone to hold it for you, if possible. Clearly mark and make sure that you can see: Any grab bars or handrails. First and last steps. Where the edge of each step is. Use tools that help you move around (mobility aids) if they are needed. These include: Canes. Walkers. Scooters. Crutches. Turn on the lights when you go into a dark area. Replace any light bulbs as soon as they burn out. Set up your furniture so you have a clear path. Avoid moving your furniture around. If any of your floors are uneven, fix them. If there are any pets around you, be aware of where they are. Review your medicines with your doctor. Some medicines can make you feel dizzy. This can increase your chance of falling. Ask your doctor what other things that you can do to help prevent falls. This information is not intended to replace advice given to you by your health care provider. Make sure you discuss any questions you have with your health care provider. Document Released: 08/26/2009 Document Revised: 04/06/2016 Document Reviewed: 12/04/2014 Elsevier Interactive Patient Education  2017 [employer].

## 2023-03-19 NOTE — Telephone Encounter (Signed)
Patient calling in to check in on the status of his referral for bone density scan that was ordered and also a refill on Fosamax.  Richard Bhatnagar N. [name], LPN. [facility] AWV Team Direct Dial: [phone]

## 2023-04-10 NOTE — Progress Notes (Unsigned)
  Electrophysiology Office Note:   Date:  04/11/2023  ID:  Richard Blake, DOB 1946/10/31, MRN [id number]  Primary Cardiologist: Lewayne Bunting, MD Electrophysiologist: Lewayne Bunting, MD   History of Present Illness:   Richard Blake is a 77 y.o. male with h/o permanent atrial fibrillation, HTN, and Chronic diastolic CHF seen today for routine electrophysiology followup. Since last being seen in our clinic the patient reports doing well from a cardiac perspective.  He fell and landed on his knee cap, and is following up with PCP for this. Has had some swelling and some gout as well.  he denies chest pain, palpitations, dyspnea, PND, orthopnea, nausea, vomiting, dizziness, syncope, edema, weight gain, or early satiety.   Review of systems complete and found to be negative unless listed in HPI.   Studies Reviewed:    EKG is ordered today. Personal review shows rate controlled AF at 68 bpm  Physical Exam:   VS:  BP (!) 150/80   Pulse 68   Ht 5\' 6"  (1.676 m)   Wt 179 lb 12.8 oz (81.6 kg)   SpO2 99%   BMI 29.02 kg/m    Wt Readings from Last 3 Encounters:  04/11/23 179 lb 12.8 oz (81.6 kg)  03/19/23 177 lb (80.3 kg)  12/27/22 179 lb (81.2 kg)     GEN: Well nourished, well developed in no acute distress NECK: No JVD; No carotid bruits CARDIAC: Irregularly irregular rate and rhythm, no murmurs, rubs, gallops RESPIRATORY:  Clear to auscultation without rales, wheezing or rhonchi  ABDOMEN: Soft, non-tender, non-distended EXTREMITIES:  No edema; No deformity   ASSESSMENT AND PLAN:    Permanent atrial fibrillation Rates controlled Continue beta blocker Recent labs (11/2022 and 12/2022) stable and sees PCP next week.   HTN Elevated on arrival today, likely whitecoat component. Pt keeps meticulous notes at home with BPs in 110-120s systolic consistently.  Chronic diastolic CHF Echo 50-55% 2018 Encouraged limiting fluid and salt intake to help with need for torsemide use.   Follow up  with Dr. Ladona Ridgel in 12 months, sooner with issues.   Signed, Graciella Freer, PA-C

## 2023-04-11 ENCOUNTER — Ambulatory Visit: Payer: Medicare Other | Attending: Student | Admitting: Student

## 2023-04-11 ENCOUNTER — Encounter: Payer: Self-pay | Admitting: Student

## 2023-04-11 VITALS — BP 150/80 | HR 68 | Ht 66.0 in | Wt 179.8 lb

## 2023-04-11 DIAGNOSIS — I5032 Chronic diastolic (congestive) heart failure: Secondary | ICD-10-CM | POA: Insufficient documentation

## 2023-04-11 DIAGNOSIS — I10 Essential (primary) hypertension: Secondary | ICD-10-CM | POA: Insufficient documentation

## 2023-04-11 DIAGNOSIS — I4819 Other persistent atrial fibrillation: Secondary | ICD-10-CM | POA: Insufficient documentation

## 2023-04-11 NOTE — Patient Instructions (Signed)
Medication Instructions:  Your physician recommends that you continue on your current medications as directed. Please refer to the Current Medication list given to you today.  *If you need a refill on your cardiac medications before your next appointment, please call your pharmacy*  Lab Work: None ordered If you have labs (blood work) drawn today and your tests are completely normal, you will receive your results only by: MyChart Message (if you have MyChart) OR A paper copy in the mail If you have any lab test that is abnormal or we need to change your treatment, we will call you to review the results.  Follow-Up: At [facility], you and your health needs are our priority.  As part of our continuing mission to provide you with exceptional heart care, we have created designated Provider Care Teams.  These Care Teams include your primary Cardiologist (physician) and Advanced Practice Providers (APPs -  Physician Assistants and Nurse Practitioners) who all work together to provide you with the care you need, when you need it.  Your next appointment:   1 year(s)  Provider:   Gregg Taylor, MD  

## 2023-04-16 ENCOUNTER — Ambulatory Visit (INDEPENDENT_AMBULATORY_CARE_PROVIDER_SITE_OTHER): Payer: Medicare Other

## 2023-04-16 ENCOUNTER — Encounter: Payer: Self-pay | Admitting: Internal Medicine

## 2023-04-16 ENCOUNTER — Ambulatory Visit (INDEPENDENT_AMBULATORY_CARE_PROVIDER_SITE_OTHER): Payer: Medicare Other | Admitting: Internal Medicine

## 2023-04-16 VITALS — BP 138/70 | HR 57 | Temp 98.2°F | Resp 16 | Ht 66.0 in | Wt 177.0 lb

## 2023-04-16 DIAGNOSIS — M1A39X0 Chronic gout due to renal impairment, multiple sites, without tophus (tophi): Secondary | ICD-10-CM

## 2023-04-16 DIAGNOSIS — S8992XA Unspecified injury of left lower leg, initial encounter: Secondary | ICD-10-CM

## 2023-04-16 DIAGNOSIS — N183 Chronic kidney disease, stage 3 unspecified: Secondary | ICD-10-CM

## 2023-04-16 DIAGNOSIS — M25562 Pain in left knee: Secondary | ICD-10-CM | POA: Diagnosis not present

## 2023-04-16 DIAGNOSIS — D500 Iron deficiency anemia secondary to blood loss (chronic): Secondary | ICD-10-CM

## 2023-04-16 NOTE — Progress Notes (Signed)
Subjective:  Patient ID: Richard Blake, male    DOB: [birth year]  Age: 77 y.o. MRN: [id number]  CC: No chief complaint on file.   HPI Richard Blake presents for f/up ---  He fell 3 weeks ago and complains of persistent swelling over the patella.   Outpatient Medications Prior to Visit  Medication Sig Dispense Refill   acetaminophen (TYLENOL) 500 MG tablet Take 500-1,000 mg by mouth every 6 (six) hours as needed (for pain from muscle spasms).     allopurinol (ZYLOPRIM) 100 MG tablet TAKE 2 TABLETS(200 MG) BY MOUTH DAILY 180 tablet 0   Cholecalciferol 50 MCG (2000 UT) TABS Take 1 tablet (2,000 Units total) by mouth daily. 90 tablet 1   Dietary Management Product (FOSTEUM PLUS) CAPS Take 1 capsule by mouth 2 (two) times daily. 60 capsule 11   ferrous sulfate 325 (65 FE) MG tablet Take 1 tablet (325 mg total) by mouth 3 (three) times daily with meals. 90 tablet 2   finasteride (PROSCAR) 5 MG tablet Take 1 tablet (5 mg total) by mouth daily. 90 tablet 1   losartan (COZAAR) 100 MG tablet Take 1 tablet (100 mg total) by mouth daily. Pt needs appt for any future refills. Please call office to schedule appt. 30 tablet 0   metoprolol tartrate (LOPRESSOR) 25 MG tablet TAKE 1 TABLET(25 MG) BY MOUTH TWICE DAILY 180 tablet 1   pantoprazole (PROTONIX) 40 MG tablet TAKE 1 TABLET(40 MG) BY MOUTH DAILY 90 tablet 1   Rivaroxaban (XARELTO) 15 MG TABS tablet Take 1 tablet (15 mg total) by mouth daily with supper. 90 tablet 1   rosuvastatin (CRESTOR) 10 MG tablet Take 1 tablet (10 mg total) by mouth daily. 90 tablet 1   STIOLTO RESPIMAT 2.5-2.5 MCG/ACT AERS INHALE 2 PUFFS INTO THE LUNGS DAILY 12 g 1   SYSTANE ULTRA 0.4-0.3 % SOLN SMARTSIG:1 Drop(s) In Eye(s) As Needed     torsemide (DEMADEX) 20 MG tablet TAKE 1 TABLET(20 MG) BY MOUTH DAILY 90 tablet 0   vitamin B-12 (CYANOCOBALAMIN) 250 MCG tablet Take 250 mcg by mouth daily.     No facility-administered medications prior to visit.    ROS Review  of Systems  Objective:  BP 138/70 (BP Location: Left Arm, Patient Position: Sitting, Cuff Size: Large)   Pulse (!) 57   Temp 98.2 F (36.8 C) (Oral)   Resp 16   Ht 5\' 6"  (1.676 m)   Wt 177 lb (80.3 kg)   SpO2 97%   BMI 28.57 kg/m   BP Readings from Last 3 Encounters:  04/16/23 138/70  04/11/23 (!) 150/80  03/19/23 126/87    Wt Readings from Last 3 Encounters:  04/16/23 177 lb (80.3 kg)  04/11/23 179 lb 12.8 oz (81.6 kg)  03/19/23 177 lb (80.3 kg)    Physical Exam Vitals reviewed.  HENT:     Nose: Nose normal.     Mouth/Throat:     Mouth: Mucous membranes are moist.  Eyes:     General: No scleral icterus.    Conjunctiva/sclera: Conjunctivae normal.  Cardiovascular:     Rate and Rhythm: Bradycardia present. Rhythm irregularly irregular.     Heart sounds: No murmur heard.    No gallop.  Pulmonary:     Effort: Pulmonary effort is normal.     Breath sounds: No stridor. No wheezing, rhonchi or rales.  Abdominal:     General: Abdomen is flat.     Palpations: There is no mass.  Tenderness: There is no abdominal tenderness. There is no guarding.     Hernia: No hernia is present.  Musculoskeletal:     Cervical back: Neck supple.     Right lower leg: 1+ Edema present.     Left lower leg: 2+ Edema present.       Legs:  Lymphadenopathy:     Cervical: No cervical adenopathy.  Skin:    General: Skin is warm and dry.  Neurological:     General: No focal deficit present.     Mental Status: He is alert. Mental status is at baseline.  Psychiatric:        Mood and Affect: Mood normal.        Behavior: Behavior normal.     Lab Results  Component Value Date   WBC 3.6 (L) 04/17/2023   HGB 12.9 (L) 04/17/2023   HCT 40.7 04/17/2023   PLT 322.0 04/17/2023   GLUCOSE 80 04/17/2023   CHOL 185 12/27/2022   TRIG 116.0 12/27/2022   HDL 52.80 12/27/2022   LDLDIRECT 186.5 10/23/2012   LDLCALC 109 (H) 12/27/2022   ALT 15 04/22/2018   AST 15 04/22/2018   NA 140  04/17/2023   K 4.3 04/17/2023   CL 99 04/17/2023   CREATININE 1.74 (H) 04/17/2023   BUN 18 04/17/2023   CO2 32 04/17/2023   TSH 1.30 12/27/2022   PSA 4.00 12/27/2022   INR 1.1 12/14/2016    CT CHEST HIGH RESOLUTION  Result Date: 08/30/2022 CLINICAL DATA:  Interstitial lung disease EXAM: CT CHEST WITHOUT CONTRAST TECHNIQUE: Multidetector CT imaging of the chest was performed following the standard protocol without intravenous contrast. High resolution imaging of the lungs, as well as inspiratory and expiratory imaging, was performed. RADIATION DOSE REDUCTION: This exam was performed according to the departmental dose-optimization program which includes automated exposure control, adjustment of the mA and/or kV according to patient size and/or use of iterative reconstruction technique. COMPARISON:  None Available. FINDINGS: Cardiovascular: Cardiomegaly. No pericardial effusion. Normal caliber thoracic aorta with mild calcified plaque. Dilated main pulmonary artery, measuring up to 4.0 cm. Moderate coronary artery calcifications of the LAD. Mediastinum/Nodes: Large hiatal hernia containing the stomach and a portion of the transverse colon. Fluid attenuation paraesophageal structure seen on series 2, image 137, likely a lymphocele. No pathologically enlarged lymph nodes seen in the chest. Lungs/Pleura: Central airways are patent. Biapical pleural-parenchymal scarring. Moderate air trapping. Upper lobe and peribronchovascular predominant reticular opacities with mild associated ground-glass opacities and honeycomb change. No consolidation, pleural effusion or pneumothorax. Upper Abdomen: Partially visualized bilateral low-attenuation renal lesions which are likely simple cysts, no specific follow-up imaging is recommended. No acute abnormality. Musculoskeletal: No chest wall mass or suspicious bone lesions identified. IMPRESSION: 1. 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 Findings are suggestive of an alternative diagnosis (not UIP) per consensus guidelines: Diagnosis of Idiopathic Pulmonary Fibrosis: An Official ATS/ERS/JRS/ALAT Clinical Practice Guideline. Am Rosezetta Schlatter Crit Care Med Vol 198, Iss 5, [phone], Jul 14 2017. 2. Bilateral air trapping, findings can be seen in the setting of sarcoidosis. 3. Large hiatal hernia containing the stomach and a portion of the transverse colon. 4. Aortic Atherosclerosis (ICD10-I70.0). Electronically Signed   By: Allegra Lai M.D.   On: 08/30/2022 10:36   DG Knee Complete 4 Views Left  Result Date: 04/16/2023 CLINICAL DATA:  Left knee pain after a fall onto the patella. EXAM: LEFT KNEE - COMPLETE 4+ VIEW  COMPARISON:  None Available. FINDINGS: There is no acute fracture or dislocation. Knee alignment is normal. There is mild tricompartmental joint space narrowing with associated osteophytosis consistent with osteoarthritis. The soft tissues are unremarkable. There is no significant effusion. IMPRESSION: No acute fracture or dislocation. Mild tricompartmental osteoarthritis. Electronically Signed   By: Lesia Hausen M.D.   On: 04/16/2023 16:10     Assessment & Plan:   Injury of left knee, initial encounter- exam and films are reassuring. He was reassured that his body will absorb this. -     DG Knee Complete 4 Views Left; Future  Chronic gout due to renal impairment of multiple sites without tophus -     Basic metabolic panel; Future -     Uric acid; Future  CRI (chronic renal insufficiency), stage 3 (moderate)- Renal function is stable. -     Basic metabolic panel; Future  Iron deficiency anemia due to chronic blood loss- H/H are stable. -     CBC with Differential/Platelet; Future     Follow-up: Return in about 3 months (around 07/17/2023).  Sanda Linger, MD

## 2023-04-16 NOTE — Patient Instructions (Signed)
Hematoma A hematoma is a collection of blood under the skin, in an organ, in a body space, in a joint space, or in other tissue. The blood can thicken (clot) to form a lump that you can see and feel. The lump is often firm and may become sore and tender. Most hematomas get better in a few days to weeks. However, some hematomas may be serious and require medical care. Hematomas can range from very small to very large. What are the causes? This condition is caused by: A blunt or penetrating injury. Leakage from a blood vessel under the skin. Some medical procedures, including surgeries, such as oral surgery, face lifts, and surgeries on the joints. Some medical conditions that cause bleeding or bruising. There may be multiple hematomas that appear in different areas of the body. What increases the risk? You are more likely to develop this condition if: You are an older adult. You use blood thinners. You regularly use NSAIDs, such as ibuprofen, for pain. You play contact sports. What are the signs or symptoms?  Symptoms of this condition depend on where the hematoma is located.  Common symptoms of a hematoma that is under the skin include: A firm lump on the body. Pain and tenderness in the area. Bruising. Blue, dark blue, purple-red, or yellowish skin (discoloration) may appear at the site of the hematoma if the hematoma is close to the surface of the skin. Common symptoms of a hematoma that is deep in the tissues or body spaces may be less obvious. They include: A collection of blood in the stomach (intra-abdominal hematoma). This may cause pain in the abdomen, weakness, fainting, and shortness of breath. A collection of blood in the head (intracranial hematoma). This may cause a headache or symptoms such as weakness, trouble speaking or understanding, or a change in consciousness. How is this diagnosed? This condition is diagnosed based on: Your medical history. A physical exam. Imaging  tests, such as an ultrasound or CT scan. These may be needed if your health care provider suspects a hematoma in deeper tissues or body spaces. Blood tests. These may be needed if your health care provider believes that the hematoma is caused by a medical condition. How is this treated? Treatment for this condition depends on the cause, size, and location of the hematoma. Treatment may include: Doing nothing. The majority of hematomas do not need treatment as many of them go away on their own. Surgery or close monitoring. This may be needed for large hematomas or hematomas that affect vital organs. Medicines. Medicines may be given if there is an underlying medical cause for the hematoma. Follow these instructions at home: Managing pain, stiffness, and swelling  If directed, put ice on the injured area. To do this: Put ice in a plastic bag. Place a towel between your skin and the bag. Leave the ice on for 20 minutes, 2-3 times a day for the first couple of days. If your skin turns bright red, remove the ice right away to prevent skin damage. The risk of skin damage is higher if you cannot feel pain, heat, or cold. If directed, apply heat to the affected area as often as told by your health care provider. Use the heat source that your health care provider recommends, such as a moist heat pack or a heating pad. Place a towel between your skin and the heat source. Leave the heat on for 20-30 minutes. If your skin turns bright red, remove the heat right  away to prevent burns. The risk of burns is higher if you cannot feel pain, heat, or cold. Raise (elevate) the injured area above the level of your heart while you are sitting or lying down. If directed, wrap the affected area with an elastic bandage. The bandage applies pressure (compression) to the area, which may help to reduce swelling and promote healing. Do not wrap the bandage too tightly around the affected area. If your hematoma is on a leg  or foot (lower extremity) and is painful, your health care provider may recommend crutches. Use them as told by your health care provider. General instructions Take over-the-counter and prescription medicines only as told by your health care provider. Rest the injured area as directed by your health care provider. Keep all follow-up visits. Your health care provider may want to see how your hematoma is progressing with treatment. Contact a health care provider if: You have a fever. The swelling or discoloration gets worse. You develop more hematomas. Your pain is worse or your pain is not controlled with medicine. Your skin over the hematoma breaks or starts bleeding. Get help right away if: Your hematoma is in your chest or abdomen and you have weakness, shortness of breath, or a change in consciousness. You have a hematoma on your scalp that is caused by a fall or injury, and you also have: A headache that gets worse. Trouble speaking or understanding speech. Weakness. A change in alertness or consciousness. These symptoms may be an emergency. Get help right away. Call 911. Do not wait to see if the symptoms will go away. Do not drive yourself to the hospital. This information is not intended to replace advice given to you by your health care provider. Make sure you discuss any questions you have with your health care provider. Document Revised: 04/24/2022 Document Reviewed: 04/24/2022 Elsevier Patient Education  2024 [employer].

## 2023-04-17 LAB — CBC WITH DIFFERENTIAL/PLATELET
Basophils Absolute: 0 10*3/uL (ref 0.0–0.1)
Basophils Relative: 1 % (ref 0.0–3.0)
Eosinophils Absolute: 0.2 10*3/uL (ref 0.0–0.7)
Eosinophils Relative: 5.2 % — ABNORMAL HIGH (ref 0.0–5.0)
HCT: 40.7 % (ref 39.0–52.0)
Hemoglobin: 12.9 g/dL — ABNORMAL LOW (ref 13.0–17.0)
Lymphocytes Relative: 23.3 % (ref 12.0–46.0)
Lymphs Abs: 0.8 10*3/uL (ref 0.7–4.0)
MCHC: 31.7 g/dL (ref 30.0–36.0)
MCV: 87.9 fl (ref 78.0–100.0)
Monocytes Absolute: 0.3 10*3/uL (ref 0.1–1.0)
Monocytes Relative: 9 % (ref 3.0–12.0)
Neutro Abs: 2.2 10*3/uL (ref 1.4–7.7)
Neutrophils Relative %: 61.5 % (ref 43.0–77.0)
Platelets: 322 10*3/uL (ref 150.0–400.0)
RBC: 4.64 Mil/uL (ref 4.22–5.81)
RDW: 15.1 % (ref 11.5–15.5)
WBC: 3.6 10*3/uL — ABNORMAL LOW (ref 4.0–10.5)

## 2023-04-17 LAB — BASIC METABOLIC PANEL
BUN: 18 mg/dL (ref 6–23)
CO2: 32 mEq/L (ref 19–32)
Calcium: 9.2 mg/dL (ref 8.4–10.5)
Chloride: 99 mEq/L (ref 96–112)
Creatinine, Ser: 1.74 mg/dL — ABNORMAL HIGH (ref 0.40–1.50)
GFR: 37.85 mL/min — ABNORMAL LOW (ref >60.00)
Glucose, Bld: 80 mg/dL (ref 70–99)
Potassium: 4.3 mEq/L (ref 3.5–5.1)
Sodium: 140 mEq/L (ref 135–145)

## 2023-04-17 LAB — URIC ACID: Uric Acid, Serum: 5.9 mg/dL (ref 4.0–7.8)

## 2023-05-03 ENCOUNTER — Emergency Department (HOSPITAL_COMMUNITY)
Admission: EM | Admit: 2023-05-03 | Discharge: 2023-05-03 | Disposition: A | Discharge status: Home / Self Care | Payer: Medicare Other | Attending: Emergency Medicine | Admitting: Emergency Medicine

## 2023-05-03 ENCOUNTER — Other Ambulatory Visit: Payer: Self-pay | Admitting: Internal Medicine

## 2023-05-03 ENCOUNTER — Encounter (HOSPITAL_COMMUNITY): Payer: Self-pay

## 2023-05-03 ENCOUNTER — Other Ambulatory Visit: Payer: Self-pay

## 2023-05-03 ENCOUNTER — Emergency Department (HOSPITAL_COMMUNITY): Payer: Medicare Other

## 2023-05-03 DIAGNOSIS — I10 Essential (primary) hypertension: Secondary | ICD-10-CM | POA: Insufficient documentation

## 2023-05-03 DIAGNOSIS — Z23 Encounter for immunization: Secondary | ICD-10-CM | POA: Insufficient documentation

## 2023-05-03 DIAGNOSIS — S0990XA Unspecified injury of head, initial encounter: Secondary | ICD-10-CM | POA: Diagnosis not present

## 2023-05-03 DIAGNOSIS — S01511A Laceration without foreign body of lip, initial encounter: Secondary | ICD-10-CM | POA: Diagnosis not present

## 2023-05-03 DIAGNOSIS — I4891 Unspecified atrial fibrillation: Secondary | ICD-10-CM | POA: Diagnosis not present

## 2023-05-03 DIAGNOSIS — Y92513 Shop (commercial) as the place of occurrence of the external cause: Secondary | ICD-10-CM | POA: Insufficient documentation

## 2023-05-03 DIAGNOSIS — S199XXA Unspecified injury of neck, initial encounter: Secondary | ICD-10-CM | POA: Diagnosis not present

## 2023-05-03 DIAGNOSIS — W01190A Fall on same level from slipping, tripping and stumbling with subsequent striking against furniture, initial encounter: Secondary | ICD-10-CM | POA: Insufficient documentation

## 2023-05-03 DIAGNOSIS — R930 Abnormal findings on diagnostic imaging of skull and head, not elsewhere classified: Secondary | ICD-10-CM | POA: Insufficient documentation

## 2023-05-03 DIAGNOSIS — S0993XA Unspecified injury of face, initial encounter: Secondary | ICD-10-CM | POA: Diagnosis present

## 2023-05-03 DIAGNOSIS — I4819 Other persistent atrial fibrillation: Secondary | ICD-10-CM

## 2023-05-03 DIAGNOSIS — Z79899 Other long term (current) drug therapy: Secondary | ICD-10-CM | POA: Insufficient documentation

## 2023-05-03 DIAGNOSIS — Z7901 Long term (current) use of anticoagulants: Secondary | ICD-10-CM | POA: Insufficient documentation

## 2023-05-03 MED ORDER — TETANUS-DIPHTH-ACELL PERTUSSIS 5-2.5-18.5 LF-MCG/0.5 IM SUSY
0.5000 mL | PREFILLED_SYRINGE | Freq: Once | INTRAMUSCULAR | Status: AC
  Administered 2023-05-03: 0.5 mL via INTRAMUSCULAR
  Filled 2023-05-03: qty 0.5

## 2023-05-03 MED ORDER — LIDOCAINE HCL (PF) 1 % IJ SOLN
10.0000 mL | Freq: Once | INTRAMUSCULAR | Status: AC
  Administered 2023-05-03: 10 mL
  Filled 2023-05-03: qty 30

## 2023-05-03 NOTE — ED Notes (Signed)
Pt provided discharge instructions and prescription information. Pt was given the opportunity to ask questions and questions were answered.   

## 2023-05-03 NOTE — ED Notes (Signed)
EDP made aware of pt's V/S.

## 2023-05-03 NOTE — Discharge Instructions (Addendum)
The Sutures placed today will dissolve on their own.  Follow-up with your dentist as planned to evaluate your tooth injury.  Blood pressure was elevated today.  Make sure to take your medication when you get home

## 2023-05-03 NOTE — ED Triage Notes (Signed)
Mechanical fall at the store. Pt fell over some boxes. He hit his face on the bench, lip laceration to upper and lower lip. Pt cracked his tooth, states this is a partial tooth already and has no pain. Denies LOC. Pt is on blood thinners.

## 2023-05-03 NOTE — ED Provider Notes (Signed)
[facility] AT [facility] Provider Note   CSN: [id number] Arrival date & time: 05/03/23  1843     History  Chief Complaint  Patient presents with   Lip Laceration   Fall    Richard Blake is a 76 y.o. male.   Fall   Patient has a history of interstitial lung disease, sarcoidosis, hypertension, atrial fibrillation on Xarelto who presents ED after a fall.  Patient states he was at the shoe store and tripped over boxes.  He ended up hitting his lip and face against a bench.  Patient sustained a laceration to his upper and lower lip.  Patient did chip his upper tooth however that is a false tooth.  Patient denies loss of consciousness.  No headache or neck pain.  No facial pain other than the lip laceration.  No jaw malocclusion    Home Medications Prior to Admission medications   Medication Sig Start Date End Date Taking? Authorizing Provider  acetaminophen (TYLENOL) 500 MG tablet Take 500-1,000 mg by mouth every 6 (six) hours as needed (for pain from muscle spasms).    [provider]  allopurinol (ZYLOPRIM) 100 MG tablet TAKE 2 TABLETS(200 MG) BY MOUTH DAILY 01/26/23   [name], MD  Cholecalciferol 50 MCG (2000 UT) TABS Take 1 tablet (2,000 Units total) by mouth daily. 12/27/22   [name], MD  Dietary Management Product (FOSTEUM PLUS) CAPS Take 1 capsule by mouth 2 (two) times daily. 08/19/19   [name], MD  ferrous sulfate 325 (65 FE) MG tablet Take 1 tablet (325 mg total) by mouth 3 (three) times daily with meals. 12/04/19   [name], MD  finasteride (PROSCAR) 5 MG tablet Take 1 tablet (5 mg total) by mouth daily. 12/27/22   [name], MD  losartan (COZAAR) 100 MG tablet Take 1 tablet (100 mg total) by mouth daily. Pt needs appt for any future refills. Please call office to schedule appt. 02/27/23   Marinus Maw, MD  metoprolol tartrate (LOPRESSOR) 25 MG tablet TAKE 1 TABLET(25 MG) BY MOUTH TWICE DAILY  05/03/23   [name], MD  pantoprazole (PROTONIX) 40 MG tablet TAKE 1 TABLET(40 MG) BY MOUTH DAILY 12/10/20   [name], MD  Rivaroxaban (XARELTO) 15 MG TABS tablet Take 1 tablet (15 mg total) by mouth daily with supper. 12/27/22   [name], MD  rosuvastatin (CRESTOR) 10 MG tablet Take 1 tablet (10 mg total) by mouth daily. 12/27/22   [name], MD  STIOLTO RESPIMAT 2.5-2.5 MCG/ACT AERS INHALE 2 PUFFS INTO THE LUNGS DAILY 02/21/22   [name], MD  SYSTANE ULTRA 0.4-0.3 % SOLN SMARTSIG:1 Drop(s) In Eye(s) As Needed 01/20/20   [provider]  torsemide (DEMADEX) 20 MG tablet TAKE 1 TABLET(20 MG) BY MOUTH DAILY 05/03/23   [name], MD  vitamin B-12 (CYANOCOBALAMIN) 250 MCG tablet Take 250 mcg by mouth daily.    [provider]      Allergies    Amlodipine, Welchol [colesevelam hcl], Other, and Prolia [denosumab]    Review of Systems   Review of Systems  Physical Exam Updated Vital Signs BP (!) 171/110 (BP Location: Right Arm) Comment: pt did not take his B/P medicine today.  Pulse 64   Temp 98.5 F (36.9 C) (Oral)   Resp 18   Ht 1.676 m (5\' 6" )   Wt 80.3 kg   SpO2 100%  BMI 28.57 kg/m  Physical Exam Vitals and nursing note reviewed.  Constitutional:      General: He is not in acute distress.    Appearance: He is well-developed.  HENT:     Head: Normocephalic.     Comments: Laceration noted to the upper lip that does involve the vermilion border, laceration also involving the lower lip that is more superficial, does not cross vermilion border    Right Ear: External ear normal.     Left Ear: External ear normal.  Eyes:     General: No scleral icterus.       Right eye: No discharge.        Left eye: No discharge.     Conjunctiva/sclera: Conjunctivae normal.  Neck:     Trachea: No tracheal deviation.  Cardiovascular:     Rate and Rhythm: Normal rate.  Pulmonary:     Effort: Pulmonary effort is normal. No respiratory distress.      Breath sounds: No stridor.  Abdominal:     General: There is no distension.  Musculoskeletal:        General: No swelling or deformity.     Cervical back: Neck supple.     Comments: No tenderness palpation cervical thoracic lumbar spine  Skin:    General: Skin is warm and dry.     Findings: No rash.  Neurological:     Mental Status: He is alert. Mental status is at baseline.     Cranial Nerves: No dysarthria or facial asymmetry.     Motor: No seizure activity.     ED Results / Procedures / Treatments   Labs (all labs ordered are listed, but only abnormal results are displayed) Labs Reviewed - No data to display  EKG None  Radiology CT Cervical Spine Wo Contrast  Result Date: 05/03/2023 CLINICAL DATA:  Neck trauma (Age >= 65y) Fall at a store. EXAM: CT CERVICAL SPINE WITHOUT CONTRAST TECHNIQUE: Multidetector CT imaging of the cervical spine was performed without intravenous contrast. Multiplanar CT image reconstructions were also generated. RADIATION DOSE REDUCTION: This exam was performed according to the departmental dose-optimization program which includes automated exposure control, adjustment of the mA and/or kV according to patient size and/or use of iterative reconstruction technique. COMPARISON:  None Available. FINDINGS: Alignment: Exaggerated upper thoracic kyphosis with straightening of normal lordosis. Grade 1 retrolisthesis of C3 on C4. Skull base and vertebrae: No acute fracture. Vertebral body heights are maintained. The dens and skull base are intact. Extensive laminectomies extending from C3 through T2. Soft tissues and spinal canal: No prevertebral soft tissue thickening. Disc levels: Disc space narrowing and spurring from C3-C4 through C6-C7. Multilevel facet hypertrophy. Prior posterior decompression. Upper chest: Chronic lung disease which was assessed on chest CT 08/29/2022. No acute findings. Other: None. IMPRESSION: 1. No fracture or subluxation of the cervical  spine. 2. Multilevel degenerative change as well as prior posterior decompression. Electronically Signed   By: Narda Rutherford M.D.   On: 05/03/2023 19:59   CT Head Wo Contrast  Result Date: 05/03/2023 CLINICAL DATA:  Head trauma, minor (Age >= 65y) Fall at a store. EXAM: CT HEAD WITHOUT CONTRAST TECHNIQUE: Contiguous axial images were obtained from the base of the skull through the vertex without intravenous contrast. RADIATION DOSE REDUCTION: This exam was performed according to the departmental dose-optimization program which includes automated exposure control, adjustment of the mA and/or kV according to patient size and/or use of iterative reconstruction technique. COMPARISON:  None Available. FINDINGS: Brain: No  intracranial hemorrhage, mass effect, or midline shift. No hydrocephalus. The basilar cisterns are patent. No evidence of territorial infarct or acute ischemia. Mild periventricular chronic small vessel ischemia. No extra-axial or intracranial fluid collection. Vascular: Atherosclerosis of skullbase vasculature without hyperdense vessel or abnormal calcification. Skull: No fracture or focal lesion. Sinuses/Orbits: No acute finding.  Bilateral cataract resection. Other: No confluent scalp hematoma. IMPRESSION: 1. No acute intracranial abnormality. No skull fracture. 2. Mild chronic small vessel ischemia. Electronically Signed   By: Narda Rutherford M.D.   On: 05/03/2023 19:55    Procedures Procedures    Medications Ordered in ED Medications  Tdap (BOOSTRIX) injection 0.5 mL (0.5 mLs Intramuscular Given 05/03/23 1942)  lidocaine (PF) (XYLOCAINE) 1 % injection 10 mL (10 mLs Infiltration Given by Other 05/03/23 1944)    ED Course/ Medical Decision Making/ A&P Clinical Course as of 05/03/23 2053  Thu May 03, 2023  2043 X-rays without signs of acute fracture or serious injury [JK]    Clinical Course User Index [JK] Linwood Dibbles, MD                             Medical Decision  Making Problems Addressed: Hypertension, unspecified type: chronic illness or injury Lip laceration, initial encounter: acute illness or injury that poses a threat to life or bodily functions  Amount and/or Complexity of Data Reviewed Radiology: ordered and independent interpretation performed.  Risk Prescription drug management.   Patient presents to the ED for evaluation of a facial injury.  Patient is on anticoagulants but fortunately no signs of serious injury noted on head CT or C-spine CT.  Lip laceration repaired by PA Laveda Norman.   Pt plans to follow up with his dentist regarding his dental injury.  HTN noted but pt has not taken his medications today.  Will take them when he gets home.          Final Clinical Impression(s) / ED Diagnoses Final diagnoses:  Lip laceration, initial encounter  Hypertension, unspecified type    Rx / DC Orders ED Discharge Orders     None         Linwood Dibbles, MD 05/03/23 2053

## 2023-05-03 NOTE — ED Provider Notes (Signed)
Lips laceration was performed by me per Dr. Delford Field request  ..Laceration Repair  Date/Time: 05/03/2023 8:11 PM  Performed by: Fayrene Helper, PA-C Authorized by: Fayrene Helper, PA-C   Consent:    Consent obtained:  Verbal   Consent given by:  Patient   Risks, benefits, and alternatives were discussed: yes     Risks discussed:  Infection and poor wound healing   Alternatives discussed:  No treatment Universal protocol:    Procedure explained and questions answered to patient or proxy's satisfaction: yes     Relevant documents present and verified: yes     Patient identity confirmed:  Verbally with patient and arm band Anesthesia:    Anesthesia method:  Local infiltration   Local anesthetic:  Lidocaine 1% w/o epi Laceration details:    Location:  Lip   Lip location:  Upper exterior lip   Length (cm):  3   Depth (mm):  4 Pre-procedure details:    Preparation:  Patient was prepped and draped in usual sterile fashion and imaging obtained to evaluate for foreign bodies Exploration:    Limited defect created (wound extended): no     Hemostasis achieved with:  Direct pressure   Imaging outcome: foreign body not noted     Wound exploration: wound explored through full range of motion and entire depth of wound visualized     Wound extent: muscle damage     Wound extent: no underlying fracture     Contaminated: no   Treatment:    Area cleansed with:  Saline   Amount of cleaning:  Standard   Irrigation solution:  Sterile saline   Irrigation method:  Pressure wash   Debridement:  None   Undermining:  None   Scar revision: no   Skin repair:    Repair method:  Sutures   Suture size:  5-0   Suture material:  Chromic gut   Suture technique:  Simple interrupted   Number of sutures:  5 Approximation:    Approximation:  Close   Vermilion border well-aligned: yes   Repair type:    Repair type:  Intermediate Post-procedure details:    Procedure completion:  Tolerated well, no immediate  complications .Marland KitchenLaceration Repair  Date/Time: 05/03/2023 8:12 PM  Performed by: Fayrene Helper, PA-C Authorized by: Fayrene Helper, PA-C   Consent:    Consent obtained:  Verbal   Consent given by:  Patient   Risks, benefits, and alternatives were discussed: yes     Risks discussed:  Infection and poor wound healing   Alternatives discussed:  No treatment Universal protocol:    Procedure explained and questions answered to patient or proxy's satisfaction: yes     Relevant documents present and verified: yes     Patient identity confirmed:  Verbally with patient and arm band Laceration details:    Location:  Lip   Lip location:  Lower interior lip   Length (cm):  2   Depth (mm):  3 Pre-procedure details:    Preparation:  Patient was prepped and draped in usual sterile fashion Exploration:    Limited defect created (wound extended): no     Hemostasis achieved with:  Direct pressure   Imaging outcome: foreign body not noted     Wound exploration: wound explored through full range of motion and entire depth of wound visualized     Wound extent: no signs of injury and no underlying fracture     Contaminated: no   Treatment:    Area cleansed with:  Saline   Amount of cleaning:  Standard   Irrigation solution:  Sterile saline   Irrigation method:  Pressure wash   Debridement:  None   Undermining:  None   Scar revision: no   Skin repair:    Repair method:  Sutures   Suture size:  5-0   Suture material:  Chromic gut   Suture technique:  Simple interrupted   Number of sutures:  2 Approximation:    Approximation:  Close   Vermilion border well-aligned: yes   Repair type:    Repair type:  Simple Post-procedure details:    Dressing:  Open (no dressing)   Procedure completion:  Tolerated well, no immediate complications     Fayrene Helper, PA-C 05/03/23 2013    Linwood Dibbles, MD 05/04/23 [phone]

## 2023-05-10 ENCOUNTER — Telehealth: Payer: Self-pay | Admitting: *Deleted

## 2023-05-10 NOTE — Telephone Encounter (Signed)
Transition Care Management Unsuccessful Follow-up Telephone Call  Date of discharge and from where:  [facility] 05/03/2023  Attempts:  1st Attempt  Reason for unsuccessful TCM follow-up call:  Left voice message

## 2023-05-14 ENCOUNTER — Telehealth: Payer: Self-pay | Admitting: *Deleted

## 2023-05-14 NOTE — Telephone Encounter (Signed)
Transition Care Management Follow-up Telephone Call Date of discharge and from where: [facility] 05/03/2023 How have you been since you were released from the hospital? Feeling well , the wound is healing  Any questions or concerns? No  Items Reviewed: Did the pt receive and understand the discharge instructions provided? Yes  Medications obtained and verified? Yes  Other? No  Any new allergies since your discharge? No  Dietary orders reviewed? No Do you have support at home? Yes     Follow up appointments reviewed:  Will not go to see unless there is an issue with healing  Are transportation arrangements needed? No  If their condition worsens, is the pt aware to call PCP or go to the Emergency Dept.? Yes Was the patient provided with contact information for the PCP's office or ED? Yes Was to pt encouraged to call back with questions or concerns? Yes

## 2023-06-06 DIAGNOSIS — M109 Gout, unspecified: Secondary | ICD-10-CM | POA: Diagnosis not present

## 2023-06-06 DIAGNOSIS — I480 Paroxysmal atrial fibrillation: Secondary | ICD-10-CM | POA: Diagnosis not present

## 2023-06-06 DIAGNOSIS — I129 Hypertensive chronic kidney disease with stage 1 through stage 4 chronic kidney disease, or unspecified chronic kidney disease: Secondary | ICD-10-CM | POA: Diagnosis not present

## 2023-06-06 DIAGNOSIS — N183 Chronic kidney disease, stage 3 unspecified: Secondary | ICD-10-CM | POA: Diagnosis not present

## 2023-06-06 DIAGNOSIS — D509 Iron deficiency anemia, unspecified: Secondary | ICD-10-CM | POA: Diagnosis not present

## 2023-06-12 DIAGNOSIS — M9904 Segmental and somatic dysfunction of sacral region: Secondary | ICD-10-CM | POA: Diagnosis not present

## 2023-06-12 DIAGNOSIS — M9903 Segmental and somatic dysfunction of lumbar region: Secondary | ICD-10-CM | POA: Diagnosis not present

## 2023-06-12 DIAGNOSIS — M5136 Other intervertebral disc degeneration, lumbar region: Secondary | ICD-10-CM | POA: Diagnosis not present

## 2023-06-12 DIAGNOSIS — M9905 Segmental and somatic dysfunction of pelvic region: Secondary | ICD-10-CM | POA: Diagnosis not present

## 2023-06-25 ENCOUNTER — Telehealth: Payer: Self-pay | Admitting: Internal Medicine

## 2023-06-25 NOTE — Telephone Encounter (Signed)
LVM for patient to get scheduled for DEXA appointment. If they call back, please schedule.

## 2023-06-26 ENCOUNTER — Ambulatory Visit (INDEPENDENT_AMBULATORY_CARE_PROVIDER_SITE_OTHER)
Admission: RE | Admit: 2023-06-26 | Discharge: 2023-06-26 | Disposition: A | Discharge status: Home / Self Care | Payer: Medicare Other | Source: Ambulatory Visit | Attending: Internal Medicine | Admitting: Internal Medicine

## 2023-06-26 DIAGNOSIS — M810 Age-related osteoporosis without current pathological fracture: Secondary | ICD-10-CM

## 2023-06-27 ENCOUNTER — Encounter: Payer: Self-pay | Admitting: Pulmonary Disease

## 2023-06-27 ENCOUNTER — Ambulatory Visit: Payer: Medicare Other | Admitting: Pulmonary Disease

## 2023-06-27 VITALS — BP 122/82 | HR 51 | Temp 97.4°F | Ht 68.0 in | Wt 173.0 lb

## 2023-06-27 DIAGNOSIS — J849 Interstitial pulmonary disease, unspecified: Secondary | ICD-10-CM | POA: Diagnosis not present

## 2023-06-27 DIAGNOSIS — D869 Sarcoidosis, unspecified: Secondary | ICD-10-CM

## 2023-06-27 NOTE — Patient Instructions (Signed)
I am glad you are stable with your breathing Will get a high-resolution CT in 1 year Return to clinic in 1 year after scan.

## 2023-06-27 NOTE — Progress Notes (Signed)
Richard Blake    [id number]    09-11-1946  Primary Care Physician:Jones, Bernadene Bell, MD  Referring Physician: [name], MD [address] [place],  [place] [address]  Chief complaint: Follow up for interstitial lung disease, sarcoidosis  HPI: 77 y.o. who  has a past medical history of A-fib (HCC) (11/2016), Anemia, Atypical chest pain, Carpal tunnel syndrome, left, Cataract, Chronic rhinitis, Cough, Diverticulosis, Dyspnea, GERD (gastroesophageal reflux disease), Hiatal hernia, History of recent blood transfusion (06/05/2018), HLD (hyperlipidemia), HTN (hypertension), Interstitial lung disease (HCC), Kidney stones, Osteoarthritis, Osteoporosis (2014), Sarcoidosis (8/99), and Undescended right testicle.   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  He had spinal tissue biopsy in 1986 and a transbronchial lung biopsy in August 1999.  He was followed at that time by Dr. [name] and treated with steroids from 1998 to 2009 when he was weaned off steroids  His chief complaint today is cough with chest congestion that started in 2022.  He relates this temporally to the current dose of COVID-19.Marland Kitchen  He is having a chest x-ray for respiratory failure that showed mild interstitial changes and has been sent in for further evaluation.  He has been started on stiolto inhaler by primary care but cannot tell if it is helping and feels it is too expensive  Pets: No pets Occupation: Retired Hydrologist Exposures: No mold, hot tub, Jacuzzi.  No feather pillows or comforters. ILD questionnaire 07/10/2022-negative Smoking history: Never smoker Travel history: No significant lung disease Relevant family history: Father had TB in the 82s  Interim history: States that breathing is stable Has intermittent cough. Stiolto was stopped at last visit as he is not seeing any benefit  Outpatient  Encounter Medications as of 06/27/2023  Medication Sig   acetaminophen (TYLENOL) 500 MG tablet Take 500-1,000 mg by mouth every 6 (six) hours as needed (for pain from muscle spasms).   allopurinol (ZYLOPRIM) 100 MG tablet TAKE 2 TABLETS(200 MG) BY MOUTH DAILY   Cholecalciferol 50 MCG (2000 UT) TABS Take 1 tablet (2,000 Units total) by mouth daily.   Dietary Management Product (FOSTEUM PLUS) CAPS Take 1 capsule by mouth 2 (two) times daily.   ferrous sulfate 325 (65 FE) MG tablet Take 1 tablet (325 mg total) by mouth 3 (three) times daily with meals.   finasteride (PROSCAR) 5 MG tablet Take 1 tablet (5 mg total) by mouth daily.   losartan (COZAAR) 100 MG tablet TAKE 1 TABLET(100 MG) BY MOUTH DAILY   metoprolol tartrate (LOPRESSOR) 25 MG tablet TAKE 1 TABLET(25 MG) BY MOUTH TWICE DAILY   pantoprazole (PROTONIX) 40 MG tablet TAKE 1 TABLET(40 MG) BY MOUTH DAILY   Rivaroxaban (XARELTO) 15 MG TABS tablet Take 1 tablet (15 mg total) by mouth daily with supper.   rosuvastatin (CRESTOR) 10 MG tablet Take 1 tablet (10 mg total) by mouth daily.   STIOLTO RESPIMAT 2.5-2.5 MCG/ACT AERS INHALE 2 PUFFS INTO THE LUNGS DAILY   SYSTANE ULTRA 0.4-0.3 % SOLN SMARTSIG:1 Drop(s) In Eye(s) As Needed   torsemide (DEMADEX) 20 MG tablet TAKE 1 TABLET(20 MG) BY MOUTH DAILY   vitamin B-12 (CYANOCOBALAMIN) 250 MCG tablet Take 250 mcg by mouth daily.   No facility-administered encounter medications on file as of 06/27/2023.   Physical Exam: Blood pressure 122/82, pulse (!) 51, temperature (!) 97.4 F (36.3 C), temperature source Temporal, height 5\' 8"  (1.727 m), weight 173 lb (78.5  kg), SpO2 97%. Gen:      No acute distress HEENT:  EOMI, sclera anicteric Neck:     No masses; no thyromegaly Lungs:    Clear to auscultation bilaterally; normal respiratory effort CV:         Regular rate and rhythm; no murmurs Abd:      + bowel sounds; soft, non-tender; no palpable masses, no distension Ext:    No edema; adequate  peripheral perfusion Skin:      Warm and dry; no rash Neuro: alert and oriented x 3 Psych: normal mood and affect   Data Reviewed: Imaging: Chest x-ray 11/27/2018-chronic interstitial changes, left hemidiaphragm elevation, bibasilar ectasis Chest x-ray 01/24/2021-chronic interstitial changes and scattered nodularity High-resolution CT 08/29/2022-upper lobe reticular opacities with honeycomb changes, bilateral air-trapping, large hiatal hernia, aortic atherosclerosis. I reviewed images personally.  PFTs: 07/06/2016 FVC 3.19 [101%], FEV1 2.62 [100%], F/F 82, TLC 6.11 [101%], DLCO 16.38 [63%] Mild diffusion defect  09/04/2022 FVC 2.07 [59%], FEV1 1.68 [6 7%], F/F 81, TLC 4.58 [95%], DLCO 12.46 [57%] Mild restriction, moderate diffusion defect  Labs:  Assessment:  Evaluation for interstitial lung disease He has history of sarcoidosis which appears to be in remission since 2000 CT reviewed with chronic fibrotic changes secondary to sarcoid.  There does not appear to be any active inflammation.  Get follow-up CT in 1 year  Chronic cough, congestion Over-the-counter Mucinex.  PFTs do not show any obstruction.  He can stop the stiolto as he is not seeing any benefit to it.  Plan/Recommendations: CT follow-up in 1 year.  Chilton Greathouse MD [facility] 06/27/2023, 8:50 AM  CC: [name], MD

## 2023-07-11 DIAGNOSIS — Z23 Encounter for immunization: Secondary | ICD-10-CM | POA: Diagnosis not present

## 2023-08-16 DIAGNOSIS — Z23 Encounter for immunization: Secondary | ICD-10-CM | POA: Diagnosis not present

## 2023-09-04 ENCOUNTER — Other Ambulatory Visit: Payer: Self-pay | Admitting: Internal Medicine

## 2023-09-04 DIAGNOSIS — I10 Essential (primary) hypertension: Secondary | ICD-10-CM

## 2023-09-06 ENCOUNTER — Ambulatory Visit: Payer: Medicare Other | Attending: Internal Medicine

## 2023-09-06 ENCOUNTER — Encounter: Payer: Self-pay | Admitting: Internal Medicine

## 2023-09-06 ENCOUNTER — Ambulatory Visit: Payer: Medicare Other | Admitting: Internal Medicine

## 2023-09-06 VITALS — BP 134/92 | HR 52 | Temp 97.9°F | Resp 16 | Ht 68.0 in | Wt 173.8 lb

## 2023-09-06 DIAGNOSIS — G5622 Lesion of ulnar nerve, left upper limb: Secondary | ICD-10-CM

## 2023-09-06 DIAGNOSIS — D500 Iron deficiency anemia secondary to blood loss (chronic): Secondary | ICD-10-CM

## 2023-09-06 DIAGNOSIS — N401 Enlarged prostate with lower urinary tract symptoms: Secondary | ICD-10-CM | POA: Diagnosis not present

## 2023-09-06 DIAGNOSIS — I4819 Other persistent atrial fibrillation: Secondary | ICD-10-CM

## 2023-09-06 DIAGNOSIS — I10 Essential (primary) hypertension: Secondary | ICD-10-CM | POA: Diagnosis not present

## 2023-09-06 DIAGNOSIS — R001 Bradycardia, unspecified: Secondary | ICD-10-CM | POA: Diagnosis not present

## 2023-09-06 DIAGNOSIS — N138 Other obstructive and reflux uropathy: Secondary | ICD-10-CM | POA: Diagnosis not present

## 2023-09-06 DIAGNOSIS — N183 Chronic kidney disease, stage 3 unspecified: Secondary | ICD-10-CM

## 2023-09-06 LAB — CBC WITH DIFFERENTIAL/PLATELET
Basophils Absolute: 0 10*3/uL (ref 0.0–0.1)
Basophils Relative: 1 % (ref 0.0–3.0)
Eosinophils Absolute: 0.3 10*3/uL (ref 0.0–0.7)
Eosinophils Relative: 6 % — ABNORMAL HIGH (ref 0.0–5.0)
HCT: 46.1 % (ref 39.0–52.0)
Hemoglobin: 14.3 g/dL (ref 13.0–17.0)
Lymphocytes Relative: 26.5 % (ref 12.0–46.0)
Lymphs Abs: 1.1 10*3/uL (ref 0.7–4.0)
MCHC: 31.1 g/dL (ref 30.0–36.0)
MCV: 89.7 fL (ref 78.0–100.0)
Monocytes Absolute: 0.5 10*3/uL (ref 0.1–1.0)
Monocytes Relative: 10.7 % (ref 3.0–12.0)
Neutro Abs: 2.4 10*3/uL (ref 1.4–7.7)
Neutrophils Relative %: 55.8 % (ref 43.0–77.0)
Platelets: 162 10*3/uL (ref 150.0–400.0)
RBC: 5.14 Mil/uL (ref 4.22–5.81)
RDW: 15.8 % — ABNORMAL HIGH (ref 11.5–15.5)
WBC: 4.3 10*3/uL (ref 4.0–10.5)

## 2023-09-06 LAB — BASIC METABOLIC PANEL
BUN: 20 mg/dL (ref 6–23)
CO2: 32 meq/L (ref 19–32)
Calcium: 9.8 mg/dL (ref 8.4–10.5)
Chloride: 103 meq/L (ref 96–112)
Creatinine, Ser: 1.54 mg/dL — ABNORMAL HIGH (ref 0.40–1.50)
GFR: 43.7 mL/min — ABNORMAL LOW (ref >60.00)
Glucose, Bld: 89 mg/dL (ref 70–99)
Potassium: 4.3 meq/L (ref 3.5–5.1)
Sodium: 143 meq/L (ref 135–145)

## 2023-09-06 LAB — IBC + FERRITIN
Ferritin: 11.4 ng/mL — ABNORMAL LOW (ref 22.0–322.0)
Iron: 76 ug/dL (ref 42–165)
Saturation Ratios: 17.5 % — ABNORMAL LOW (ref 20.0–50.0)
TIBC: 434 ug/dL (ref 250.0–450.0)
Transferrin: 310 mg/dL (ref 212.0–360.0)

## 2023-09-06 LAB — TSH: TSH: 1.73 u[IU]/mL (ref 0.35–5.50)

## 2023-09-06 LAB — PSA: PSA: 4.17 ng/mL — ABNORMAL HIGH (ref 0.10–4.00)

## 2023-09-06 MED ORDER — TERAZOSIN HCL 2 MG PO CAPS
2.0000 mg | ORAL_CAPSULE | Freq: Every day | ORAL | 0 refills | Status: AC
Start: 2023-09-06 — End: ?

## 2023-09-06 NOTE — Progress Notes (Signed)
Subjective:  Patient ID: Richard Blake, male    DOB: Mar 15, 1946  Age: 77 y.o. MRN: [id number]  CC: Hypertension and Atrial Fibrillation   HPI RUSBEL IWEN presents for f/up ----  Discussed the use of AI scribe software for clinical note transcription with the patient, who gave verbal consent to proceed.  History of Present Illness   The patient, with a history of atrial fibrillation and hypertension, presents with worsening pain in the arm, specifically from the elbow down to the wrist. The pain, which started approximately six to seven weeks ago, is not throbbing but has been causing discomfort. The patient noted difficulty in raising the arm, although the range of motion has improved recently. The pain is suspected to be related to an ulnar nerve issue, as previously discussed with another physician. The patient is considering a follow-up consultation for further evaluation and potential intervention.  In addition to the arm pain, the patient reported episodes of blood pressure spikes over the past week. These episodes, which occurred in the mornings, were not associated with any symptoms such as palpitations, chest pain, shortness of breath, dizziness, or lightheadedness. The patient managed these episodes by taking an extra dose of Losartan, which seemed to normalize the blood pressure. The patient speculated that the blood pressure spikes might be related to the pain from the ulnar nerve issue.       Outpatient Medications Prior to Visit  Medication Sig Dispense Refill   acetaminophen (TYLENOL) 500 MG tablet Take 500-1,000 mg by mouth every 6 (six) hours as needed (for pain from muscle spasms).     allopurinol (ZYLOPRIM) 100 MG tablet TAKE 2 TABLETS(200 MG) BY MOUTH DAILY 180 tablet 0   Cholecalciferol 50 MCG (2000 UT) TABS Take 1 tablet (2,000 Units total) by mouth daily. (Patient taking differently: Take 1 tablet by mouth as needed.) 90 tablet 1   Dietary Management Product  (FOSTEUM PLUS) CAPS Take 1 capsule by mouth 2 (two) times daily. (Patient taking differently: Take 1 capsule by mouth as needed.) 60 capsule 11   ferrous sulfate 325 (65 FE) MG tablet Take 1 tablet (325 mg total) by mouth 3 (three) times daily with meals. (Patient taking differently: Take 325 mg by mouth as needed.) 90 tablet 2   finasteride (PROSCAR) 5 MG tablet Take 1 tablet (5 mg total) by mouth daily. (Patient taking differently: Take 5 mg by mouth daily. Couple times a week per pt) 90 tablet 1   losartan (COZAAR) 100 MG tablet TAKE 1 TABLET(100 MG) BY MOUTH DAILY 90 tablet 3   metoprolol tartrate (LOPRESSOR) 25 MG tablet TAKE 1 TABLET(25 MG) BY MOUTH TWICE DAILY 180 tablet 0   pantoprazole (PROTONIX) 40 MG tablet TAKE 1 TABLET(40 MG) BY MOUTH DAILY (Patient taking differently: Take 40 mg by mouth as needed.) 90 tablet 1   Rivaroxaban (XARELTO) 15 MG TABS tablet Take 1 tablet (15 mg total) by mouth daily with supper. 90 tablet 1   rosuvastatin (CRESTOR) 10 MG tablet Take 1 tablet (10 mg total) by mouth daily. (Patient taking differently: Take 10 mg by mouth daily. As needed per pt) 90 tablet 1   SYSTANE ULTRA 0.4-0.3 % SOLN SMARTSIG:1 Drop(s) In Eye(s) As Needed     torsemide (DEMADEX) 20 MG tablet TAKE 1 TABLET(20 MG) BY MOUTH DAILY 90 tablet 0   vitamin B-12 (CYANOCOBALAMIN) 250 MCG tablet Take 250 mcg by mouth daily.     STIOLTO RESPIMAT 2.5-2.5 MCG/ACT AERS INHALE 2 PUFFS INTO  THE LUNGS DAILY (Patient not taking: Reported on 09/06/2023) 12 g 1   No facility-administered medications prior to visit.    ROS Review of Systems  Constitutional:  Negative for appetite change, chills, diaphoresis and fatigue.  HENT: Negative.    Eyes: Negative.   Respiratory:  Negative for chest tightness, shortness of breath and wheezing.   Cardiovascular:  Negative for chest pain, palpitations and leg swelling.  Gastrointestinal:  Negative for abdominal pain, blood in stool, diarrhea, nausea and vomiting.   Endocrine: Negative.   Genitourinary: Negative.  Negative for difficulty urinating.  Musculoskeletal:  Positive for gait problem.       Frequent falls with injuries  Skin: Negative.   Neurological:  Positive for syncope. Negative for dizziness, weakness and light-headedness.  Hematological: Negative.   Psychiatric/Behavioral:  Positive for confusion and decreased concentration. Negative for dysphoric mood. The patient is not nervous/anxious.     Objective:  BP (!) 134/92 (BP Location: Left Arm, Patient Position: Sitting, Cuff Size: Normal)   Pulse (!) 52   Temp 97.9 F (36.6 C) (Oral)   Resp 16   Ht 5\' 8"  (1.727 m)   Wt 173 lb 12.8 oz (78.8 kg)   SpO2 98%   BMI 26.43 kg/m   BP Readings from Last 3 Encounters:  09/06/23 (!) 134/92  06/27/23 122/82  05/03/23 (!) 171/110    Wt Readings from Last 3 Encounters:  09/06/23 173 lb 12.8 oz (78.8 kg)  06/27/23 173 lb (78.5 kg)  05/03/23 177 lb (80.3 kg)    Physical Exam Vitals reviewed.  Constitutional:      Appearance: He is not ill-appearing.  HENT:     Mouth/Throat:     Mouth: Mucous membranes are moist.  Eyes:     General: No scleral icterus.    Conjunctiva/sclera: Conjunctivae normal.  Cardiovascular:     Rate and Rhythm: Bradycardia present. Rhythm irregularly irregular.     Pulses: Normal pulses.     Heart sounds: No murmur heard.    No friction rub. No gallop.     Comments: EKG- A fib with slow VR, 57 bpm Incomplete RBBB - new No LVH, Q waves, or ST/T waves  Pulmonary:     Effort: Pulmonary effort is normal.     Breath sounds: No stridor. No wheezing, rhonchi or rales.  Abdominal:     General: Abdomen is flat.     Palpations: There is no mass.     Tenderness: There is no abdominal tenderness. There is no guarding.     Hernia: No hernia is present.  Musculoskeletal:        General: Normal range of motion.     Cervical back: Neck supple.     Right lower leg: No edema.     Left lower leg: No edema.   Skin:    General: Skin is warm and dry.  Neurological:     Mental Status: He is alert. Mental status is at baseline.  Psychiatric:        Mood and Affect: Mood normal.        Behavior: Behavior normal.     Lab Results  Component Value Date   WBC 4.3 09/06/2023   HGB 14.3 09/06/2023   HCT 46.1 09/06/2023   PLT 162.0 09/06/2023   GLUCOSE 89 09/06/2023   CHOL 185 12/27/2022   TRIG 116.0 12/27/2022   HDL 52.80 12/27/2022   LDLDIRECT 186.5 10/23/2012   LDLCALC 109 (H) 12/27/2022   ALT 15 04/22/2018  AST 15 04/22/2018   NA 143 09/06/2023   K 4.3 09/06/2023   CL 103 09/06/2023   CREATININE 1.54 (H) 09/06/2023   BUN 20 09/06/2023   CO2 32 09/06/2023   TSH 1.73 09/06/2023   PSA 4.17 (H) 09/06/2023   INR 1.1 12/14/2016    DG Bone Density  Result Date: 06/26/2023 Table formatting from the original result was not included. Date of study: 06/26/2023 Exam: DUAL X-RAY ABSORPTIOMETRY (DXA) FOR BONE MINERAL DENSITY (BMD) Instrument: [employer] Requesting Provider: PCP Indication: follow up for osteoporosis Comparison: 02/23/2017 Clinical data: Pt is a 77 y.o. male with previous history of distal radius fracture. On calcium and vitamin D. Results:  Lumbar spine L1- L2 (L3, L4) Femoral neck (FN) 33% distal radius T-score -1.4 RFN: -2.0 LFN: -1.7 n/a Change in BMD from previous DXA test (%) -2.4% +0.5% n/a (*) statistically significant Assessment: the BMD is low according to the [facility] classification for osteoporosis (see below). Fracture risk: moderate FRAX score: 10 year major osteoporotic risk: 6.1%. 10-year hip fracture risk: 2.1%. The thresholds for treatment are 20% and 3%, respectively. Comments: the technical quality of the study is good, however, L3 and L4 vertebrae had to be excluded from analysis due to degenerative changes. Evaluation for secondary causes should be considered if clinically indicated. Recommend optimizing calcium (1200 mg/day) and vitamin D (800 IU/day) intake.  Followup: Repeat BMD is appropriate after 2 years WHO criteria for diagnosis of osteoporosis in postmenopausal women and in men 72 y/o or older: - normal: T-score -1.0 to + 1.0 - osteopenia/low bone density: T-score between -2.5 and -1.0 - osteoporosis: T-score below -2.5 - severe osteoporosis: T-score below -2.5 with history of fragility fracture Note: although not part of the WHO classification, the presence of a fragility fracture, regardless of the T-score, should be considered diagnostic of osteoporosis, provided other causes for the fracture have been excluded. Treatment: The National Osteoporosis Foundation recommends that treatment be considered in postmenopausal women and men age 42 or older with: 1. Hip or vertebral (clinical or morphometric) fracture 2. T-score of - 2.5 or lower at the spine or hip 3. 10-year fracture probability by FRAX of at least 20% for a major osteoporotic fracture and 3% for a hip fracture Carlus Pavlov, MD [facility]    Assessment & Plan:   Persistent atrial fibrillation- Will evaluate with a cardiac monitor. -     EKG 12-Lead -     LONG TERM MONITOR (3-14 DAYS); Future -     TSH; Future  BPH with obstruction/lower urinary tract symptoms -     PSA; Future  Bradycardia- Will evaluate for pauses and other causes of syncope. -     LONG TERM MONITOR (3-14 DAYS); Future -     TSH; Future  Essential hypertension- BP is not at goal. -     Basic metabolic panel; Future -     Terazosin HCl; Take 1 capsule (2 mg total) by mouth at bedtime.  Dispense: 90 capsule; Refill: 0 -     AMB Referral [facility] Care Management  Iron deficiency anemia due to chronic blood loss- H/H are normal. -     CBC with Differential/Platelet; Future -     IBC + Ferritin; Future  Ulnar neuropathy of left upper extremity -     Ambulatory referral to Orthopedic Surgery  CRI (chronic renal insufficiency), stage 3 (moderate)     Follow-up: Return in about 3 months (around  12/07/2023).  Sanda Linger, MD

## 2023-09-06 NOTE — Patient Instructions (Signed)
Bradycardia, Adult Bradycardia is a slower-than-normal heartbeat. A normal resting heart rate for an adult ranges from 60 to 100 beats per minute. With bradycardia, the resting heart rate is less than 60 beats per minute. Bradycardia can prevent enough oxygen from reaching certain areas of your body when you are active. It can be serious if it keeps enough oxygen from reaching your brain and other parts of your body. Bradycardia is not a problem for everyone. For some healthy adults, a slow resting heart rate is normal. What are the causes? This condition may be caused by: A problem with the heart, including: A problem with the heart's electrical system, such as a heart block. With a heart block, electrical signals between the chambers of the heart are partially or completely blocked, so they are not able to work as they should. A problem with the heart's natural pacemaker (sinus node). Heart disease. A heart attack. Heart damage. Lyme disease. A heart infection. A heart condition that is present at birth (congenital heart defect). Certain medicines that treat heart conditions. Certain conditions, such as hypothyroidism and obstructive sleep apnea. Problems with the balance of chemicals and other substances, like potassium, in the blood. Trauma. Radiation therapy. What increases the risk? You are more likely to develop this condition if you: Are age 65 or older. Have high blood pressure (hypertension), high cholesterol (hyperlipidemia), or diabetes. Drink heavily, use tobacco or nicotine products, or use drugs. What are the signs or symptoms? Symptoms of this condition include: Light-headedness. Feeling faint or fainting. Fatigue and weakness. Trouble with activity or exercise. Shortness of breath. Chest pain (angina). Drowsiness. Confusion. Dizziness. How is this diagnosed? This condition may be diagnosed based on: Your symptoms. Your medical history. A physical exam. During  the exam, your health care provider will listen to your heartbeat and check your pulse. To confirm the diagnosis, your health care provider may order tests, such as: Blood tests. An electrocardiogram (ECG). This test records the heart's electrical activity. The test can show how fast your heart is beating and whether the heartbeat is steady. A test in which you wear a portable device (event recorder or Holter monitor) to record your heart's electrical activity while you go about your day. An exercise test. How is this treated? Treatment for this condition depends on the cause of the condition and how severe your symptoms are. Treatment may involve: Treatment of the underlying condition. Changing your medicines or how much medicine you take. Having a small, battery-operated device called a pacemaker implanted under the skin. When bradycardia occurs, this device can be used to increase your heart rate and help your heart beat in a regular rhythm. Follow these instructions at home: Lifestyle Manage any health conditions that contribute to bradycardia as told by your health care provider. Follow a heart-healthy diet. A nutrition specialist (dietitian) can help educate you about healthy food options and changes. Follow an exercise program that is approved by your health care provider. Maintain a healthy weight. Try to reduce or manage your stress, such as with yoga or meditation. If you need help reducing stress, ask your health care provider. Do not use any products that contain nicotine or tobacco. These products include cigarettes, chewing tobacco, and vaping devices, such as e-cigarettes. If you need help quitting, ask your health care provider. Do not use illegal drugs. Alcohol use If you drink alcohol: Limit how much you have to: 0-1 drink a day for women who are not pregnant. 0-2 drinks a day   for men. Know how much alcohol is in a drink. In the [place] one drink equals one 12 oz bottle of  beer (355 mL), one 5 oz glass of wine (148 mL), or one 1 oz glass of hard liquor (44 mL). General instructions Take over-the-counter and prescription medicines only as told by your health care provider. Keep all follow-up visits. This is important. How is this prevented? In some cases, bradycardia may be prevented by: Treating underlying medical problems. Stopping behaviors or medicines that can trigger the condition. Contact a health care provider if: You feel light-headed or dizzy. You almost faint. You feel weak or are easily fatigued during physical activity. You experience confusion or have memory problems. Get help right away if: You faint. You have chest pains or an irregular heartbeat (palpitations). You have trouble breathing. These symptoms may represent a serious problem that is an emergency. Do not wait to see if the symptoms will go away. Get medical help right away. Call your local emergency services (911 in the [place] Do not drive yourself to the hospital. Summary Bradycardia is a slower-than-normal heartbeat. With bradycardia, the resting heart rate is less than 60 beats per minute. Treatment for this condition depends on the cause. Manage any health conditions that contribute to bradycardia as told by your health care provider. Do not use any products that contain nicotine or tobacco. These products include cigarettes, chewing tobacco, and vaping devices, such as e-cigarettes. Keep all follow-up visits. This is important. This information is not intended to replace advice given to you by your health care provider. Make sure you discuss any questions you have with your health care provider. Document Revised: 02/20/2021 Document Reviewed: 02/20/2021 Elsevier Patient Education  2024 [employer].  

## 2023-09-06 NOTE — Progress Notes (Unsigned)
EP to read

## 2023-09-07 ENCOUNTER — Telehealth: Payer: Self-pay

## 2023-09-07 NOTE — Progress Notes (Signed)
Care Guide Note  09/07/2023 Name: BERKLEY MAYBIN MRN: [id number] DOB: 08-Dec-1945  Referred by: [name], MD Reason for referral : Care Coordination (Outreach to schedule with pharm d )   LAMONTAY LEVAR is a 77 y.o. year old male who is a primary care patient of [name], MD. NAMEER HAGLE was referred to the pharmacist for assistance related to HTN and HLD.    Successful contact was made with the patient to discuss pharmacy services including being ready for the pharmacist to call at least 5 minutes before the scheduled appointment time, to have medication bottles and any blood sugar or blood pressure readings ready for review. The patient agreed to meet with the pharmacist via with the pharmacist via telephone visit on (date/time).  09/17/2023  Penne Lash, RMA Care Guide [facility]  [place], [place] [address] Direct Dial: [phone] [email or web address]

## 2023-09-11 DIAGNOSIS — I4819 Other persistent atrial fibrillation: Secondary | ICD-10-CM

## 2023-09-11 DIAGNOSIS — R001 Bradycardia, unspecified: Secondary | ICD-10-CM

## 2023-09-17 ENCOUNTER — Other Ambulatory Visit: Payer: Medicare Other

## 2023-09-17 NOTE — Progress Notes (Signed)
09/25/2023 Name: Richard Blake MRN: [id number] DOB: 1946/03/21  Chief Complaint  Patient presents with   Hypertension   Medication Management    Richard Blake is a 77 y.o. year old male who presented for a telephone visit.   They were referred to the pharmacist by their PCP for assistance in managing hypertension.   Subjective:  Care Team: Primary Care Provider: [name], MD ; Next Scheduled Visit: none scheduled  Medication Access/Adherence  Current Pharmacy:  [id number] [place], [place] - [address] AT [place] [address] [place] [address] Phone: [phone] Fax: [phone]  [facility] - [place], [place] - [address] [place] [address] Phone: [phone] Fax: [phone]  [facility] #[id number] [place], [place] - [address] AT [facility] [address] [place] [address] Phone: [phone] Fax: [phone]   Patient reports affordability concerns with their medications: No  Patient reports access/transportation concerns to their pharmacy: No  Patient reports adherence concerns with their medications:  Yes    Pt reports he takes medications only sometimes as he feels he has too many medications and does not like to take them together. He tends to prioritize certain medications over others and will take certain meds only some days depending on how he is feeling.   Hypertension:  Current medications: losartan 100 mg daily, metoprolol tartrate 25 mg twice daily, terazosin 2 mg at bedtime (started 11/1) Medications previously tried:   Patient has a validated, automated, upper arm home BP cuff Current blood pressure readings readings: 10/26 116/79 (58), 10/29 111/71 (65), 10/30 118/71 (52), 11/2 116/72 (48), 11/4 129/71 (41)  Patient denies hypotensive s/sx including dizziness, lightheadedness.    Objective:  BP Readings from Last 3  Encounters:  09/06/23 (!) 134/92  06/27/23 122/82  05/03/23 (!) 171/110     No results found for: "HGBA1C"  Lab Results  Component Value Date   CREATININE 1.54 (H) 09/06/2023   BUN 20 09/06/2023   NA 143 09/06/2023   K 4.3 09/06/2023   CL 103 09/06/2023   CO2 32 09/06/2023    Lab Results  Component Value Date   CHOL 185 12/27/2022   HDL 52.80 12/27/2022   LDLCALC 109 (H) 12/27/2022   LDLDIRECT 186.5 10/23/2012   TRIG 116.0 12/27/2022   CHOLHDL 4 12/27/2022    Medications Reviewed Today     Reviewed by Bonita Quin, RPH (Pharmacist) on 09/25/23 at 1537  Med List Status: <None>   Medication Order Taking? Sig Documenting Provider Last Dose Status Informant  acetaminophen (TYLENOL) 500 MG tablet [id number]  Take 500-1,000 mg by mouth every 6 (six) hours as needed (for pain from muscle spasms). [provider]  Active Self           Med Note Willa Rough, Kathreen Cornfield Apr 11, 2023  9:59 AM)    allopurinol (ZYLOPRIM) 100 MG tablet [id number]  TAKE 2 TABLETS(200 MG) BY MOUTH DAILY [name], MD  Active   Cholecalciferol 50 MCG (2000 UT) TABS [id number]  Take 1 tablet (2,000 Units total) by mouth daily.  Patient taking differently: Take 1 tablet by mouth as needed.   [name], MD  Active   Dietary Management Product (FOSTEUM PLUS) CAPS [id number]  Take 1 capsule by mouth 2 (two) times daily.  Patient taking differently: Take 1 capsule by mouth as needed.   [name], MD  Active  Med Note (HICKS, APRIL S   Wed Apr 11, 2023 10:00 AM)    ferrous sulfate 325 (65 FE) MG tablet [id number]  Take 1 tablet (325 mg total) by mouth 3 (three) times daily with meals.  Patient taking differently: Take 325 mg by mouth as needed.   [name], MD  Active   finasteride (PROSCAR) 5 MG tablet [id number]  Take 1 tablet (5 mg total) by mouth daily.  Patient taking differently: Take 5 mg by mouth daily. Couple times a week per pt   [name], MD   Active   losartan (COZAAR) 100 MG tablet [id number] Yes TAKE 1 TABLET(100 MG) BY MOUTH DAILY Marinus Maw, MD Taking Active   metoprolol tartrate (LOPRESSOR) 25 MG tablet [id number] Yes TAKE 1 TABLET(25 MG) BY MOUTH TWICE DAILY [name], MD Taking Active   pantoprazole (PROTONIX) 40 MG tablet [id number]  TAKE 1 TABLET(40 MG) BY MOUTH DAILY  Patient taking differently: Take 40 mg by mouth as needed.   [name], MD  Active            Med Note Daryll Brod   Thu Sep 06, 2023 10:08 AM)    Rivaroxaban (XARELTO) 15 MG TABS tablet [id number]  Take 1 tablet (15 mg total) by mouth daily with supper. [name], MD  Active   rosuvastatin (CRESTOR) 10 MG tablet [id number]  Take 1 tablet (10 mg total) by mouth daily.  Patient taking differently: Take 10 mg by mouth daily. As needed per pt   [name], MD  Active   STIOLTO RESPIMAT 2.5-2.5 MCG/ACT AERS [id number]  INHALE 2 PUFFS INTO THE LUNGS DAILY  Patient not taking: Reported on 09/06/2023   [name], MD  Active            Med Note Littie Deeds, [place] A   Fri Dec 22, 2022  2:29 PM) States told not necessary to continue taking  SYSTANE ULTRA 0.4-0.3 % SOLN [id number]  SMARTSIG:1 Drop(s) In Eye(s) As Needed [provider]  Active            Med Note Littie Deeds, CHERYL A   Fri Dec 22, 2022  2:31 PM) Only as needed  terazosin (HYTRIN) 2 MG capsule [id number] Yes Take 1 capsule (2 mg total) by mouth at bedtime. [name], MD Taking Active   torsemide (DEMADEX) 20 MG tablet [id number] Yes TAKE 1 TABLET(20 MG) BY MOUTH DAILY [name], MD Taking Active   vitamin B-12 (CYANOCOBALAMIN) 250 MCG tablet [id number]  Take 250 mcg by mouth daily. [provider]  Active Self           Med Note Littie Deeds, CHERYL A   Fri Dec 22, 2022  2:31 PM) Takes twice a week              Assessment/Plan:   Hypertension: - Currently controlled, BP goal <130/80 - Reviewed long term cardiovascular and renal outcomes  of uncontrolled blood pressure - Reviewed importance of adherence to medications as prescribed and attempted to review the purpose of his medications and the safety of taking multiple medications at a time. - Recommend to continue current regimen     Follow Up Plan: PRN  Arbutus Leas, PharmD, BCPS Clinical Pharmacist  Primary Care at [facility] [phone]

## 2023-09-28 ENCOUNTER — Telehealth: Payer: Self-pay | Admitting: Internal Medicine

## 2023-09-28 DIAGNOSIS — G5622 Lesion of ulnar nerve, left upper limb: Secondary | ICD-10-CM | POA: Diagnosis not present

## 2023-09-28 DIAGNOSIS — I4819 Other persistent atrial fibrillation: Secondary | ICD-10-CM | POA: Diagnosis not present

## 2023-09-28 DIAGNOSIS — R001 Bradycardia, unspecified: Secondary | ICD-10-CM | POA: Diagnosis not present

## 2023-09-28 NOTE — Telephone Encounter (Signed)
   Cardiac Monitor Alert  Date of alert:  09/28/2023   Patient Name: Richard Blake  DOB: 1945/12/11  MRN: [id number]   [facility] Cardiologist: Lewayne Bunting, MD  [facility] EP:  Lewayne Bunting, MD    Monitor Information: Long Term Monitor [ZioXT]  Reason:  other persistent atrial Ordering provider:  Sanda Linger, MD   Alert Atrial Fibrillation/Flutter This is the 1st alert for this rhythm.  The patient has a hx of Atrial Fibrillation/Flutter.  The patient is currently on anticoagulation.  Anticoagulation medication as of 09/28/2023           Rivaroxaban (XARELTO) 15 MG TABS tablet Take 1 tablet (15 mg total) by mouth daily with supper.       Next Cardiology Appointment   Date: None currently    Other: Ordered by PCP. Dr Ladona Ridgel to review. Was also reviewed today and signed by (DOD) Dr Elease Hashimoto who is deferring to Dr Ladona Ridgel.  Richelle Ito, RN  09/28/2023 1:54 PM

## 2023-09-28 NOTE — Telephone Encounter (Signed)
Richard Blake with rhythm calling to report urgent afib episode

## 2023-09-29 ENCOUNTER — Other Ambulatory Visit: Payer: Self-pay | Admitting: Internal Medicine

## 2023-09-29 DIAGNOSIS — R001 Bradycardia, unspecified: Secondary | ICD-10-CM | POA: Insufficient documentation

## 2023-10-05 NOTE — Telephone Encounter (Signed)
Afib is not new. Continue xarelto and beta blocker.

## 2023-10-15 NOTE — Telephone Encounter (Signed)
Possible repeat note: Ordering PCP reviewed.

## 2023-11-08 NOTE — Progress Notes (Signed)
  Electrophysiology Office Note:   Date:  11/12/2023  ID:  Richard Blake, DOB [birth year], MRN [id number]  Primary Cardiologist: Richard Bunting, MD Electrophysiologist: Richard Bunting, MD      History of Present Illness:   Richard Blake is a 77 y.o. male with h/o permanent AF, HT, and Chronic diastolic CHF seen today for routine electrophysiology followup.   Wore monitor in October by his PCP which showed AF (known) and bradycardia at times, in the setting of 3 pauses (longest 3 seconds).   Since last being seen in our clinic the patient reports doing well overall. Denies any symptoms of lightheadedness or dizziness. He denies chest pain, palpitations, dyspnea, PND, orthopnea, nausea, vomiting, syncope, edema, weight gain, or early satiety.   Review of systems complete and found to be negative unless listed in HPI.   EP Information / Studies Reviewed:    EKG is ordered today. Personal review as below.  EKG Interpretation Date/Time:  Monday November 12 2023 11:55:59 EST Ventricular Rate:  48 PR Interval:    QRS Duration:  94 QT Interval:  426 QTC Calculation: 380 R Axis:   40  Text Interpretation: Atrial fibrillation with slow ventricular response When compared with ECG of 31-Jan-2017 23:52, Vent. rate has decreased BY  27 BPM ST no longer depressed in Anterior leads Nonspecific T wave abnormality has replaced inverted T waves in Inferior leads T wave inversion no longer evident in Anterior leads QT has shortened Confirmed by Richard Blake [id number]) on 11/12/2023 12:00:59 PM    Physical Exam:   VS:  There were no vitals taken for this visit.   Wt Readings from Last 3 Encounters:  09/06/23 173 lb 12.8 oz (78.8 kg)  06/27/23 173 lb (78.5 kg)  05/03/23 177 lb (80.3 kg)     GEN: Well nourished, well developed in no acute distress NECK: No JVD; No carotid bruits CARDIAC: Irregularly irregular rate and rhythm, no murmurs, rubs, gallops RESPIRATORY:  Clear to auscultation without  rales, wheezing or rhonchi  ABDOMEN: Soft, non-tender, non-distended EXTREMITIES:  No edema; No deformity   ASSESSMENT AND PLAN:    Permanent atrial fibrillation Rates controlled overall.  Continue lopressor 25 mg daily Recent labs (08/2023) stable  Pauses Asymptomatic bradycardia Short pauses noted on monitor.  No symptoms No sustained bradycardia. Continue BB for the time being. Can decrease if needed   HTN Stable on current regimen    Chronic diastolic CHF Echo 50-55% 2018 Encouraged limiting fluid and salt intake to help with need for torsemide use.   Follow up with EP APP in 6 months  Signed, Richard Freer, PA-C

## 2023-11-12 ENCOUNTER — Ambulatory Visit: Payer: Medicare Other | Attending: Student | Admitting: Student

## 2023-11-12 ENCOUNTER — Encounter: Payer: Self-pay | Admitting: Student

## 2023-11-12 VITALS — BP 134/64 | HR 48 | Ht 66.0 in | Wt 175.6 lb

## 2023-11-12 DIAGNOSIS — I10 Essential (primary) hypertension: Secondary | ICD-10-CM

## 2023-11-12 DIAGNOSIS — I4819 Other persistent atrial fibrillation: Secondary | ICD-10-CM | POA: Diagnosis not present

## 2023-11-12 DIAGNOSIS — I5032 Chronic diastolic (congestive) heart failure: Secondary | ICD-10-CM | POA: Diagnosis not present

## 2023-11-12 DIAGNOSIS — R001 Bradycardia, unspecified: Secondary | ICD-10-CM

## 2023-11-12 NOTE — Patient Instructions (Signed)
Medication Instructions:  Your physician recommends that you continue on your current medications as directed. Please refer to the Current Medication list given to you today.  *If you need a refill on your cardiac medications before your next appointment, please call your pharmacy*  Lab Work: If you have labs (blood work) drawn today and your tests are completely normal, you will receive your results only by: MyChart Message (if you have MyChart) OR A paper copy in the mail If you have any lab test that is abnormal or we need to change your treatment, we will call you to review the results.  Follow-Up: At [facility], you and your health needs are our priority.  As part of our continuing mission to provide you with exceptional heart care, we have created designated Provider Care Teams.  These Care Teams include your primary Cardiologist (physician) and Advanced Practice Providers (APPs -  Physician Assistants and Nurse Practitioners) who all work together to provide you with the care you need, when you need it.  We recommend signing up for the patient portal called "MyChart".  Sign up information is provided on this After Visit Summary.  MyChart is used to connect with patients for Virtual Visits (Telemedicine).  Patients are able to view lab/test results, encounter notes, upcoming appointments, etc.  Non-urgent messages can be sent to your provider as well.   To learn more about what you can do with MyChart, go to [email or web address].    Your next appointment:   6 month(s)  Provider:   You will see one of the following Advanced Practice Providers on your designated Care Team:   Casimiro Needle "Mardelle Matte" [place], [place]

## 2023-12-11 ENCOUNTER — Other Ambulatory Visit: Payer: Self-pay | Admitting: Internal Medicine

## 2023-12-11 DIAGNOSIS — I10 Essential (primary) hypertension: Secondary | ICD-10-CM

## 2023-12-11 DIAGNOSIS — I4819 Other persistent atrial fibrillation: Secondary | ICD-10-CM

## 2023-12-11 DIAGNOSIS — M1A39X0 Chronic gout due to renal impairment, multiple sites, without tophus (tophi): Secondary | ICD-10-CM

## 2023-12-12 ENCOUNTER — Other Ambulatory Visit: Payer: Self-pay | Admitting: Internal Medicine

## 2023-12-12 DIAGNOSIS — I10 Essential (primary) hypertension: Secondary | ICD-10-CM

## 2024-01-10 DIAGNOSIS — I129 Hypertensive chronic kidney disease with stage 1 through stage 4 chronic kidney disease, or unspecified chronic kidney disease: Secondary | ICD-10-CM | POA: Diagnosis not present

## 2024-01-10 DIAGNOSIS — D509 Iron deficiency anemia, unspecified: Secondary | ICD-10-CM | POA: Diagnosis not present

## 2024-01-10 DIAGNOSIS — I480 Paroxysmal atrial fibrillation: Secondary | ICD-10-CM | POA: Diagnosis not present

## 2024-01-10 DIAGNOSIS — N183 Chronic kidney disease, stage 3 unspecified: Secondary | ICD-10-CM | POA: Diagnosis not present

## 2024-01-10 DIAGNOSIS — M109 Gout, unspecified: Secondary | ICD-10-CM | POA: Diagnosis not present

## 2024-01-11 LAB — LAB REPORT - SCANNED
Creatinine, POC: 159.8 mg/dL
EGFR: 42

## 2024-01-16 ENCOUNTER — Encounter: Payer: Self-pay | Admitting: Nephrology

## 2024-01-24 DIAGNOSIS — M9903 Segmental and somatic dysfunction of lumbar region: Secondary | ICD-10-CM | POA: Diagnosis not present

## 2024-01-24 DIAGNOSIS — M9904 Segmental and somatic dysfunction of sacral region: Secondary | ICD-10-CM | POA: Diagnosis not present

## 2024-01-24 DIAGNOSIS — M9905 Segmental and somatic dysfunction of pelvic region: Secondary | ICD-10-CM | POA: Diagnosis not present

## 2024-01-24 DIAGNOSIS — M5136 Other intervertebral disc degeneration, lumbar region with discogenic back pain only: Secondary | ICD-10-CM | POA: Diagnosis not present

## 2024-02-04 DIAGNOSIS — H43813 Vitreous degeneration, bilateral: Secondary | ICD-10-CM | POA: Diagnosis not present

## 2024-03-19 ENCOUNTER — Ambulatory Visit: Payer: Medicare Other

## 2024-03-19 VITALS — BP 117/82 | HR 52 | Ht 66.0 in | Wt 175.5 lb

## 2024-03-19 DIAGNOSIS — Z0000 Encounter for general adult medical examination without abnormal findings: Secondary | ICD-10-CM | POA: Diagnosis not present

## 2024-03-19 NOTE — Progress Notes (Signed)
 Subjective:   Richard Blake is a 78 y.o. who presents for a Medicare Wellness preventive visit.  Visit Complete: Virtual I connected with  Jessee Mormon on 03/19/24 by a audio enabled telemedicine application and verified that I am speaking with the correct person using two identifiers.  Patient Location: Home  Provider Location: Office/Clinic  I discussed the limitations of evaluation and management by telemedicine. The patient expressed understanding and agreed to proceed.  Vital Signs: Because this visit was a virtual/telehealth visit, some criteria may be missing or patient reported. Any vitals not documented were not able to be obtained and vitals that have been documented are patient reported.  VideoDeclined- This patient declined Librarian, academic. Therefore the visit was completed with audio only.  Persons Participating in Visit: Patient.  AWV Questionnaire: Yes: Patient Medicare AWV questionnaire was completed by the patient on 03/14/2024; I have confirmed that all information answered by patient is correct and no changes since this date.  Cardiac Risk Factors include: advanced age (>35men, >40 women);dyslipidemia;hypertension;male gender     Objective:    Today's Vitals   03/19/24 1543  BP: 117/82  Pulse: (!) 52  Weight: 175 lb 8 oz (79.6 kg)  Height: 5\' 6"  (1.676 m)   Body mass index is 28.33 kg/m.     03/19/2024    3:41 PM 03/19/2023    3:17 PM 02/21/2022    2:49 PM 02/09/2021    2:12 PM 09/23/2019   11:23 AM 09/17/2018   11:52 AM 09/13/2017    2:35 PM  Advanced Directives  Does Patient Have a Medical Advance Directive? No No No No No No No  Would patient like information on creating a medical advance directive? Yes (MAU/Ambulatory/Procedural Areas - Information given) No - Patient declined No - Patient declined No - Patient declined No - Patient declined Yes (ED - Information included in AVS) Yes (ED - Information included in AVS)     Current Medications (verified) Outpatient Encounter Medications as of 03/19/2024  Medication Sig   acetaminophen (TYLENOL) 500 MG tablet Take 500-1,000 mg by mouth every 6 (six) hours as needed (for pain from muscle spasms).   allopurinol  (ZYLOPRIM ) 100 MG tablet TAKE 2 TABLETS(200 MG) BY MOUTH DAILY   Cholecalciferol  50 MCG (2000 UT) TABS Take 1 tablet (2,000 Units total) by mouth daily. (Patient taking differently: Take 1 tablet by mouth as needed.)   Dietary Management Product (FOSTEUM PLUS) CAPS Take 1 capsule by mouth 2 (two) times daily. (Patient taking differently: Take 1 capsule by mouth as needed.)   ferrous sulfate  325 (65 FE) MG tablet Take 1 tablet (325 mg total) by mouth 3 (three) times daily with meals. (Patient taking differently: Take 325 mg by mouth as needed.)   finasteride  (PROSCAR ) 5 MG tablet Take 1 tablet (5 mg total) by mouth daily. (Patient taking differently: Take 5 mg by mouth daily. Couple times a week per pt)   losartan  (COZAAR ) 100 MG tablet TAKE 1 TABLET(100 MG) BY MOUTH DAILY   metoprolol  tartrate (LOPRESSOR ) 25 MG tablet TAKE 1 TABLET(25 MG) BY MOUTH TWICE DAILY   pantoprazole  (PROTONIX ) 40 MG tablet TAKE 1 TABLET(40 MG) BY MOUTH DAILY (Patient taking differently: Take 40 mg by mouth as needed.)   Rivaroxaban  (XARELTO ) 15 MG TABS tablet Take 1 tablet (15 mg total) by mouth daily with supper.   rosuvastatin  (CRESTOR ) 10 MG tablet Take 1 tablet (10 mg total) by mouth daily. (Patient taking differently: Take 10 mg  by mouth daily. As needed per pt)   STIOLTO RESPIMAT  2.5-2.5 MCG/ACT AERS INHALE 2 PUFFS INTO THE LUNGS DAILY   SYSTANE ULTRA 0.4-0.3 % SOLN SMARTSIG:1 Drop(s) In Eye(s) As Needed   terazosin  (HYTRIN ) 2 MG capsule Take 1 capsule (2 mg total) by mouth at bedtime.   torsemide  (DEMADEX ) 20 MG tablet TAKE 1 TABLET(20 MG) BY MOUTH DAILY   vitamin B-12 (CYANOCOBALAMIN ) 250 MCG tablet Take 250 mcg by mouth daily.   No facility-administered encounter medications  on file as of 03/19/2024.    Allergies (verified) Amlodipine , Welchol  [colesevelam  hcl], Other, and Prolia  [denosumab ]   History: Past Medical History:  Diagnosis Date   A-fib (HCC) 11/2016   Tx with po meds at this time   Anemia    Microcytic with Fe Sat 4% 10/31/10   Atypical chest pain    Carpal tunnel syndrome, left    Cataract    removed bilaterally   Chronic rhinitis    Cough    Diverticulosis    Dyspnea    " WITH MY AFIB "   GERD (gastroesophageal reflux disease)    Hiatal hernia    History of recent blood transfusion 06/05/2018   HLD (hyperlipidemia)    HTN (hypertension)    Interstitial lung disease (HCC)    Kidney stones    Osteoarthritis    left hand   Osteoporosis 2014   Sarcoidosis 8/99   Transbronchial biopsy, taking daily prednisone since  November 08 on and off since 1999 w/cough every time he relapses off it for more than several weeks   Undescended right testicle    "shrivled" in childhood from 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   Past Surgical History:  Procedure Laterality Date   CARDIOVERSION N/A 12/21/2016   Procedure: CARDIOVERSION;  Surgeon: Tammie Fall, MD;  Location: [facility] ENDOSCOPY;  Service: Cardiovascular;  Laterality: N/A;   CARDIOVERSION N/A 01/31/2017   Procedure: CARDIOVERSION;  Surgeon: Hazle Lites, MD;  Location: [facility] ENDOSCOPY;  Service: Cardiovascular;  Laterality: N/A;   CATARACT EXTRACTION Left 10/14/2013   CATARACT EXTRACTION Right 11/24/2013   CERVICAL FUSION     pt states he did not have a cervical fusion, just a biopsy    COLONOSCOPY  01/12/2006   tics only    ENDOVENOUS ABLATION SAPHENOUS VEIN W/ LASER Left 10/23/2018   endovenous laser ablation left greater saphenous vein by Stacia Dynes MD    ENDOVENOUS ABLATION SAPHENOUS VEIN W/ LASER Right 12/04/2018   endovenous laser ablation right greater saphenous vein by Stacia Dynes MD    INGUINAL HERNIA REPAIR     right   LIPOMA EXCISION Left 09/16/2004   forearm   spinal cord biopsyOther]   1996   sarcoid   Family History  Problem Relation Age of Onset   Asthma Mother    Diabetes Mother    Arthritis Paternal Grandmother    Heart disease Paternal Grandmother    Hypertension Father        Fathers family most   Prostate cancer Father    Kidney disease Father    Colon polyps Sister    Multiple myeloma Sister    Heart disease Paternal Uncle    Kidney disease Sister    Sarcoidosis Neg Hx    Other Neg Hx        osteoporosis   Colon cancer Neg Hx    Esophageal cancer Neg Hx    Rectal cancer Neg Hx    Stomach cancer Neg Hx    Social History  Socioeconomic History   Marital status: Single    Spouse name: Not on file   Number of children: 0   Years of education: Not on file   Highest education level: Bachelor's degree (e.g., BA, AB, BS)  Occupational History   Occupation: Retired    Associate Professor: Advice worker  Tobacco Use   Smoking status: Never    Passive exposure: Never   Smokeless tobacco: Never  Vaping Use   Vaping status: Never Used  Substance and Sexual Activity   Alcohol use: No    Alcohol/week: 0.0 standard drinks of alcohol   Drug use: No   Sexual activity: Not Currently  Other Topics Concern   Not on file  Social History Narrative   Lives alone   Regular Exercise -  NO   Social Drivers of Health   Financial Resource Strain: Medium Risk (03/19/2024)   Overall Financial Resource Strain (CARDIA)    Difficulty of Paying Living Expenses: Somewhat hard  Food Insecurity: No Food Insecurity (03/19/2024)   Hunger Vital Sign    Worried About Running Out of Food in the Last Year: Never true    Ran Out of Food in the Last Year: Never true  Transportation Needs: No Transportation Needs (03/19/2024)   PRAPARE - Administrator, Civil Service (Medical): No    Lack of Transportation (Non-Medical): No  Physical Activity: Sufficiently Active (03/19/2024)   Exercise Vital Sign    Days of Exercise per Week: 5 days    Minutes of Exercise per Session: 30  min  Stress: No Stress Concern Present (03/19/2024)   [employer] of Occupational Health - Occupational Stress Questionnaire    Feeling of Stress : Only a little  Social Connections: Socially Isolated (03/19/2024)   Social Connection and Isolation Panel [NHANES]    Frequency of Communication with Friends and Family: Once a week    Frequency of Social Gatherings with Friends and Family: Once a week    Attends Religious Services: 1 to 4 times per year    Active Member of [employer] or Organizations: No    Attends Engineer, structural: Never    Marital Status: Never married    Tobacco Counseling Counseling given: No    Clinical Intake:  Pre-visit preparation completed: Yes  Pain : No/denies pain     BMI - recorded: 28.33 Nutritional Risks: None Diabetes: No  No results found for: "HGBA1C"   How often do you need to have someone help you when you read instructions, pamphlets, or other written materials from your doctor or pharmacy?: 1 - Never  Interpreter Needed?: No  Information entered by :: Kandy Orris, CMA   Activities of Daily Living     03/19/2024    3:46 PM 03/14/2024    8:54 AM  In your present state of health, do you have any difficulty performing the following activities:  Hearing? 0 0  Vision? 0 0  Difficulty concentrating or making decisions? 0 0  Walking or climbing stairs? 0 0  Dressing or bathing? 0 0  Doing errands, shopping? 0 0  Preparing Food and eating ? N N  Using the Toilet? N N  In the past six months, have you accidently leaked urine? N N  Do you have problems with loss of bowel control? N N  Managing your Medications? N N  Managing your Finances? N N  Housekeeping or managing your Housekeeping? N N    Patient Care Team: Arcadio Knuckles, MD as PCP -  General Tammie Fall, MD as PCP - Cardiology (Cardiology) Tammie Fall, MD as PCP - Electrophysiology (Cardiology) Love, Hoy Mackintosh, MD (Neurology) Diamond Formica, MD as  Attending Physician (Pulmonary Disease) Tammie Fall, MD as Consulting Physician (Cardiology) Nicolas Barren, MD as Consulting Physician (Nephrology) Burundi, Heather, OD as Consulting Physician (Optometry)  Indicate any recent Medical Services you may have received from other than [facility] providers in the past year (date may be approximate).     Assessment:   This is a routine wellness examination for Eldon.  Hearing/Vision screen Hearing Screening - Comments:: Denies hearing difficulties   Vision Screening - Comments:: Wears rx glasses - up to date with routine eye exams with Dr Burundi   Goals Addressed               This Visit's Progress     Patient Stated (pt-stated)        Patient stated he is trying to get organized and manage his sleep schedule to take medications on time.       Depression Screen     03/19/2024    3:51 PM 09/06/2023   10:13 AM 03/19/2023    3:15 PM 02/21/2022    2:53 PM 02/09/2021    2:11 PM 01/24/2021    1:33 PM 09/23/2019   11:29 AM  PHQ 2/9 Scores  PHQ - 2 Score 0 0 0 0 0 0 0  PHQ- 9 Score 3  0        Fall Risk     03/19/2024    3:47 PM 03/14/2024    8:54 AM 09/06/2023   10:12 AM 03/19/2023    3:18 PM 03/15/2023    2:18 PM  Fall Risk   Falls in the past year? 1 1 1 1 1   Number falls in past yr: 1 1 1  0 0  Comment 2      Injury with Fall? 0 1 1 0 0  Risk for fall due to :   History of fall(s) No Fall Risks   Follow up Falls evaluation completed;Falls prevention discussed  Falls evaluation completed Falls prevention discussed     MEDICARE RISK AT HOME:  Medicare Risk at Home Any stairs in or around the home?: No If so, are there any without handrails?: No Home free of loose throw rugs in walkways, pet beds, electrical cords, etc?: Yes Adequate lighting in your home to reduce risk of falls?: Yes Life alert?: No Use of a cane, walker or w/c?: No Grab bars in the bathroom?: No Shower chair or bench in shower?: No Elevated toilet seat or a  handicapped toilet?: No  TIMED UP AND GO:  Was the test performed?  No  Cognitive Function: 6CIT completed    09/13/2017    2:53 PM  MMSE - Mini Mental State Exam  Orientation to time 5  Orientation to Place 5  Registration 3  Attention/ Calculation 5  Recall 2  Language- name 2 objects 2  Language- repeat 1  Language- follow 3 step command 3  Language- read & follow direction 1  Write a sentence 1  Copy design 1  Total score 29        03/19/2024    3:53 PM 03/19/2023    3:08 PM 02/21/2022    3:03 PM  6CIT Screen  What Year? 0 points 0 points 0 points  What month? 0 points 0 points 0 points  What time? 0 points 0 points 0 points  Count back from 20 0 points 0 points 0 points  Months in reverse 0 points 0 points 0 points  Repeat phrase 0 points 0 points 0 points  Total Score 0 points 0 points 0 points    Immunizations Immunization History  Administered Date(s) Administered   Fluad Quad(high Dose 65+) 08/19/2019, 08/11/2020, 09/26/2021   Influenza Whole 10/31/2010, 09/14/2011, 10/16/2012   Influenza, High Dose Seasonal PF 07/20/2013, 08/28/2017, 10/03/2018   Influenza,inj,Quad PF,6+ Mos 01/12/2015, 11/02/2015   Influenza-Unspecified 08/22/2016, 08/18/2022   PFIZER(Purple Top)SARS-COV-2 Vaccination 01/03/2020, 01/27/2020, 08/28/2020, 10/16/2022   Pfizer Covid-19 Vaccine Bivalent Booster 48yrs & up 05/30/2021, 11/10/2021   Pneumococcal Conjugate-13 01/12/2015   Pneumococcal Polysaccharide-23 12/14/2000, 01/19/2016, 05/09/2022, 12/27/2022   Td 10/31/2010   Tdap 08/11/2020, 05/03/2023   Unspecified SARS-COV-2 Vaccination 08/16/2023   Zoster Recombinant(Shingrix) 08/21/2019, 10/28/2019   Zoster, Live 04/20/2016    Screening Tests Health Maintenance  Topic Date Due   COVID-19 Vaccine (8 - 2024-25 season) 02/14/2024   INFLUENZA VACCINE  06/13/2024   Medicare Annual Wellness (AWV)  03/19/2025   DTaP/Tdap/Td (4 - Td or Tdap) 05/02/2033   Pneumonia Vaccine 65+ Years  old  Completed   Hepatitis C Screening  Completed   Zoster Vaccines- Shingrix  Completed   HPV VACCINES  Aged Out   Meningococcal B Vaccine  Aged Out   Colonoscopy  Discontinued    Health Maintenance  Health Maintenance Due  Topic Date Due   COVID-19 Vaccine (8 - 2024-25 season) 02/14/2024   Health Maintenance Items Addressed:   Additional Screening:  Vision Screening: Recommended annual ophthalmology exams for early detection of glaucoma and other disorders of the eye.  Dental Screening: Recommended annual dental exams for proper oral hygiene  Community Resource Referral / Chronic Care Management: CRR required this visit?  No   CCM required this visit?  No     Plan:     I have personally reviewed and noted the following in the patient's chart:   Medical and social history Use of alcohol, tobacco or illicit drugs  Current medications and supplements including opioid prescriptions. Patient is not currently taking opioid prescriptions. Functional ability and status Nutritional status Physical activity Advanced directives List of other physicians Hospitalizations, surgeries, and ER visits in previous 12 months Vitals Screenings to include cognitive, depression, and falls Referrals and appointments  In addition, I have reviewed and discussed with patient certain preventive protocols, quality metrics, and best practice recommendations. A written personalized care plan for preventive services as well as general preventive health recommendations were provided to patient.     Patria Bookbinder, CMA   03/19/2024   After Visit Summary: (MyChart) Due to this being a telephonic visit, the after visit summary with patients personalized plan was offered to patient via MyChart   Notes: Nothing significant to report at this time.

## 2024-03-19 NOTE — Patient Instructions (Signed)
 Mr. Gerstenberger , Thank you for taking time to come for your Medicare Wellness Visit. I appreciate your ongoing commitment to your health goals. Please review the following plan we discussed and let me know if I can assist you in the future.   Referrals/Orders/Follow-Ups/Clinician Recommendations: Aim for 30 minutes of exercise or brisk walking, 6-8 glasses of water, and 5 servings of fruits and vegetables each day.   This is a list of the screening recommended for you and due dates:  Health Maintenance  Topic Date Due   COVID-19 Vaccine (8 - 2024-25 season) 02/14/2024   Flu Shot  06/13/2024   Medicare Annual Wellness Visit  03/19/2025   DTaP/Tdap/Td vaccine (4 - Td or Tdap) 05/02/2033   Pneumonia Vaccine  Completed   Hepatitis C Screening  Completed   Zoster (Shingles) Vaccine  Completed   HPV Vaccine  Aged Out   Meningitis B Vaccine  Aged Out   Colon Cancer Screening  Discontinued    Advanced directives: (Copy Requested) Please bring a copy of your health care power of attorney and living will to the office to be added to your chart at your convenience. You can mail to [facility] [address] [place], [place] [address] or email to [email or web address]  Next Medicare Annual Wellness Visit scheduled for next year: Yes

## 2024-04-16 ENCOUNTER — Encounter: Payer: Self-pay | Admitting: Internal Medicine

## 2024-04-16 ENCOUNTER — Ambulatory Visit (INDEPENDENT_AMBULATORY_CARE_PROVIDER_SITE_OTHER): Admitting: Internal Medicine

## 2024-04-16 VITALS — BP 142/86 | HR 60 | Temp 98.7°F | Resp 16 | Ht 66.0 in | Wt 171.0 lb

## 2024-04-16 DIAGNOSIS — I10 Essential (primary) hypertension: Secondary | ICD-10-CM

## 2024-04-16 DIAGNOSIS — D696 Thrombocytopenia, unspecified: Secondary | ICD-10-CM

## 2024-04-16 DIAGNOSIS — D500 Iron deficiency anemia secondary to blood loss (chronic): Secondary | ICD-10-CM | POA: Diagnosis not present

## 2024-04-16 DIAGNOSIS — R0609 Other forms of dyspnea: Secondary | ICD-10-CM

## 2024-04-16 DIAGNOSIS — N183 Chronic kidney disease, stage 3 unspecified: Secondary | ICD-10-CM

## 2024-04-16 DIAGNOSIS — E785 Hyperlipidemia, unspecified: Secondary | ICD-10-CM

## 2024-04-16 DIAGNOSIS — R600 Localized edema: Secondary | ICD-10-CM

## 2024-04-16 DIAGNOSIS — R7989 Other specified abnormal findings of blood chemistry: Secondary | ICD-10-CM | POA: Diagnosis not present

## 2024-04-16 DIAGNOSIS — D869 Sarcoidosis, unspecified: Secondary | ICD-10-CM

## 2024-04-16 LAB — HEPATIC FUNCTION PANEL
ALT: 17 U/L (ref 0–53)
AST: 22 U/L (ref 0–37)
Albumin: 4.1 g/dL (ref 3.5–5.2)
Alkaline Phosphatase: 115 U/L (ref 39–117)
Bilirubin, Direct: 0.2 mg/dL (ref 0.0–0.3)
Total Bilirubin: 0.9 mg/dL (ref 0.2–1.2)
Total Protein: 7.5 g/dL (ref 6.0–8.3)

## 2024-04-16 LAB — CBC WITH DIFFERENTIAL/PLATELET
Basophils Absolute: 0 10*3/uL (ref 0.0–0.1)
Basophils Relative: 1 % (ref 0.0–3.0)
Eosinophils Absolute: 0.2 10*3/uL (ref 0.0–0.7)
Eosinophils Relative: 5.2 % — ABNORMAL HIGH (ref 0.0–5.0)
HCT: 44.4 % (ref 39.0–52.0)
Hemoglobin: 14.1 g/dL (ref 13.0–17.0)
Lymphocytes Relative: 19 % (ref 12.0–46.0)
Lymphs Abs: 0.7 10*3/uL (ref 0.7–4.0)
MCHC: 31.7 g/dL (ref 30.0–36.0)
MCV: 86.9 fl (ref 78.0–100.0)
Monocytes Absolute: 0.3 10*3/uL (ref 0.1–1.0)
Monocytes Relative: 9.6 % (ref 3.0–12.0)
Neutro Abs: 2.2 10*3/uL (ref 1.4–7.7)
Neutrophils Relative %: 65.2 % (ref 43.0–77.0)
Platelets: 129 10*3/uL — ABNORMAL LOW (ref 150.0–400.0)
RBC: 5.11 Mil/uL (ref 4.22–5.81)
RDW: 16 % — ABNORMAL HIGH (ref 11.5–15.5)
WBC: 3.4 10*3/uL — ABNORMAL LOW (ref 4.0–10.5)

## 2024-04-16 LAB — BASIC METABOLIC PANEL WITH GFR
BUN: 28 mg/dL — ABNORMAL HIGH (ref 6–23)
CO2: 31 meq/L (ref 19–32)
Calcium: 9.5 mg/dL (ref 8.4–10.5)
Chloride: 102 meq/L (ref 96–112)
Creatinine, Ser: 2.02 mg/dL — ABNORMAL HIGH (ref 0.40–1.50)
GFR: 31.42 mL/min — ABNORMAL LOW (ref >60.00)
Glucose, Bld: 86 mg/dL (ref 70–99)
Potassium: 3.8 meq/L (ref 3.5–5.1)
Sodium: 141 meq/L (ref 135–145)

## 2024-04-16 LAB — LIPID PANEL
Cholesterol: 146 mg/dL (ref 0–200)
HDL: 57.7 mg/dL (ref >39.00)
LDL Cholesterol: 73 mg/dL (ref 0–99)
NonHDL: 87.89
Total CHOL/HDL Ratio: 3
Triglycerides: 75 mg/dL (ref 0.0–149.0)
VLDL: 15 mg/dL (ref 0.0–40.0)

## 2024-04-16 LAB — IBC + FERRITIN
Ferritin: 15.1 ng/mL — ABNORMAL LOW (ref 22.0–322.0)
Iron: 65 ug/dL (ref 42–165)
Saturation Ratios: 15.4 % — ABNORMAL LOW (ref 20.0–50.0)
TIBC: 422.8 ug/dL (ref 250.0–450.0)
Transferrin: 302 mg/dL (ref 212.0–360.0)

## 2024-04-16 LAB — TROPONIN I (HIGH SENSITIVITY): High Sens Troponin I: 17 ng/L (ref 2–17)

## 2024-04-16 LAB — BRAIN NATRIURETIC PEPTIDE: Pro B Natriuretic peptide (BNP): 214 pg/mL — ABNORMAL HIGH (ref 0.0–100.0)

## 2024-04-16 LAB — TSH: TSH: 1.36 u[IU]/mL (ref 0.35–5.50)

## 2024-04-16 LAB — D-DIMER, QUANTITATIVE: D-Dimer, Quant: 0.28 ug{FEU}/mL (ref <0.50)

## 2024-04-16 NOTE — Progress Notes (Signed)
 Subjective:  Patient ID: Richard Blake, male    DOB: 03-16-1946  Age: 78 y.o. MRN: [id number]  CC: Hypertension and Hyperlipidemia        HPI BRADIE LACOCK presents for f/up ----  Discussed the use of AI scribe software for clinical note transcription with the patient, who gave verbal consent to proceed.  History of Present Illness   Richard Blake is a 78 year old male with atrial fibrillation and hypertension who presents with concerns about blood pressure fluctuations and leg swelling.  He experiences fluctuations in blood pressure and DOE, with occasional spikes reaching 160 mmHg. These spikes often occur when he feels something is 'going on in my system.' No headache, blurred vision, chest pain, he feels slightly winded when walking from the parking lot, which he attributes to his atrial fibrillation. His torsemide  use has been erratic over the past month or two, affecting his sleep.  He has leg swelling, a consistent issue since the onset of his atrial fibrillation. The swelling varies day to day. He previously increased his thiazide diuretic to one and a half pills, but he did not notice any improvement.  He describes his heart rate as irregular and can tell when it 'kicks in.'       Outpatient Medications Prior to Visit  Medication Sig Dispense Refill   acetaminophen (TYLENOL) 500 MG tablet Take 500-1,000 mg by mouth every 6 (six) hours as needed (for pain from muscle spasms).     allopurinol  (ZYLOPRIM ) 100 MG tablet TAKE 2 TABLETS(200 MG) BY MOUTH DAILY 180 tablet 0   Cholecalciferol  50 MCG (2000 UT) TABS Take 1 tablet (2,000 Units total) by mouth daily. (Patient taking differently: Take 1 tablet by mouth as needed.) 90 tablet 1   Dietary Management Product (FOSTEUM PLUS) CAPS Take 1 capsule by mouth 2 (two) times daily. (Patient taking differently: Take 1 capsule by mouth as needed.) 60 capsule 11   ferrous sulfate  325 (65 FE) MG tablet Take 1 tablet (325 mg  total) by mouth 3 (three) times daily with meals. (Patient taking differently: Take 325 mg by mouth as needed.) 90 tablet 2   finasteride  (PROSCAR ) 5 MG tablet Take 1 tablet (5 mg total) by mouth daily. (Patient taking differently: Take 5 mg by mouth daily. Couple times a week per pt) 90 tablet 1   losartan  (COZAAR ) 100 MG tablet TAKE 1 TABLET(100 MG) BY MOUTH DAILY 90 tablet 3   metoprolol  tartrate (LOPRESSOR ) 25 MG tablet TAKE 1 TABLET(25 MG) BY MOUTH TWICE DAILY 180 tablet 0   pantoprazole  (PROTONIX ) 40 MG tablet TAKE 1 TABLET(40 MG) BY MOUTH DAILY (Patient taking differently: Take 40 mg by mouth as needed.) 90 tablet 1   Rivaroxaban  (XARELTO ) 15 MG TABS tablet Take 1 tablet (15 mg total) by mouth daily with supper. 90 tablet 1   rosuvastatin  (CRESTOR ) 10 MG tablet Take 1 tablet (10 mg total) by mouth daily. (Patient taking differently: Take 10 mg by mouth daily. As needed per pt) 90 tablet 1   STIOLTO RESPIMAT  2.5-2.5 MCG/ACT AERS INHALE 2 PUFFS INTO THE LUNGS DAILY 12 g 1   SYSTANE ULTRA 0.4-0.3 % SOLN SMARTSIG:1 Drop(s) In Eye(s) As Needed     terazosin  (HYTRIN ) 2 MG capsule Take 1 capsule (2 mg total) by mouth at bedtime. 90 capsule 0   torsemide  (DEMADEX ) 20 MG tablet TAKE 1 TABLET(20 MG) BY MOUTH DAILY 90 tablet 0   vitamin B-12 (CYANOCOBALAMIN ) 250 MCG tablet Take 250  mcg by mouth daily.     No facility-administered medications prior to visit.    ROS Review of Systems  Constitutional:  Negative for appetite change, chills, diaphoresis, fatigue and fever.  HENT: Negative.    Eyes:  Negative for visual disturbance.  Respiratory:  Positive for shortness of breath. Negative for cough, chest tightness and wheezing.   Cardiovascular:  Positive for palpitations and leg swelling. Negative for chest pain.  Gastrointestinal:  Negative for abdominal pain, constipation, diarrhea, nausea and vomiting.  Genitourinary: Negative.  Negative for difficulty urinating.  Musculoskeletal: Negative.   Negative for arthralgias.  Skin: Negative.   Neurological:  Negative for dizziness, weakness, light-headedness and headaches.  Hematological:  Negative for adenopathy. Does not bruise/bleed easily.  Psychiatric/Behavioral: Negative.      Objective:  BP (!) 142/86 (BP Location: Left Arm, Patient Position: Sitting, Cuff Size: Normal)   Pulse 60   Temp 98.7 F (37.1 C) (Oral)   Resp 16   Ht 5\' 6"  (1.676 m)   Wt 171 lb (77.6 kg)   SpO2 99%   BMI 27.60 kg/m   BP Readings from Last 3 Encounters:  04/16/24 (!) 142/86  03/19/24 117/82  11/12/23 134/64    Wt Readings from Last 3 Encounters:  04/16/24 171 lb (77.6 kg)  03/19/24 175 lb 8 oz (79.6 kg)  11/12/23 175 lb 9.6 oz (79.7 kg)    Physical Exam Vitals reviewed.  Constitutional:      Appearance: Normal appearance.  HENT:     Nose: Nose normal.     Mouth/Throat:     Mouth: Mucous membranes are moist.  Eyes:     General: No scleral icterus.    Conjunctiva/sclera: Conjunctivae normal.  Cardiovascular:     Rate and Rhythm: Normal rate. Rhythm irregularly irregular.     Heart sounds: No murmur heard.    Comments: EKG--- A fib, 60 bpm Incomplete RBBB No Q waves Unchanged  Pulmonary:     Breath sounds: No stridor. No wheezing, rhonchi or rales.  Abdominal:     General: Abdomen is flat.     Palpations: There is no mass.     Tenderness: There is no abdominal tenderness. There is no guarding.     Hernia: No hernia is present.  Musculoskeletal:        General: No swelling.     Cervical back: Neck supple.     Right lower leg: 2+ Pitting Edema present.     Left lower leg: 2+ Pitting Edema present.  Lymphadenopathy:     Cervical: No cervical adenopathy.  Skin:    General: Skin is warm and dry.     Findings: No erythema or lesion.  Neurological:     General: No focal deficit present.     Mental Status: He is alert. Mental status is at baseline.  Psychiatric:        Mood and Affect: Mood normal.        Behavior:  Behavior normal.     Lab Results  Component Value Date   WBC 3.4 (L) 04/16/2024   HGB 14.1 04/16/2024   HCT 44.4 04/16/2024   PLT 129.0 (L) 04/16/2024   GLUCOSE 86 04/16/2024   CHOL 146 04/16/2024   TRIG 75.0 04/16/2024   HDL 57.70 04/16/2024   LDLDIRECT 186.5 10/23/2012   LDLCALC 73 04/16/2024   ALT 17 04/16/2024   AST 22 04/16/2024   NA 141 04/16/2024   K 3.8 04/16/2024   CL 102 04/16/2024   CREATININE  2.02 (H) 04/16/2024   BUN 28 (H) 04/16/2024   CO2 31 04/16/2024   TSH 1.36 04/16/2024   PSA 4.17 (H) 09/06/2023   INR 1.1 12/14/2016    DG Bone Density Result Date: 06/26/2023 Table formatting from the original result was not included. Date of study: 06/26/2023 Exam: DUAL X-RAY ABSORPTIOMETRY (DXA) FOR BONE MINERAL DENSITY (BMD) Instrument: [employer] Requesting Provider: PCP Indication: follow up for osteoporosis Comparison: 02/23/2017 Clinical data: Pt is a 78 y.o. male with previous history of distal radius fracture. On calcium  and vitamin D . Results:  Lumbar spine L1- L2 (L3, L4) Femoral neck (FN) 33% distal radius T-score -1.4 RFN: -2.0 LFN: -1.7 n/a Change in BMD from previous DXA test (%) -2.4% +0.5% n/a (*) statistically significant Assessment: the BMD is low according to the [facility] classification for osteoporosis (see below). Fracture risk: moderate FRAX score: 10 year major osteoporotic risk: 6.1%. 10-year hip fracture risk: 2.1%. The thresholds for treatment are 20% and 3%, respectively. Comments: the technical quality of the study is good, however, L3 and L4 vertebrae had to be excluded from analysis due to degenerative changes. Evaluation for secondary causes should be considered if clinically indicated. Recommend optimizing calcium  (1200 mg/day) and vitamin D  (800 IU/day) intake. Followup: Repeat BMD is appropriate after 2 years WHO criteria for diagnosis of osteoporosis in postmenopausal women and in men 90 y/o or older: - normal: T-score -1.0 to + 1.0 -  osteopenia/low bone density: T-score between -2.5 and -1.0 - osteoporosis: T-score below -2.5 - severe osteoporosis: T-score below -2.5 with history of fragility fracture Note: although not part of the WHO classification, the presence of a fragility fracture, regardless of the T-score, should be considered diagnostic of osteoporosis, provided other causes for the fracture have been excluded. Treatment: The National Osteoporosis Foundation recommends that treatment be considered in postmenopausal women and men age 69 or older with: 1. Hip or vertebral (clinical or morphometric) fracture 2. T-score of - 2.5 or lower at the spine or hip 3. 10-year fracture probability by FRAX of at least 20% for a major osteoporotic fracture and 3% for a hip fracture Emilie Harden, MD [facility]    Assessment & Plan:  DOE (dyspnea on exertion) -     Brain natriuretic peptide; Future -     Troponin I (High Sensitivity); Future -     D-dimer, quantitative; Future -     ECHOCARDIOGRAM COMPLETE; Future -     MYOCARDIAL PERFUSION IMAGING; Future -     Cardiac Stress Test: Informed Consent Details: Physician/Practitioner Attestation; Transcribe to consent form and obtain patient signature; Future -     EKG 12-Lead  Bilateral leg edema -     Brain natriuretic peptide; Future -     Troponin I (High Sensitivity); Future -     D-dimer, quantitative; Future -     TSH; Future -     Hepatic function panel; Future -     ECHOCARDIOGRAM COMPLETE; Future -     MYOCARDIAL PERFUSION IMAGING; Future -     Cardiac Stress Test: Informed Consent Details: Physician/Practitioner Attestation; Transcribe to consent form and obtain patient signature; Future  Hyperlipidemia with target LDL less than 130- LDL goal achieved. Doing well on the statin  -     Lipid panel; Future -     TSH; Future -     Hepatic function panel; Future  Essential hypertension- His BP is well controlled. -     TSH; Future -  Hepatic function  panel; Future -     Basic metabolic panel with GFR; Future -     EKG 12-Lead  Iron deficiency anemia due to chronic blood loss -     CBC with Differential/Platelet; Future -     IBC + Ferritin; Future  Sarcoidosis -     Basic metabolic panel with GFR; Future  CRI (chronic renal insufficiency), stage 3 (moderate) -     Basic metabolic panel with GFR; Future  Thrombocytopenia (HCC)- No B/B.  Elevated brain natriuretic peptide (BNP) level -     ECHOCARDIOGRAM COMPLETE; Future -     MYOCARDIAL PERFUSION IMAGING; Future -     Cardiac Stress Test: Informed Consent Details: Physician/Practitioner Attestation; Transcribe to consent form and obtain patient signature; Future     Follow-up: Return in about 4 months (around 08/16/2024).  Sandra Crouch, MD

## 2024-04-16 NOTE — Patient Instructions (Signed)
 Hypertension, Adult High blood pressure (hypertension) is when the force of blood pumping through the arteries is too strong. The arteries are the blood vessels that carry blood from the heart throughout the body. Hypertension forces the heart to work harder to pump blood and may cause arteries to become narrow or stiff. Untreated or uncontrolled hypertension can lead to a heart attack, heart failure, a stroke, kidney disease, and other problems. A blood pressure reading consists of a higher number over a lower number. Ideally, your blood pressure should be below 120/80. The first ("top") number is called the systolic pressure. It is a measure of the pressure in your arteries as your heart beats. The second ("bottom") number is called the diastolic pressure. It is a measure of the pressure in your arteries as the heart relaxes. What are the causes? The exact cause of this condition is not known. There are some conditions that result in high blood pressure. What increases the risk? Certain factors may make you more likely to develop high blood pressure. Some of these risk factors are under your control, including: Smoking. Not getting enough exercise or physical activity. Being overweight. Having too much fat, sugar, calories, or salt (sodium) in your diet. Drinking too much alcohol. Other risk factors include: Having a personal history of heart disease, diabetes, high cholesterol, or kidney disease. Stress. Having a family history of high blood pressure and high cholesterol. Having obstructive sleep apnea. Age. The risk increases with age. What are the signs or symptoms? High blood pressure may not cause symptoms. Very high blood pressure (hypertensive crisis) may cause: Headache. Fast or irregular heartbeats (palpitations). Shortness of breath. Nosebleed. Nausea and vomiting. Vision changes. Severe chest pain, dizziness, and seizures. How is this diagnosed? This condition is diagnosed by  measuring your blood pressure while you are seated, with your arm resting on a flat surface, your legs uncrossed, and your feet flat on the floor. The cuff of the blood pressure monitor will be placed directly against the skin of your upper arm at the level of your heart. Blood pressure should be measured at least twice using the same arm. Certain conditions can cause a difference in blood pressure between your right and left arms. If you have a high blood pressure reading during one visit or you have normal blood pressure with other risk factors, you may be asked to: Return on a different day to have your blood pressure checked again. Monitor your blood pressure at home for 1 week or longer. If you are diagnosed with hypertension, you may have other blood or imaging tests to help your health care provider understand your overall risk for other conditions. How is this treated? This condition is treated by making healthy lifestyle changes, such as eating healthy foods, exercising more, and reducing your alcohol intake. You may be referred for counseling on a healthy diet and physical activity. Your health care provider may prescribe medicine if lifestyle changes are not enough to get your blood pressure under control and if: Your systolic blood pressure is above 130. Your diastolic blood pressure is above 80. Your personal target blood pressure may vary depending on your medical conditions, your age, and other factors. Follow these instructions at home: Eating and drinking  Eat a diet that is high in fiber and potassium, and low in sodium, added sugar, and fat. An example of this eating plan is called the DASH diet. DASH stands for Dietary Approaches to Stop Hypertension. To eat this way: Eat  plenty of fresh fruits and vegetables. Try to fill one half of your plate at each meal with fruits and vegetables. Eat whole grains, such as whole-wheat pasta, brown rice, or whole-grain bread. Fill about one  fourth of your plate with whole grains. Eat or drink low-fat dairy products, such as skim milk or low-fat yogurt. Avoid fatty cuts of meat, processed or cured meats, and poultry with skin. Fill about one fourth of your plate with lean proteins, such as fish, chicken without skin, beans, eggs, or tofu. Avoid pre-made and processed foods. These tend to be higher in sodium, added sugar, and fat. Reduce your daily sodium intake. Many people with hypertension should eat less than 1,500 mg of sodium a day. Do not drink alcohol if: Your health care provider tells you not to drink. You are pregnant, may be pregnant, or are planning to become pregnant. If you drink alcohol: Limit how much you have to: 0-1 drink a day for women. 0-2 drinks a day for men. Know how much alcohol is in your drink. In the [place] one drink equals one 12 oz bottle of beer (355 mL), one 5 oz glass of wine (148 mL), or one 1 oz glass of hard liquor (44 mL). Lifestyle  Work with your health care provider to maintain a healthy body weight or to lose weight. Ask what an ideal weight is for you. Get at least 30 minutes of exercise that causes your heart to beat faster (aerobic exercise) most days of the week. Activities may include walking, swimming, or biking. Include exercise to strengthen your muscles (resistance exercise), such as Pilates or lifting weights, as part of your weekly exercise routine. Try to do these types of exercises for 30 minutes at least 3 days a week. Do not use any products that contain nicotine or tobacco. These products include cigarettes, chewing tobacco, and vaping devices, such as e-cigarettes. If you need help quitting, ask your health care provider. Monitor your blood pressure at home as told by your health care provider. Keep all follow-up visits. This is important. Medicines Take over-the-counter and prescription medicines only as told by your health care provider. Follow directions carefully. Blood  pressure medicines must be taken as prescribed. Do not skip doses of blood pressure medicine. Doing this puts you at risk for problems and can make the medicine less effective. Ask your health care provider about side effects or reactions to medicines that you should watch for. Contact a health care provider if you: Think you are having a reaction to a medicine you are taking. Have headaches that keep coming back (recurring). Feel dizzy. Have swelling in your ankles. Have trouble with your vision. Get help right away if you: Develop a severe headache or confusion. Have unusual weakness or numbness. Feel faint. Have severe pain in your chest or abdomen. Vomit repeatedly. Have trouble breathing. These symptoms may be an emergency. Get help right away. Call 911. Do not wait to see if the symptoms will go away. Do not drive yourself to the hospital. Summary Hypertension is when the force of blood pumping through your arteries is too strong. If this condition is not controlled, it may put you at risk for serious complications. Your personal target blood pressure may vary depending on your medical conditions, your age, and other factors. For most people, a normal blood pressure is less than 120/80. Hypertension is treated with lifestyle changes, medicines, or a combination of both. Lifestyle changes include losing weight, eating a healthy,  low-sodium diet, exercising more, and limiting alcohol. This information is not intended to replace advice given to you by your health care provider. Make sure you discuss any questions you have with your health care provider. Document Revised: 09/06/2021 Document Reviewed: 09/06/2021 Elsevier Patient Education  2024 [employer].

## 2024-04-23 ENCOUNTER — Ambulatory Visit: Payer: Self-pay | Admitting: Internal Medicine

## 2024-04-25 ENCOUNTER — Ambulatory Visit (INDEPENDENT_AMBULATORY_CARE_PROVIDER_SITE_OTHER): Admitting: Pulmonary Disease

## 2024-04-25 ENCOUNTER — Encounter: Payer: Self-pay | Admitting: Pulmonary Disease

## 2024-04-25 VITALS — BP 147/73 | HR 72 | Ht 65.0 in | Wt 170.0 lb

## 2024-04-25 DIAGNOSIS — J849 Interstitial pulmonary disease, unspecified: Secondary | ICD-10-CM

## 2024-04-25 DIAGNOSIS — D869 Sarcoidosis, unspecified: Secondary | ICD-10-CM

## 2024-04-25 NOTE — Patient Instructions (Signed)
 VISIT SUMMARY:  You had a follow-up appointment to check on your lung health and other ongoing conditions. Your sarcoidosis remains in remission, and you are currently not experiencing significant respiratory issues. We also discussed your recent experience with coughing and congestion after receiving a COVID-19 booster shot, which has since improved. Additionally, we reviewed your chronic atrial fibrillation and noted an elevated BNP level, which may indicate possible heart failure. Your recent ophthalmology check-up and liver tests were normal.  YOUR PLAN:  -CHRONIC ATRIAL FIBRILLATION: Chronic atrial fibrillation is a condition where your heart has an irregular and often rapid heart rate. Your blood pressure was initially high but normalized upon retesting. An EKG confirmed your chronic AFib, and no acute symptoms were reported. We will continue to monitor your heart condition closely.  -ELEVATED BNP INDICATING POSSIBLE HEART FAILURE: An elevated BNP level can be a sign of heart failure. To further evaluate this, we have planned an echocardiogram and a stress test to check for any heart failure symptoms.  -SARCOIDOSIS: Sarcoidosis is a condition where clusters of inflammatory cells grow in different parts of your body, usually the lungs. Your sarcoidosis has been in remission since at least 2000, and you are currently not experiencing any symptoms. We will defer your next CT scan for one year due to your well-managed condition.  -COVID-19 INFECTION: You experienced coughing and congestion after receiving a COVID-19 booster shot in October 2024, which lasted until February 2025. These symptoms have significantly improved since March 2025. We discussed the timing of your next COVID booster shot and decided to wait until the fall due to your previous side effects and current stability.  INSTRUCTIONS:  Please follow up with the planned echocardiogram and stress test to evaluate for heart failure  symptoms. Additionally, plan to get your next COVID booster shot in the fall. Your next CT scan for sarcoidosis is scheduled for one year from now.

## 2024-04-25 NOTE — Progress Notes (Signed)
 Richard Blake    [id number]    May 16, 1946  Primary Care Physician:Blake, Richard Burton, MD  Referring Physician: Arcadio Knuckles, MD [address] [place],  [place] [address]  Chief complaint: Follow up for interstitial lung disease, sarcoidosis  HPI: 78 y.o. who  has a past medical history of A-fib (HCC) (11/2016), Anemia, Atypical chest pain, Carpal tunnel syndrome, left, Cataract, Chronic rhinitis, Cough, Diverticulosis, Dyspnea, GERD (gastroesophageal reflux disease), Hiatal hernia, History of recent blood transfusion (06/05/2018), HLD (hyperlipidemia), HTN (hypertension), Interstitial lung disease (HCC), Kidney stones, Osteoarthritis, Osteoporosis (2014), Sarcoidosis (8/99), and Undescended right testicle.   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  He had spinal tissue biopsy in 1986 and a transbronchial lung biopsy in August 1999.  He was followed at that time by Dr. Waymond Hailey and treated with steroids from 1998 to 2009 when he was weaned off steroids  His chief complaint today is cough with chest congestion that started in 2022.  He relates this temporally to the current dose of COVID-19.Richard Blake  He is having a chest x-ray for respiratory failure that showed mild interstitial changes and has been sent in for further evaluation.  He has been started on stiolto inhaler by primary care but cannot tell if it is helping and feels it is too expensive  Pets: No pets Occupation: Retired Hydrologist Exposures: No mold, hot tub, Jacuzzi.  No feather pillows or comforters. ILD questionnaire 07/10/2022-negative Smoking history: Never smoker Travel history: No significant lung disease Relevant family history: Father had TB in the 50s  Interim history: Discussed the use of AI scribe software for clinical note transcription with the patient, who gave verbal consent to proceed.  History of  Present Illness Richard Blake is a 78 year old male with sarcoidosis in remission who presents for a pulmonary follow-up.  He experienced coughing and congestion that began approximately one month after receiving a COVID-19 booster shot in October 2024. These symptoms persisted until February 2025, after which they significantly decreased. He associates these symptoms with side effects from the COVID-19 vaccine, noting a pattern similar to reactions he has had with flu shots in the past. No current significant coughing and congestion.  He has a history of sarcoidosis, which has been in remission since at least 2000. He is not currently using any inhalers, having stopped Stiolto. Previous CT scans have shown chronic fibrotic changes, but he is currently asymptomatic with no significant respiratory issues.  He has chronic atrial fibrillation and reports experiencing a heart rhythm change upon arriving at a recent appointment, with an initial high blood pressure reading of 160 mmHg that later normalized. His primary care doctor is monitoring his heart condition, including a slightly elevated BNP level.  He received an ophthalmology check-up in March 2025, which was normal. His liver tests this month were also reported as normal.    Outpatient Encounter Medications as of 04/25/2024  Medication Sig   acetaminophen (TYLENOL) 500 MG tablet Take 500-1,000 mg by mouth every 6 (six) hours as needed (for pain from muscle spasms).   allopurinol  (ZYLOPRIM ) 100 MG tablet TAKE 2 TABLETS(200 MG) BY MOUTH DAILY   Cholecalciferol  50 MCG (2000 UT) TABS Take 1 tablet (2,000 Units total) by mouth daily.   Dietary Management Product (FOSTEUM PLUS) CAPS Take 1 capsule by mouth 2 (two) times daily.   ferrous sulfate  325 (65 FE) MG tablet Take 1  tablet (325 mg total) by mouth 3 (three) times daily with meals.   finasteride  (PROSCAR ) 5 MG tablet Take 1 tablet (5 mg total) by mouth daily.   losartan  (COZAAR ) 100 MG  tablet TAKE 1 TABLET(100 MG) BY MOUTH DAILY   metoprolol  tartrate (LOPRESSOR ) 25 MG tablet TAKE 1 TABLET(25 MG) BY MOUTH TWICE DAILY   pantoprazole  (PROTONIX ) 40 MG tablet TAKE 1 TABLET(40 MG) BY MOUTH DAILY   Rivaroxaban  (XARELTO ) 15 MG TABS tablet Take 1 tablet (15 mg total) by mouth daily with supper.   rosuvastatin  (CRESTOR ) 10 MG tablet Take 1 tablet (10 mg total) by mouth daily.   STIOLTO RESPIMAT  2.5-2.5 MCG/ACT AERS INHALE 2 PUFFS INTO THE LUNGS DAILY   SYSTANE ULTRA 0.4-0.3 % SOLN SMARTSIG:1 Drop(s) In Eye(s) As Needed   terazosin  (HYTRIN ) 2 MG capsule Take 1 capsule (2 mg total) by mouth at bedtime.   torsemide  (DEMADEX ) 20 MG tablet TAKE 1 TABLET(20 MG) BY MOUTH DAILY   vitamin B-12 (CYANOCOBALAMIN ) 250 MCG tablet Take 250 mcg by mouth daily.   No facility-administered encounter medications on file as of 04/25/2024.   Physical Exam: Blood pressure (!) 147/73, pulse 72, height 5' 5 (1.651 m), weight 170 lb (77.1 kg), SpO2 97%. Gen:      No acute distress HEENT:  EOMI, sclera anicteric Neck:     No masses; no thyromegaly Lungs:    Clear to auscultation bilaterally; normal respiratory effort CV:         Regular rate and rhythm; no murmurs Abd:      + bowel sounds; soft, non-tender; no palpable masses, no distension Ext:    No edema; adequate peripheral perfusion Neuro: alert and oriented x 3 Psych: normal mood and affect   Data Reviewed: Imaging: Chest x-ray 11/27/2018-chronic interstitial changes, left hemidiaphragm elevation, bibasilar ectasis Chest x-ray 01/24/2021-chronic interstitial changes and scattered nodularity High-resolution CT 08/29/2022-upper lobe reticular opacities with honeycomb changes, bilateral air-trapping, large hiatal hernia, aortic atherosclerosis. I reviewed images personally.  PFTs: 07/06/2016 FVC 3.19 [101%], FEV1 2.62 [100%], F/F 82, TLC 6.11 [101%], DLCO 16.38 [63%] Mild diffusion defect  09/04/2022 FVC 2.07 [59%], FEV1 1.68 [6 7%], F/F 81, TLC  4.58 [95%], DLCO 12.46 [57%] Mild restriction, moderate diffusion defect  SIT STAND TEST - goal 15 times   04/25/2024    O2 used No   PRobe - finter or forehead Finger   Number sit and stand completed - goal 15 15   Time taken to complete 57 sec   Resting Pulse Ox/HR/Dyspnea  97% and dyspnea of 0/10    Peak measures 96 % and dyspnea of 0/10   Final Pulse Ox/HR 98% and dyspnea of 0/10   Desaturated </= 88% No   Desaturated <= 3% points No   Got Tachycardic >/= 90/min No   Miscellaneous comments      Labs: Hepatic panel 04/16/2024-within normal limits CBC 04/16/2024- WBC 3.4, eos 4.2%, absolute eosinophil count  Cardiac: EKG 04/16/2024-chronic atrial fibrillation and chronic incomplete right bundle  Assessment & Plan Sarcoidosis Sarcoidosis in remission since at least 2000. Previous CT scan in October 2023 showed chronic fibrotic changes. No current symptoms of sarcoidosis exacerbation. CT scan deferred for one year due to well-managed condition. - Order CT scan in one year - Continue annual ophthalmology visit - Recent EKG and hepatic panel stable  Chronic Atrial Fibrillation Chronic atrial fibrillation with irregular heart rate. Blood pressure initially elevated but normalized upon retesting. EKG confirmed chronic AFib. No acute symptoms reported. Recent  EKG performed on April 16, 2024, due to reported AFib episode.  Elevated BNP indicating possible heart failure Elevated BNP suggesting possible heart failure. Echocardiogram and stress test planned by primary care to evaluate for heart failure symptoms.   Plan/Recommendations: CT follow-up in 1 year.  Phyllis Breeze MD [facility] 04/25/2024, 11:22 AM  CC: Richard Knuckles, MD

## 2024-05-08 ENCOUNTER — Other Ambulatory Visit: Payer: Self-pay | Admitting: Internal Medicine

## 2024-05-08 DIAGNOSIS — R7989 Other specified abnormal findings of blood chemistry: Secondary | ICD-10-CM

## 2024-05-08 DIAGNOSIS — R600 Localized edema: Secondary | ICD-10-CM

## 2024-05-08 DIAGNOSIS — R0609 Other forms of dyspnea: Secondary | ICD-10-CM

## 2024-05-08 NOTE — Progress Notes (Signed)
  Electrophysiology Office Note:   Date:  05/09/2024  ID:  HAMILTON MARINELLO, DOB 1946-03-09, MRN [id number]  Primary Cardiologist: Danelle Birmingham, MD Electrophysiologist: Danelle Birmingham, MD      History of Present Illness:   Richard Blake is a 78 y.o. male with h/o permanent AF, HT, and Chronic diastolic CHF  seen today for routine electrophysiology followup.   Since last being seen in our clinic the patient reports doing well from a cardiac perspective. Had some SOB last time he saw Dr. Joshua prompting echo and myoview. Pt states today it was really just that day, and hasn't noticed any since.  Walked from the parking lot today without difficulty. Otherwise, he denies chest pain, palpitations, dyspnea, PND, orthopnea, nausea, vomiting, dizziness, syncope, edema, weight gain, or early satiety.   Review of systems complete and found to be negative unless listed in HPI.   EP Information / Studies Reviewed:    EKG is not ordered today. EKG from 04/16/2024 reviewed which showed AF at 60 bpm       Arrhythmia/Device History No specialty comments available.   Physical Exam:   VS:  BP (!) 140/78   Pulse 64   Ht 5' 5 (1.651 m)   Wt 174 lb 12.8 oz (79.3 kg)   SpO2 98%   BMI 29.09 kg/m    Wt Readings from Last 3 Encounters:  05/09/24 174 lb 12.8 oz (79.3 kg)  04/25/24 170 lb (77.1 kg)  04/16/24 171 lb (77.6 kg)     GEN: No acute distress NECK: No JVD; No carotid bruits CARDIAC: Regular rate and rhythm, no murmurs, rubs, gallops RESPIRATORY:  Clear to auscultation without rales, wheezing or rhonchi  ABDOMEN: Soft, non-tender, non-distended EXTREMITIES:  No edema; No deformity   ASSESSMENT AND PLAN:    Permanent AF Rates overall controlled Continue lopressor  25 mg daily On going since 2018+. Attempted tikosyn  at that time and failed  Pauses Asymptomatic bradycardia Noted on monitor, asymptomatic, no indication for pacing  HTN Stable on current regimen   Chronic diastolic  CHF Echo 50-55% in 2018, pending update There are concerns that recent exacerbations are 2/2 to AF, but pt has been in AF since 2018, perhaps earlier.   Follow up with EP Team in 12 months. Sooner with issues.  Signed, Ozell Prentice Passey, PA-C

## 2024-05-09 ENCOUNTER — Ambulatory Visit: Attending: Student | Admitting: Student

## 2024-05-09 ENCOUNTER — Encounter: Payer: Self-pay | Admitting: Student

## 2024-05-09 VITALS — BP 140/78 | HR 64 | Ht 65.0 in | Wt 174.8 lb

## 2024-05-09 DIAGNOSIS — I4819 Other persistent atrial fibrillation: Secondary | ICD-10-CM | POA: Insufficient documentation

## 2024-05-09 DIAGNOSIS — I5032 Chronic diastolic (congestive) heart failure: Secondary | ICD-10-CM | POA: Diagnosis not present

## 2024-05-09 DIAGNOSIS — I10 Essential (primary) hypertension: Secondary | ICD-10-CM | POA: Diagnosis not present

## 2024-05-09 NOTE — Patient Instructions (Signed)
 Medication Instructions:  Your physician recommends that you continue on your current medications as directed. Please refer to the Current Medication list given to you today.  *If you need a refill on your cardiac medications before your next appointment, please call your pharmacy*  Lab Work: None ordered If you have labs (blood work) drawn today and your tests are completely normal, you will receive your results only by: MyChart Message (if you have MyChart) OR A paper copy in the mail If you have any lab test that is abnormal or we need to change your treatment, we will call you to review the results.  Follow-Up: At [facility], you and your health needs are our priority.  As part of our continuing mission to provide you with exceptional heart care, our providers are all part of one team.  This team includes your primary Cardiologist (physician) and Advanced Practice Providers or APPs (Physician Assistants and Nurse Practitioners) who all work together to provide you with the care you need, when you need it.  Your next appointment:   1 year(s)  Provider:   You will see one of the following Advanced Practice Providers on your designated Care Team:   Mertha Abrahams, Kennard Pea [address] [place], PA-C Suzann Riddle, NP Creighton Doffing, NP

## 2024-05-27 ENCOUNTER — Telehealth (HOSPITAL_COMMUNITY): Payer: Self-pay

## 2024-05-27 ENCOUNTER — Other Ambulatory Visit: Payer: Self-pay | Admitting: Internal Medicine

## 2024-05-27 ENCOUNTER — Other Ambulatory Visit: Payer: Self-pay | Admitting: Family

## 2024-05-27 DIAGNOSIS — I10 Essential (primary) hypertension: Secondary | ICD-10-CM

## 2024-05-27 DIAGNOSIS — M1A39X0 Chronic gout due to renal impairment, multiple sites, without tophus (tophi): Secondary | ICD-10-CM

## 2024-05-27 DIAGNOSIS — I4819 Other persistent atrial fibrillation: Secondary | ICD-10-CM

## 2024-05-27 NOTE — Telephone Encounter (Signed)
 Detailed instructions left on the patient's answering machine. CCT

## 2024-05-31 ENCOUNTER — Other Ambulatory Visit: Payer: Self-pay | Admitting: Internal Medicine

## 2024-05-31 DIAGNOSIS — I10 Essential (primary) hypertension: Secondary | ICD-10-CM

## 2024-06-03 ENCOUNTER — Other Ambulatory Visit: Payer: Self-pay

## 2024-06-03 ENCOUNTER — Ambulatory Visit (HOSPITAL_COMMUNITY)
Admission: RE | Admit: 2024-06-03 | Discharge: 2024-06-03 | Disposition: A | Discharge status: Home / Self Care | Source: Ambulatory Visit | Attending: Cardiology | Admitting: Cardiology

## 2024-06-03 ENCOUNTER — Ambulatory Visit (HOSPITAL_BASED_OUTPATIENT_CLINIC_OR_DEPARTMENT_OTHER)
Admission: RE | Admit: 2024-06-03 | Discharge: 2024-06-03 | Disposition: A | Discharge status: Home / Self Care | Source: Ambulatory Visit | Attending: Internal Medicine | Admitting: Internal Medicine

## 2024-06-03 DIAGNOSIS — R600 Localized edema: Secondary | ICD-10-CM | POA: Diagnosis not present

## 2024-06-03 DIAGNOSIS — R0609 Other forms of dyspnea: Secondary | ICD-10-CM | POA: Insufficient documentation

## 2024-06-03 DIAGNOSIS — R7989 Other specified abnormal findings of blood chemistry: Secondary | ICD-10-CM | POA: Insufficient documentation

## 2024-06-03 LAB — MYOCARDIAL PERFUSION IMAGING
Base ST Depression (mm): 0 mm
LV dias vol: 107 mL (ref 62–150)
LV sys vol: 57 mL (ref 4.2–5.8)
Nuc Stress EF: 46 %
Peak HR: 65 {beats}/min
Rest HR: 54 {beats}/min
Rest Nuclear Isotope Dose: 10.2 mCi
SDS: 2
SRS: 2
SSS: 4
ST Depression (mm): 0 mm
Stress Nuclear Isotope Dose: 32.6 mCi
TID: 1.16

## 2024-06-03 LAB — ECHOCARDIOGRAM COMPLETE
P 1/2 time: 673 ms
S' Lateral: 2.8 cm

## 2024-06-03 MED ORDER — REGADENOSON 0.4 MG/5ML IV SOLN
INTRAVENOUS | Status: AC
  Filled 2024-06-03: qty 5

## 2024-06-03 MED ORDER — TECHNETIUM TC 99M TETROFOSMIN IV KIT
32.6000 | PACK | Freq: Once | INTRAVENOUS | Status: AC | PRN
Start: 2024-06-03 — End: 2024-06-03
  Administered 2024-06-03: 32.6 via INTRAVENOUS

## 2024-06-03 MED ORDER — TECHNETIUM TC 99M TETROFOSMIN IV KIT
10.2000 | PACK | Freq: Once | INTRAVENOUS | Status: AC | PRN
  Administered 2024-06-03: 10.2 via INTRAVENOUS

## 2024-06-03 MED ORDER — REGADENOSON 0.4 MG/5ML IV SOLN
0.4000 mg | Freq: Once | INTRAVENOUS | Status: AC
  Administered 2024-06-03: 0.4 mg via INTRAVENOUS

## 2024-06-10 DIAGNOSIS — M9905 Segmental and somatic dysfunction of pelvic region: Secondary | ICD-10-CM | POA: Diagnosis not present

## 2024-06-10 DIAGNOSIS — M5136 Other intervertebral disc degeneration, lumbar region with discogenic back pain only: Secondary | ICD-10-CM | POA: Diagnosis not present

## 2024-06-10 DIAGNOSIS — M9904 Segmental and somatic dysfunction of sacral region: Secondary | ICD-10-CM | POA: Diagnosis not present

## 2024-06-10 DIAGNOSIS — M9903 Segmental and somatic dysfunction of lumbar region: Secondary | ICD-10-CM | POA: Diagnosis not present

## 2024-07-03 DIAGNOSIS — N183 Chronic kidney disease, stage 3 unspecified: Secondary | ICD-10-CM | POA: Diagnosis not present

## 2024-07-03 DIAGNOSIS — M109 Gout, unspecified: Secondary | ICD-10-CM | POA: Diagnosis not present

## 2024-07-03 DIAGNOSIS — D509 Iron deficiency anemia, unspecified: Secondary | ICD-10-CM | POA: Diagnosis not present

## 2024-07-03 DIAGNOSIS — I129 Hypertensive chronic kidney disease with stage 1 through stage 4 chronic kidney disease, or unspecified chronic kidney disease: Secondary | ICD-10-CM | POA: Diagnosis not present

## 2024-07-03 DIAGNOSIS — I480 Paroxysmal atrial fibrillation: Secondary | ICD-10-CM | POA: Diagnosis not present

## 2024-07-04 LAB — LAB REPORT - SCANNED: EGFR: 40

## 2024-07-22 DIAGNOSIS — Z23 Encounter for immunization: Secondary | ICD-10-CM | POA: Diagnosis not present

## 2024-09-10 ENCOUNTER — Other Ambulatory Visit: Payer: Self-pay | Admitting: Internal Medicine

## 2024-09-10 DIAGNOSIS — I4819 Other persistent atrial fibrillation: Secondary | ICD-10-CM

## 2024-09-10 DIAGNOSIS — I10 Essential (primary) hypertension: Secondary | ICD-10-CM

## 2024-09-10 DIAGNOSIS — M1A39X0 Chronic gout due to renal impairment, multiple sites, without tophus (tophi): Secondary | ICD-10-CM

## 2024-09-26 DIAGNOSIS — Z23 Encounter for immunization: Secondary | ICD-10-CM | POA: Diagnosis not present

## 2024-12-24 ENCOUNTER — Ambulatory Visit: Admitting: Internal Medicine

## 2025-04-23 ENCOUNTER — Ambulatory Visit

## 2025-04-23 ENCOUNTER — Ambulatory Visit: Admitting: Internal Medicine

## 2025-04-24 ENCOUNTER — Other Ambulatory Visit

## 2025-05-08 ENCOUNTER — Ambulatory Visit: Admitting: Student
